# Patient Record
Sex: Male | Born: 1955
Health system: Southern US, Community
[De-identification: ages and names within clinical notes are randomized; demographics above are authoritative.]

## PROBLEM LIST (undated history)

## (undated) DIAGNOSIS — R51 Headache: Secondary | ICD-10-CM

## (undated) DIAGNOSIS — I10 Essential (primary) hypertension: Secondary | ICD-10-CM

## (undated) DIAGNOSIS — C61 Malignant neoplasm of prostate: Secondary | ICD-10-CM

## (undated) DIAGNOSIS — F32A Depression, unspecified: Secondary | ICD-10-CM

## (undated) DIAGNOSIS — J449 Chronic obstructive pulmonary disease, unspecified: Secondary | ICD-10-CM

## (undated) DIAGNOSIS — M1990 Unspecified osteoarthritis, unspecified site: Secondary | ICD-10-CM

## (undated) DIAGNOSIS — R519 Headache, unspecified: Secondary | ICD-10-CM

## (undated) DIAGNOSIS — R7303 Prediabetes: Secondary | ICD-10-CM

## (undated) DIAGNOSIS — I739 Peripheral vascular disease, unspecified: Secondary | ICD-10-CM

## (undated) DIAGNOSIS — G709 Myoneural disorder, unspecified: Secondary | ICD-10-CM

## (undated) DIAGNOSIS — F329 Major depressive disorder, single episode, unspecified: Secondary | ICD-10-CM

## (undated) HISTORY — PX: DIAGNOSTIC LAPAROSCOPY: SUR761

## (undated) HISTORY — PX: WRIST SURGERY: SHX841

## (undated) HISTORY — PX: BACK SURGERY: SHX140

## (undated) HISTORY — PX: APPENDECTOMY: SHX54

---

## 1998-04-26 ENCOUNTER — Encounter: Admission: RE | Admit: 1998-04-26 | Discharge: 1998-07-25 | Payer: Self-pay | Admitting: Anesthesiology

## 1998-05-29 ENCOUNTER — Encounter: Admission: RE | Admit: 1998-05-29 | Discharge: 1998-08-27 | Payer: Self-pay | Admitting: Anesthesiology

## 1998-08-09 ENCOUNTER — Encounter: Admission: RE | Admit: 1998-08-09 | Discharge: 1998-10-27 | Payer: Self-pay | Admitting: Anesthesiology

## 1998-10-27 ENCOUNTER — Encounter: Admission: RE | Admit: 1998-10-27 | Discharge: 1999-01-25 | Payer: Self-pay | Admitting: Anesthesiology

## 1999-01-30 ENCOUNTER — Encounter: Admission: RE | Admit: 1999-01-30 | Discharge: 1999-04-30 | Payer: Self-pay | Admitting: Anesthesiology

## 1999-06-07 ENCOUNTER — Encounter: Admission: RE | Admit: 1999-06-07 | Discharge: 1999-09-03 | Payer: Self-pay | Admitting: Anesthesiology

## 1999-09-03 ENCOUNTER — Encounter: Admission: RE | Admit: 1999-09-03 | Discharge: 1999-12-02 | Payer: Self-pay | Admitting: Anesthesiology

## 1999-12-25 ENCOUNTER — Encounter: Admission: RE | Admit: 1999-12-25 | Discharge: 2000-03-17 | Payer: Self-pay | Admitting: Anesthesiology

## 2000-03-17 ENCOUNTER — Encounter: Admission: RE | Admit: 2000-03-17 | Discharge: 2000-04-28 | Payer: Self-pay | Admitting: Anesthesiology

## 2000-06-09 ENCOUNTER — Encounter: Admission: RE | Admit: 2000-06-09 | Discharge: 2000-09-07 | Payer: Self-pay | Admitting: Anesthesiology

## 2000-09-17 ENCOUNTER — Encounter: Admission: RE | Admit: 2000-09-17 | Discharge: 2000-12-16 | Payer: Self-pay | Admitting: Anesthesiology

## 2001-01-14 ENCOUNTER — Encounter: Admission: RE | Admit: 2001-01-14 | Discharge: 2001-04-14 | Payer: Self-pay | Admitting: Anesthesiology

## 2001-06-03 ENCOUNTER — Encounter: Admission: RE | Admit: 2001-06-03 | Discharge: 2001-06-13 | Payer: Self-pay | Admitting: Anesthesiology

## 2002-11-29 ENCOUNTER — Encounter: Payer: Self-pay | Admitting: Occupational Medicine

## 2002-11-29 ENCOUNTER — Encounter: Admission: RE | Admit: 2002-11-29 | Discharge: 2002-11-29 | Payer: Self-pay | Admitting: Occupational Medicine

## 2011-07-02 ENCOUNTER — Other Ambulatory Visit: Payer: Self-pay | Admitting: Gastroenterology

## 2011-07-02 ENCOUNTER — Ambulatory Visit (HOSPITAL_COMMUNITY)
Admission: RE | Admit: 2011-07-02 | Discharge: 2011-07-02 | Disposition: A | Discharge status: Home / Self Care | Payer: 59 | Source: Ambulatory Visit | Attending: Gastroenterology | Admitting: Gastroenterology

## 2011-07-02 DIAGNOSIS — Z884 Allergy status to anesthetic agent status: Secondary | ICD-10-CM | POA: Insufficient documentation

## 2011-07-02 DIAGNOSIS — D126 Benign neoplasm of colon, unspecified: Secondary | ICD-10-CM | POA: Insufficient documentation

## 2011-07-02 DIAGNOSIS — Z79899 Other long term (current) drug therapy: Secondary | ICD-10-CM | POA: Insufficient documentation

## 2011-07-02 DIAGNOSIS — Z1211 Encounter for screening for malignant neoplasm of colon: Secondary | ICD-10-CM | POA: Insufficient documentation

## 2011-07-14 NOTE — Op Note (Signed)
  NAME:  Tyrone [name], KARIM NO.:  [id number]  MEDICAL RECORD NO.:  [id number]  LOCATION:  [facility]                         FACILITY:  [facility]  PHYSICIAN:  Danise Edge, M.D.   DATE OF BIRTH:  January 05, 1956  DATE OF PROCEDURE:  07/02/2011 DATE OF DISCHARGE:                              OPERATIVE REPORT   REFERRING PHYSICIAN:  Georgann Housekeeper, MD  PROCEDURE:  Screening colonoscopy.  HISTORY:  Mr. [name] is a 55 year old male scheduled to undergo his first screening colonoscopy with polypectomy to prevent colon cancer.  The patient is allergic to fentanyl.  He chronically takes methadone.  He is to receive propofol sedation for the procedure.  PROCEDURE IN DETAIL:  The patient was placed in the left lateral decubitus position.  Anal inspection and digital rectal exam were normal.  The Pentax pediatric colonoscope was introduced into the rectum and advanced to the cecum.  Normal-appearing ileocecal valve and appendiceal orifice were identified.  Colonic preparation for the exam today was good.  Rectum:  From the midrectum, a 1-cm pedunculated polyp was removed with the electrocautery snare and submitted for pathological interpretation. A 5-mm distal rectal polyp was removed with the cold biopsy forceps.  Sigmoid colon:  Normal.  Descending colon:  From the descending colon, two 5-mm polyps were removed with a cold snare and submitted along with the small distal rectal polyp for pathological evaluation.  Splenic flexure:  Normal.  Transverse colon:  Normal.  Hepatic flexure:  Normal.  Ascending colon:  Normal.  Cecum and ileocecal valve:  Normal.  ASSESSMENT: 1. A 1-cm pedunculated polyp was removed from the midrectum with the     hot snare. 2. A diminutive distal rectal polyp and 2 diminutive descending colon     polyps were removed with the cold snare and submitted in one bottle     for pathological evaluation.     ______________________________ Danise Edge, M.D.     MJ/MEDQ  D:  07/02/2011  T:  07/02/2011  Job:  [id number]  cc:   Georgann Housekeeper, MD Fax: [phone]  Electronically Signed by Danise Edge M.D. on 07/14/2011 01:43:59 PM

## 2013-06-24 ENCOUNTER — Emergency Department (HOSPITAL_COMMUNITY)
Admission: EM | Admit: 2013-06-24 | Discharge: 2013-06-25 | Disposition: A | Discharge status: Home / Self Care | Payer: Managed Care, Other (non HMO) | Attending: Emergency Medicine | Admitting: Emergency Medicine

## 2013-06-24 ENCOUNTER — Encounter (HOSPITAL_COMMUNITY): Payer: Self-pay | Admitting: *Deleted

## 2013-06-24 DIAGNOSIS — IMO0002 Reserved for concepts with insufficient information to code with codable children: Secondary | ICD-10-CM | POA: Insufficient documentation

## 2013-06-24 DIAGNOSIS — F17200 Nicotine dependence, unspecified, uncomplicated: Secondary | ICD-10-CM | POA: Insufficient documentation

## 2013-06-24 DIAGNOSIS — R000 Tachycardia, unspecified: Secondary | ICD-10-CM | POA: Insufficient documentation

## 2013-06-24 DIAGNOSIS — S90861A Insect bite (nonvenomous), right foot, initial encounter: Secondary | ICD-10-CM

## 2013-06-24 DIAGNOSIS — Y9289 Other specified places as the place of occurrence of the external cause: Secondary | ICD-10-CM | POA: Insufficient documentation

## 2013-06-24 DIAGNOSIS — Y9389 Activity, other specified: Secondary | ICD-10-CM | POA: Insufficient documentation

## 2013-06-24 LAB — COMPREHENSIVE METABOLIC PANEL
AST: 27 U/L (ref 0–37)
Albumin: 4.3 g/dL (ref 3.5–5.2)
Alkaline Phosphatase: 92 U/L (ref 39–117)
Chloride: 102 mEq/L (ref 96–112)
Potassium: 4.3 mEq/L (ref 3.5–5.1)
Total Bilirubin: 0.3 mg/dL (ref 0.3–1.2)
Total Protein: 7.6 g/dL (ref 6.0–8.3)

## 2013-06-24 LAB — CBC
Platelets: 289 10*3/uL (ref 150–400)
RDW: 12.7 % (ref 11.5–15.5)
WBC: 12.6 10*3/uL — ABNORMAL HIGH (ref 4.0–10.5)

## 2013-06-24 LAB — PROTIME-INR: INR: 0.96 (ref 0.00–1.49)

## 2013-06-24 LAB — D-DIMER, QUANTITATIVE: D-Dimer, Quant: 0.37 ug/mL-FEU (ref 0.00–0.48)

## 2013-06-24 MED ORDER — HYDROMORPHONE HCL PF 1 MG/ML IJ SOLN
1.0000 mg | Freq: Once | INTRAMUSCULAR | Status: AC
  Administered 2013-06-24: 1 mg via INTRAVENOUS
  Filled 2013-06-24: qty 1

## 2013-06-24 MED ORDER — HYDROMORPHONE HCL PF 1 MG/ML IJ SOLN
1.0000 mg | INTRAMUSCULAR | Status: DC | PRN
  Administered 2013-06-24: 1 mg via INTRAVENOUS
  Filled 2013-06-24: qty 1

## 2013-06-24 MED ORDER — FENTANYL CITRATE 0.05 MG/ML IJ SOLN
100.0000 ug | Freq: Once | INTRAMUSCULAR | Status: AC
  Administered 2013-06-24: 100 ug via INTRAVENOUS
  Filled 2013-06-24: qty 2

## 2013-06-24 NOTE — ED Notes (Signed)
Pt moaning and groaning; yelling out with pain; states is becoming more severe; states took two benadryl and 800mg  motrin prior to arrival

## 2013-06-24 NOTE — ED Notes (Signed)
Pt states he thinks he was bit by a snake to right great toe about 1830; states at first thought was a bee sting but due to severe pain that has developed states thinks is a snake bite; did not see snake

## 2013-06-24 NOTE — ED Provider Notes (Signed)
CSN: [id number]     Arrival date & time 06/24/13  2053 History   First MD Initiated Contact with Patient 06/24/13 2057     No chief complaint on file.  (Consider location/radiation/quality/duration/timing/severity/associated sxs/prior Treatment) The history is provided by the patient and medical records. No language interpreter was used.    Tyrone [name] is a 57 y.o. male  with no known medical history presents to the Emergency Department complaining of acute, persistent, progressively worsening right great toe pain after being bitten at approximately 6 PM. Patient states he was outside in the dark working on his son's air conditioning when he was bitten in the right great toe. He states he was wearing sandals. When he looked there was one lesion on the medial, distal portion of the toe. He states it was painful at the time.  He reports he never saw what bit him.  She did not see a snake but is concerned about it. Associated symptoms include erythema of the distal portion of the great toe.  No makes it better and ice, palpation, walking makes it worse.  Pt denies fever, chills, headache, neck pain, chest pain, shortness of breath, abdominal pain, nausea, vomiting, diarrhea, weakness, dizziness, cramping, dysuria, hematuria.     History reviewed. No pertinent past medical history. Past Surgical History  Procedure Laterality Date  . Wrist surgery     No family history on file. History  Substance Use Topics  . Smoking status: Current Every Day Smoker -- 1.50 packs/day    Types: Cigarettes  . Smokeless tobacco: Not on file  . Alcohol Use: Not on file    Review of Systems  Constitutional: Negative for fever, diaphoresis, appetite change, fatigue and unexpected weight change.  HENT: Negative for mouth sores and neck stiffness.   Eyes: Negative for visual disturbance.  Respiratory: Negative for cough, chest tightness, shortness of breath and wheezing.   Cardiovascular: Negative for chest  pain.  Gastrointestinal: Negative for nausea, vomiting, abdominal pain, diarrhea and constipation.  Endocrine: Negative for polydipsia, polyphagia and polyuria.  Genitourinary: Negative for dysuria, urgency, frequency and hematuria.  Musculoskeletal: Positive for myalgias. Negative for back pain.  Skin: Positive for wound. Negative for rash.  Allergic/Immunologic: Negative for immunocompromised state.  Neurological: Negative for syncope, light-headedness and headaches.  Hematological: Does not bruise/bleed easily.  Psychiatric/Behavioral: Negative for sleep disturbance. The patient is not nervous/anxious.     Allergies  Neurontin  Home Medications   Current Outpatient Rx  Name  Route  Sig  Dispense  Refill  . diphenhydrAMINE (BENADRYL) 25 MG tablet   Oral   Take 50 mg by mouth every 6 (six) hours as needed for itching.          Marland Kitchen ibuprofen (ADVIL,MOTRIN) 800 MG tablet   Oral   Take 800 mg by mouth every 8 (eight) hours as needed for pain.         . methadone (DOLOPHINE) 10 MG tablet   Oral   Take 10 mg by mouth every 8 (eight) hours as needed for pain.          BP 147/73  Pulse 79  Temp(Src) 98 F (36.7 C) (Oral)  Resp 12  Ht 5\' 10"  (1.778 m)  Wt 210 lb (95.255 kg)  BMI 30.13 kg/m2  SpO2 96% Physical Exam  Nursing note and vitals reviewed. Constitutional: He appears well-developed and well-nourished. No distress.  Awake, alert, nontoxic appearance Pt screaming in pain  HENT:  Head: Normocephalic and atraumatic.  Mouth/Throat: Oropharynx is clear and moist. No oropharyngeal exudate.  Eyes: Conjunctivae are normal. Pupils are equal, round, and reactive to light. No scleral icterus.  Neck: Normal range of motion. Neck supple.  Cardiovascular: Regular rhythm, normal heart sounds and intact distal pulses.   No murmur heard. Tachycardia   Pulmonary/Chest: Effort normal and breath sounds normal. No respiratory distress. He has no wheezes.  Abdominal: Soft. Bowel  sounds are normal. He exhibits no distension. There is no tenderness. There is no rebound. Hernia confirmed negative in the right inguinal area and confirmed negative in the left inguinal area.  Genitourinary:  No tenderness of the groin No adenopathy  Musculoskeletal: Normal range of motion. He exhibits no edema.  No swelling of the foot, ankle, calf, thigh No pain in the groin  Lymphadenopathy:    He has no cervical adenopathy.       Right: No inguinal adenopathy present.       Left: No inguinal adenopathy present.  Neurological: He is alert. No cranial nerve deficit. He exhibits normal muscle tone. Coordination normal.  Speech is clear and goal oriented Moves extremities without ataxia  Skin: Skin is warm and dry. He is not diaphoretic. There is erythema.  Mild erythema and swelling of the right great toe Questionable one small lesion to the medial distal aspect of the right great toe just medial to the nail No evidence of duplicate punctures Lesion is not indurated or using  Psychiatric: He has a normal mood and affect.    ED Course  Procedures (including critical care time) Labs Review Labs Reviewed  CBC - Abnormal; Notable for the following:    WBC 12.6 (*)    MCHC 36.1 (*)    All other components within normal limits  COMPREHENSIVE METABOLIC PANEL  PROTIME-INR  APTT  D-DIMER, QUANTITATIVE   Imaging Review No results found.  MDM   1. Insect bite of right foot with local reaction      Tyrone [name] presents with questionable snake bite.  Will give pain control, check labs, measure and repeat.    9:09 PM Foot - 9.5 in Ankle - 9 in Calf - 14.25 in Thigh - 19 in Groin Tenderness? no Wound still oozing? No  10:15PM Foot - 9.5 in Ankle - 9 in Calf - 14.25 in Thigh - 19 in Groin Tenderness? no Wound still oozing? No  11:25 Foot - 9.5 in Ankle - 9 in Calf - 14.25 in Thigh - 19 in Groin Tenderness? no Wound still oozing? No  12:50 AM Foot - 9.5  in Ankle - 9 in Calf - 14.25 in Thigh - 19 in Groin Tenderness? no Wound still oozing? No  Patient without extending erythema, swelling or pain beyond the initial site. Highly doubt snake bite at this point.  Patient has had Dilaudid 2 mg, fentanyl 200 mcg. Patient with mildly elevated white blood cell count of 12.6. All the labs are unremarkable. No evidence of abscess or infection.  Tetanus updated.  Patient will be discharged home with pain medicine and antibiotics.  Wound care instructions given.  Pt given instructions to elevate the extremity.  He is to followup with his primary care provider this week.  I have also discussed reasons to return immediately to the ER.  Patient expresses understanding and agrees with plan.  It has been determined that no acute conditions requiring further emergency intervention are present at this time. The patient/guardian have been advised of the diagnosis and plan. We have  discussed signs and symptoms that warrant return to the ED, such as changes or worsening in symptoms.   Vital signs are stable at discharge.   BP 147/73  Pulse 79  Temp(Src) 98 F (36.7 C) (Oral)  Resp 12  Ht 5\' 10"  (1.778 m)  Wt 210 lb (95.255 kg)  BMI 30.13 kg/m2  SpO2 96%  Patient/guardian has voiced understanding and agreed to follow-up with the PCP or specialist.       Dierdre Forth, PA-C 06/25/13 0111

## 2013-06-25 MED ORDER — OXYCODONE-ACETAMINOPHEN 5-325 MG PO TABS: 2.0000 | ORAL_TABLET | ORAL | Status: DC | PRN

## 2013-06-25 MED ORDER — FENTANYL CITRATE 0.05 MG/ML IJ SOLN
100.0000 ug | Freq: Once | INTRAMUSCULAR | Status: AC
  Administered 2013-06-25: 100 ug via INTRAVENOUS
  Filled 2013-06-25: qty 2

## 2013-06-25 MED ORDER — TETANUS-DIPHTH-ACELL PERTUSSIS 5-2.5-18.5 LF-MCG/0.5 IM SUSP
0.5000 mL | Freq: Once | INTRAMUSCULAR | Status: AC
  Administered 2013-06-25: 0.5 mL via INTRAMUSCULAR
  Filled 2013-06-25: qty 0.5

## 2013-06-29 NOTE — ED Provider Notes (Signed)
Medical screening examination/treatment/procedure(s) were performed by non-physician practitioner and as supervising physician I was immediately available for consultation/collaboration.  Lorita Forinash R. Corene Resnick, MD 06/29/13 0703 

## 2015-11-30 ENCOUNTER — Other Ambulatory Visit: Payer: Self-pay | Admitting: Internal Medicine

## 2015-11-30 ENCOUNTER — Ambulatory Visit
Admission: RE | Admit: 2015-11-30 | Discharge: 2015-11-30 | Disposition: A | Discharge status: Home / Self Care | Payer: Managed Care, Other (non HMO) | Source: Ambulatory Visit | Attending: Internal Medicine | Admitting: Internal Medicine

## 2015-11-30 DIAGNOSIS — R0781 Pleurodynia: Secondary | ICD-10-CM

## 2016-01-03 ENCOUNTER — Other Ambulatory Visit: Payer: Self-pay | Admitting: Gastroenterology

## 2016-01-29 ENCOUNTER — Encounter (HOSPITAL_COMMUNITY): Payer: Self-pay | Admitting: *Deleted

## 2016-02-06 ENCOUNTER — Ambulatory Visit (HOSPITAL_COMMUNITY): Payer: Managed Care, Other (non HMO) | Admitting: Anesthesiology

## 2016-02-06 ENCOUNTER — Encounter (HOSPITAL_COMMUNITY): Payer: Self-pay

## 2016-02-06 ENCOUNTER — Ambulatory Visit (HOSPITAL_COMMUNITY)
Admission: RE | Admit: 2016-02-06 | Discharge: 2016-02-06 | Disposition: A | Discharge status: Home / Self Care | Payer: Managed Care, Other (non HMO) | Source: Ambulatory Visit | Attending: Gastroenterology | Admitting: Gastroenterology

## 2016-02-06 ENCOUNTER — Encounter (HOSPITAL_COMMUNITY)
Admission: RE | Disposition: A | Discharge status: Home / Self Care | Payer: Self-pay | Source: Ambulatory Visit | Attending: Gastroenterology

## 2016-02-06 DIAGNOSIS — Z1211 Encounter for screening for malignant neoplasm of colon: Secondary | ICD-10-CM | POA: Diagnosis present

## 2016-02-06 DIAGNOSIS — I10 Essential (primary) hypertension: Secondary | ICD-10-CM | POA: Insufficient documentation

## 2016-02-06 DIAGNOSIS — F17210 Nicotine dependence, cigarettes, uncomplicated: Secondary | ICD-10-CM | POA: Insufficient documentation

## 2016-02-06 DIAGNOSIS — G4733 Obstructive sleep apnea (adult) (pediatric): Secondary | ICD-10-CM | POA: Insufficient documentation

## 2016-02-06 DIAGNOSIS — Z86010 Personal history of colonic polyps: Secondary | ICD-10-CM | POA: Diagnosis not present

## 2016-02-06 DIAGNOSIS — K621 Rectal polyp: Secondary | ICD-10-CM | POA: Diagnosis not present

## 2016-02-06 DIAGNOSIS — Z79899 Other long term (current) drug therapy: Secondary | ICD-10-CM | POA: Insufficient documentation

## 2016-02-06 DIAGNOSIS — E7800 Pure hypercholesterolemia, unspecified: Secondary | ICD-10-CM | POA: Diagnosis not present

## 2016-02-06 DIAGNOSIS — D124 Benign neoplasm of descending colon: Secondary | ICD-10-CM | POA: Diagnosis not present

## 2016-02-06 HISTORY — DX: Major depressive disorder, single episode, unspecified: F32.9

## 2016-02-06 HISTORY — DX: Depression, unspecified: F32.A

## 2016-02-06 HISTORY — DX: Essential (primary) hypertension: I10

## 2016-02-06 HISTORY — DX: Headache, unspecified: R51.9

## 2016-02-06 HISTORY — DX: Headache: R51

## 2016-02-06 HISTORY — PX: COLONOSCOPY WITH PROPOFOL: SHX5780

## 2016-02-06 SURGERY — COLONOSCOPY WITH PROPOFOL
Anesthesia: Monitor Anesthesia Care

## 2016-02-06 MED ORDER — LIDOCAINE HCL (CARDIAC) 20 MG/ML IV SOLN
INTRAVENOUS | Status: AC
Start: 2016-02-06 — End: 2016-02-06
  Filled 2016-02-06: qty 5

## 2016-02-06 MED ORDER — PROPOFOL 10 MG/ML IV BOLUS
INTRAVENOUS | Status: DC | PRN
  Administered 2016-02-06: 10 mg via INTRAVENOUS
  Administered 2016-02-06: 30 mg via INTRAVENOUS
  Administered 2016-02-06 (×3): 20 mg via INTRAVENOUS

## 2016-02-06 MED ORDER — PROPOFOL 10 MG/ML IV BOLUS
INTRAVENOUS | Status: AC
  Filled 2016-02-06: qty 60

## 2016-02-06 MED ORDER — PROPOFOL 500 MG/50ML IV EMUL
INTRAVENOUS | Status: DC | PRN
  Administered 2016-02-06: 150 ug/kg/min via INTRAVENOUS

## 2016-02-06 MED ORDER — SODIUM CHLORIDE 0.9 % IV SOLN: INTRAVENOUS | Status: DC

## 2016-02-06 MED ORDER — LACTATED RINGERS IV SOLN
INTRAVENOUS | Status: DC
  Administered 2016-02-06: 1000 mL via INTRAVENOUS

## 2016-02-06 MED ORDER — LIDOCAINE HCL (CARDIAC) 20 MG/ML IV SOLN
INTRAVENOUS | Status: DC | PRN
  Administered 2016-02-06: 25 mg via INTRATRACHEAL

## 2016-02-06 SURGICAL SUPPLY — 22 items

## 2016-02-06 NOTE — H&P (Signed)
  Procedure: Surveillance colonoscopy. 2012 colonoscopy was performed with removal of a 1 cm tubulovillous adenomatous rectal polyp  History: The patient is a 60 year old male born [birth year]. He is scheduled to undergo a surveillance colonoscopy today.  Past medical history: Mild obstructive sleep apnea syndrome. Hypertension. Hypercholesterolemia. Migraine headache syndrome. Lumbar laminectomy. 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  Medication allergies: Fentanyl  Exam: The patient is alert and lying comfortably on the endoscopy stretcher. Abdomen is soft and nontender to palpation. Lungs are clear to auscultation. Cardiac exam reveals a regular rhythm.  Plan: Proceed with surveillance colonoscopy

## 2016-02-06 NOTE — Anesthesia Postprocedure Evaluation (Signed)
Anesthesia Post Note  Patient: Tyrone [name]  Procedure(s) Performed: Procedure(s) (LRB): COLONOSCOPY WITH PROPOFOL (N/A)  Patient location during evaluation: PACU Anesthesia Type: MAC Level of consciousness: awake and alert Pain management: pain level controlled Vital Signs Assessment: post-procedure vital signs reviewed and stable Respiratory status: spontaneous breathing, nonlabored ventilation, respiratory function stable and patient connected to nasal cannula oxygen Cardiovascular status: stable and blood pressure returned to baseline Anesthetic complications: no    Last Vitals:  Filed Vitals:   02/06/16 1031 02/06/16 1041  BP: 123/66 133/61  Pulse: 73 73  Temp:    Resp: 17 17    Last Pain: There were no vitals filed for this visit.               Raksha Wolfgang J

## 2016-02-06 NOTE — Anesthesia Preprocedure Evaluation (Addendum)
Anesthesia Evaluation  Patient identified by MRN, date of birth, ID band Patient awake    Reviewed: Allergy & Precautions, NPO status , Patient's Chart, lab work & pertinent test results  Airway Mallampati: II  TM Distance: >3 FB Neck ROM: Full    Dental no notable dental hx.    Pulmonary Current Smoker,    Pulmonary exam normal breath sounds clear to auscultation       Cardiovascular hypertension, Pt. on medications Normal cardiovascular exam Rhythm:Regular Rate:Normal     Neuro/Psych  Headaches, PSYCHIATRIC DISORDERS Depression    GI/Hepatic negative GI ROS, Neg liver ROS,   Endo/Other  negative endocrine ROS  Renal/GU negative Renal ROS  negative genitourinary   Musculoskeletal negative musculoskeletal ROS (+)   Abdominal   Peds negative pediatric ROS (+)  Hematology negative hematology ROS (+)   Anesthesia Other Findings   Reproductive/Obstetrics negative OB ROS                             Anesthesia Physical Anesthesia Plan  ASA: II  Anesthesia Plan: MAC   Post-op Pain Management:    Induction: Intravenous  Airway Management Planned: Natural Airway  Additional Equipment:   Intra-op Plan:   Post-operative Plan:   Informed Consent: I have reviewed the patients History and Physical, chart, labs and discussed the procedure including the risks, benefits and alternatives for the proposed anesthesia with the patient or authorized representative who has indicated his/her understanding and acceptance.   Dental advisory given  Plan Discussed with: CRNA  Anesthesia Plan Comments:         Anesthesia Quick Evaluation

## 2016-02-06 NOTE — Discharge Instructions (Signed)
Colonoscopy °A colonoscopy is an exam to look at your colon. This exam can help find lumps (tumors), growths (polyps), bleeding, and redness and puffiness (inflammation) in your colon.  °BEFORE THE PROCEDURE °· Ask your doctor about changing or stopping your regular medicines. °· You may need to drink a large amount of a special liquid (oral bowel prep). You start drinking this the day before your procedure. It will cause you to have watery poop (stool). This cleans out your colon. °· Do not eat or drink anything else once you have started the bowel prep, unless your doctor tells you it is safe to do so. °· Make plans for someone to drive you home after the procedure. °PROCEDURE °· You will be given medicine to help you relax (sedative). °· You will lie on your side with your knees bent. °· A tube with a camera on the end is put in the opening of your butt (anus) and into your colon. Pictures are sent to a computer screen. Your doctor will look for anything that is not normal. °· Your doctor may take a tissue sample (biopsy) from your colon to be looked at more closely. °· The exam is finished when your doctor has viewed all of the colon. °AFTER THE PROCEDURE °· Do not drive for 24 hours after the exam. °· You may have a small amount of blood in your poop. This is normal. °· You may pass gas and have belly (abdominal) cramps. This is normal. °· Ask when your test results will be ready. Make sure you get your test results. °  °This information is not intended to replace advice given to you by your health care provider. Make sure you discuss any questions you have with your health care provider. °  °Document Released: 11/02/2010 Document Revised: 10/05/2013 Document Reviewed: 06/07/2013 °Elsevier Interactive Patient Education ©2016 [employer] ° °

## 2016-02-06 NOTE — Transfer of Care (Signed)
Immediate Anesthesia Transfer of Care Note  Patient: Tyrone [name]  Procedure(s) Performed: Procedure(s): COLONOSCOPY WITH PROPOFOL (N/A)  Patient Location: PACU and Endoscopy Unit  Anesthesia Type:MAC  Level of Consciousness: awake, alert , oriented and patient cooperative  Airway & Oxygen Therapy: Patient Spontanous Breathing and Patient connected to face mask oxygen  Post-op Assessment: Report given to RN and Post -op Vital signs reviewed and stable  Post vital signs: Reviewed and stable  Last Vitals:  Filed Vitals:   02/06/16 0904  BP: 173/73  Pulse: 85  Temp: 36.8 C  Resp: 15    Complications: No apparent anesthesia complications

## 2016-02-06 NOTE — Op Note (Signed)
[facility] Patient Name: Tyrone [name] Procedure Date: 02/06/2016 MRN: [id number] Attending MD: Garlan Fair , MD Date of Birth: 1955/11/03 CSN:  Age: 60 Admit Type: Outpatient Procedure:                Colonoscopy Indications:              High risk colon cancer surveillance: Personal                            history of adenoma with villous component Providers:                Garlan Fair, MD, Sarah Monday, RN, Damontae Dalton, Technician Referring MD:              Medicines:                Propofol per Anesthesia Complications:            No immediate complications. Estimated Blood Loss:     Estimated blood loss: none. Procedure:                Pre-Anesthesia Assessment:                           - Prior to the procedure, a History and Physical                            was performed, and patient medications and                            allergies were reviewed. The patient's tolerance of                            previous anesthesia was also reviewed. The risks                            and benefits of the procedure and the sedation                            options and risks were discussed with the patient.                            All questions were answered, and informed consent                            was obtained. Prior Anticoagulants: The patient has                            taken no previous anticoagulant or antiplatelet                            agents. ASA Grade Assessment: II - A patient with  mild systemic disease. After reviewing the risks                            and benefits, the patient was deemed in                            satisfactory condition to undergo the procedure.                           After obtaining informed consent, the colonoscope                            was passed under direct vision. Throughout the                            procedure, the  patient's blood pressure, pulse, and                            oxygen saturations were monitored continuously. The                            [id number] scope was introduced through                            the anus and advanced to the the cecum, identified                            by appendiceal orifice and ileocecal valve. The                            colonoscopy was technically difficult and complex                            due to significant looping. The patient tolerated                            the procedure well. The quality of the bowel                            preparation was good. The appendiceal orifice and                            the rectum were photographed. Scope In: 9:25:27 AM Scope Out: 10:06:26 AM Scope Withdrawal Time: 0 hours 24 minutes 19 seconds  Total Procedure Duration: 0 hours 40 minutes 59 seconds  Findings:      The perianal and digital rectal examinations were normal.      A 3 mm polyp was found in the proximal descending colon. The polyp was       sessile. The polyp was removed with a cold biopsy forceps. Resection and       retrieval were complete.      A 3 mm polyp was found in the rectum. The polyp was sessile. The polyp       was removed with a cold biopsy forceps. Resection  and retrieval were       complete.      The exam was otherwise without abnormality. Impression:               - One 3 mm polyp in the proximal descending colon,                            removed with a cold biopsy forceps. Resected and                            retrieved.                           - One 3 mm polyp in the rectum, removed with a cold                            biopsy forceps. Resected and retrieved.                           - The examination was otherwise normal. Moderate Sedation:      N/A- Per Anesthesia Care Recommendation:           - Patient has a contact number available for                            emergencies. The signs and  symptoms of potential                            delayed complications were discussed with the                            patient. Return to normal activities tomorrow.                            Written discharge instructions were provided to the                            patient.                           - Repeat colonoscopy in 5 years for surveillance.                           - Resume previous diet.                           - Continue present medications. Procedure Code(s):        --- Professional ---                           [phone], Colonoscopy, flexible; with biopsy, single                            or multiple Diagnosis Code(s):        --- Professional ---  Z86.010, Personal history of colonic polyps                           D12.4, Benign neoplasm of descending colon                           K62.1, Rectal polyp CPT copyright 2016 [employer]. All rights reserved. The codes documented in this report are preliminary and upon coder review may  be revised to meet current compliance requirements. Earle Gell, MD Garlan Fair, MD 02/06/2016 10:13:13 AM This report has been signed electronically. Number of Addenda: 0

## 2016-02-07 ENCOUNTER — Encounter (HOSPITAL_COMMUNITY): Payer: Self-pay | Admitting: Gastroenterology

## 2017-10-23 ENCOUNTER — Other Ambulatory Visit: Payer: Self-pay | Admitting: Internal Medicine

## 2017-10-23 ENCOUNTER — Ambulatory Visit
Admission: RE | Admit: 2017-10-23 | Discharge: 2017-10-23 | Disposition: A | Discharge status: Home / Self Care | Payer: Managed Care, Other (non HMO) | Source: Ambulatory Visit | Attending: Internal Medicine | Admitting: Internal Medicine

## 2017-10-23 DIAGNOSIS — R05 Cough: Secondary | ICD-10-CM

## 2017-10-23 DIAGNOSIS — R059 Cough, unspecified: Secondary | ICD-10-CM

## 2018-01-26 ENCOUNTER — Other Ambulatory Visit (HOSPITAL_COMMUNITY): Payer: Self-pay | Admitting: Respiratory Therapy

## 2018-01-26 DIAGNOSIS — J449 Chronic obstructive pulmonary disease, unspecified: Secondary | ICD-10-CM

## 2018-02-02 ENCOUNTER — Ambulatory Visit (HOSPITAL_COMMUNITY)
Admission: RE | Admit: 2018-02-02 | Discharge: 2018-02-02 | Disposition: A | Discharge status: Home / Self Care | Payer: Managed Care, Other (non HMO) | Source: Ambulatory Visit | Attending: Internal Medicine | Admitting: Internal Medicine

## 2018-02-02 DIAGNOSIS — J449 Chronic obstructive pulmonary disease, unspecified: Secondary | ICD-10-CM

## 2018-02-02 LAB — PULMONARY FUNCTION TEST
DL/VA % pred: 66 %
DL/VA: 3.09 ml/min/mmHg/L
DLCO unc % pred: 54 %
DLCO unc: 17.56 ml/min/mmHg
FEF 25-75 PRE: 0.8 L/s
FEF2575-%PRED-PRE: 27 %
FEV1-%PRED-PRE: 59 %
FEV1-Pre: 2.11 L
FEV1FVC-%PRED-PRE: 76 %
FEV6-%Pred-Pre: 73 %
FEV6-Pre: 3.27 L
FEV6FVC-%Pred-Pre: 96 %
FVC-%Pred-Pre: 78 %
FVC-Pre: 3.67 L
PRE FEV1/FVC RATIO: 57 %
Pre FEV6/FVC Ratio: 92 %

## 2018-03-02 ENCOUNTER — Other Ambulatory Visit: Payer: Self-pay | Admitting: Internal Medicine

## 2018-03-02 ENCOUNTER — Ambulatory Visit
Admission: RE | Admit: 2018-03-02 | Discharge: 2018-03-02 | Disposition: A | Discharge status: Home / Self Care | Payer: Managed Care, Other (non HMO) | Source: Ambulatory Visit | Attending: Internal Medicine | Admitting: Internal Medicine

## 2018-03-02 DIAGNOSIS — M545 Low back pain, unspecified: Secondary | ICD-10-CM

## 2018-06-16 DIAGNOSIS — J209 Acute bronchitis, unspecified: Secondary | ICD-10-CM | POA: Diagnosis not present

## 2018-08-13 ENCOUNTER — Encounter (HOSPITAL_COMMUNITY): Admission: EM | Disposition: A | Discharge status: Home / Self Care | Payer: Self-pay | Source: Home / Self Care

## 2018-08-13 ENCOUNTER — Encounter (HOSPITAL_COMMUNITY): Payer: Self-pay | Admitting: Emergency Medicine

## 2018-08-13 ENCOUNTER — Emergency Department (HOSPITAL_COMMUNITY): Payer: 59 | Admitting: Certified Registered Nurse Anesthetist

## 2018-08-13 ENCOUNTER — Inpatient Hospital Stay (HOSPITAL_COMMUNITY)
Admission: EM | Admit: 2018-08-13 | Discharge: 2018-08-14 | DRG: 340 | Disposition: A | Discharge status: Home / Self Care | Payer: 59 | Attending: Surgery | Admitting: Surgery

## 2018-08-13 ENCOUNTER — Emergency Department (HOSPITAL_COMMUNITY): Payer: 59

## 2018-08-13 DIAGNOSIS — I10 Essential (primary) hypertension: Secondary | ICD-10-CM | POA: Diagnosis not present

## 2018-08-13 DIAGNOSIS — K3532 Acute appendicitis with perforation and localized peritonitis, without abscess: Secondary | ICD-10-CM | POA: Diagnosis present

## 2018-08-13 DIAGNOSIS — F17210 Nicotine dependence, cigarettes, uncomplicated: Secondary | ICD-10-CM | POA: Diagnosis present

## 2018-08-13 DIAGNOSIS — K3533 Acute appendicitis with perforation and localized peritonitis, with abscess: Secondary | ICD-10-CM | POA: Diagnosis not present

## 2018-08-13 DIAGNOSIS — Z79899 Other long term (current) drug therapy: Secondary | ICD-10-CM

## 2018-08-13 DIAGNOSIS — G43909 Migraine, unspecified, not intractable, without status migrainosus: Secondary | ICD-10-CM | POA: Diagnosis present

## 2018-08-13 DIAGNOSIS — K37 Unspecified appendicitis: Secondary | ICD-10-CM | POA: Diagnosis not present

## 2018-08-13 DIAGNOSIS — K3580 Unspecified acute appendicitis: Secondary | ICD-10-CM

## 2018-08-13 DIAGNOSIS — R1031 Right lower quadrant pain: Secondary | ICD-10-CM | POA: Diagnosis not present

## 2018-08-13 DIAGNOSIS — Z888 Allergy status to other drugs, medicaments and biological substances status: Secondary | ICD-10-CM

## 2018-08-13 DIAGNOSIS — F329 Major depressive disorder, single episode, unspecified: Secondary | ICD-10-CM | POA: Diagnosis present

## 2018-08-13 HISTORY — PX: LAPAROSCOPIC APPENDECTOMY: SHX408

## 2018-08-13 LAB — CBC
HCT: 42.3 % (ref 39.0–52.0)
Hemoglobin: 14 g/dL (ref 13.0–17.0)
MCH: 29.9 pg (ref 26.0–34.0)
MCHC: 33.1 g/dL (ref 30.0–36.0)
MCV: 90.2 fL (ref 80.0–100.0)
Platelets: 316 10*3/uL (ref 150–400)
RBC: 4.69 MIL/uL (ref 4.22–5.81)
RDW: 12.2 % (ref 11.5–15.5)
WBC: 13.6 10*3/uL — ABNORMAL HIGH (ref 4.0–10.5)
nRBC: 0 % (ref 0.0–0.2)

## 2018-08-13 LAB — COMPREHENSIVE METABOLIC PANEL
ALBUMIN: 4 g/dL (ref 3.5–5.0)
ALT: 23 U/L (ref 0–44)
AST: 21 U/L (ref 15–41)
Alkaline Phosphatase: 72 U/L (ref 38–126)
Anion gap: 10 (ref 5–15)
BILIRUBIN TOTAL: 1.1 mg/dL (ref 0.3–1.2)
BUN: 15 mg/dL (ref 8–23)
CO2: 28 mmol/L (ref 22–32)
Calcium: 8.5 mg/dL — ABNORMAL LOW (ref 8.9–10.3)
Chloride: 100 mmol/L (ref 98–111)
Creatinine, Ser: 1.04 mg/dL (ref 0.61–1.24)
GFR calc Af Amer: >60 mL/min (ref >60)
GFR calc non Af Amer: >60 mL/min (ref >60)
Glucose, Bld: 107 mg/dL — ABNORMAL HIGH (ref 70–99)
POTASSIUM: 3.5 mmol/L (ref 3.5–5.1)
Sodium: 138 mmol/L (ref 135–145)
Total Protein: 7.6 g/dL (ref 6.5–8.1)

## 2018-08-13 LAB — LIPASE, BLOOD: Lipase: 53 U/L — ABNORMAL HIGH (ref 11–51)

## 2018-08-13 SURGERY — APPENDECTOMY, LAPAROSCOPIC
Anesthesia: General | Site: Abdomen

## 2018-08-13 MED ORDER — IOPAMIDOL (ISOVUE-300) INJECTION 61%
100.0000 mL | Freq: Once | INTRAVENOUS | Status: AC | PRN
  Administered 2018-08-13: 100 mL via INTRAVENOUS

## 2018-08-13 MED ORDER — PIPERACILLIN-TAZOBACTAM 3.375 G IVPB 30 MIN
3.3750 g | Freq: Once | INTRAVENOUS | Status: AC
  Administered 2018-08-13: 3.375 g via INTRAVENOUS

## 2018-08-13 MED ORDER — IOPAMIDOL (ISOVUE-300) INJECTION 61%
INTRAVENOUS | Status: AC
  Filled 2018-08-13: qty 100

## 2018-08-13 MED ORDER — FENTANYL CITRATE (PF) 100 MCG/2ML IJ SOLN
INTRAMUSCULAR | Status: DC | PRN
  Administered 2018-08-13: 100 ug via INTRAVENOUS
  Administered 2018-08-13: 50 ug via INTRAVENOUS
  Administered 2018-08-13: 100 ug via INTRAVENOUS
  Administered 2018-08-14: 50 ug via INTRAVENOUS

## 2018-08-13 MED ORDER — LIDOCAINE 2% (20 MG/ML) 5 ML SYRINGE
INTRAMUSCULAR | Status: DC | PRN
  Administered 2018-08-13: 60 mg via INTRAVENOUS

## 2018-08-13 MED ORDER — MIDAZOLAM HCL 5 MG/5ML IJ SOLN
INTRAMUSCULAR | Status: DC | PRN
  Administered 2018-08-13: 2 mg via INTRAVENOUS

## 2018-08-13 MED ORDER — MORPHINE SULFATE (PF) 4 MG/ML IV SOLN
4.0000 mg | Freq: Once | INTRAVENOUS | Status: AC
  Administered 2018-08-13: 4 mg via INTRAVENOUS
  Filled 2018-08-13: qty 1

## 2018-08-13 MED ORDER — PROMETHAZINE HCL 25 MG/ML IJ SOLN: 6.2500 mg | INTRAMUSCULAR | Status: DC | PRN

## 2018-08-13 MED ORDER — MIDAZOLAM HCL 2 MG/2ML IJ SOLN
INTRAMUSCULAR | Status: AC
  Filled 2018-08-13: qty 2

## 2018-08-13 MED ORDER — ROCURONIUM BROMIDE 10 MG/ML (PF) SYRINGE
PREFILLED_SYRINGE | INTRAVENOUS | Status: DC | PRN
  Administered 2018-08-13: 50 mg via INTRAVENOUS

## 2018-08-13 MED ORDER — PROPOFOL 10 MG/ML IV BOLUS
INTRAVENOUS | Status: DC | PRN
  Administered 2018-08-13: 150 mg via INTRAVENOUS

## 2018-08-13 MED ORDER — LACTATED RINGERS IV SOLN
INTRAVENOUS | Status: AC | PRN
  Administered 2018-08-13 – 2018-08-14 (×2): 1000 mL

## 2018-08-13 MED ORDER — MEPERIDINE HCL 50 MG/ML IJ SOLN: 6.2500 mg | INTRAMUSCULAR | Status: DC | PRN

## 2018-08-13 MED ORDER — FENTANYL CITRATE (PF) 250 MCG/5ML IJ SOLN
INTRAMUSCULAR | Status: AC
  Filled 2018-08-13: qty 5

## 2018-08-13 MED ORDER — HYDROMORPHONE HCL 1 MG/ML IJ SOLN
0.2500 mg | INTRAMUSCULAR | Status: DC | PRN
  Administered 2018-08-14 (×3): 0.5 mg via INTRAVENOUS

## 2018-08-13 MED ORDER — 0.9 % SODIUM CHLORIDE (POUR BTL) OPTIME
TOPICAL | Status: DC | PRN
  Administered 2018-08-13: 1000 mL

## 2018-08-13 MED ORDER — SODIUM CHLORIDE 0.9 % IJ SOLN
INTRAMUSCULAR | Status: AC
  Filled 2018-08-13: qty 50

## 2018-08-13 MED ORDER — BUPIVACAINE HCL (PF) 0.25 % IJ SOLN
INTRAMUSCULAR | Status: AC
  Filled 2018-08-13: qty 30

## 2018-08-13 MED ORDER — PIPERACILLIN-TAZOBACTAM 3.375 G IVPB
INTRAVENOUS | Status: AC
  Filled 2018-08-13: qty 50

## 2018-08-13 MED ORDER — ONDANSETRON HCL 4 MG/2ML IJ SOLN
4.0000 mg | Freq: Once | INTRAMUSCULAR | Status: AC | PRN
  Administered 2018-08-14: 4 mg via INTRAVENOUS

## 2018-08-13 MED ORDER — LACTATED RINGERS IV SOLN
INTRAVENOUS | Status: DC | PRN
  Administered 2018-08-13 – 2018-08-14 (×2): via INTRAVENOUS

## 2018-08-13 MED ORDER — OXYCODONE HCL 5 MG PO TABS: 5.0000 mg | ORAL_TABLET | Freq: Once | ORAL | Status: DC | PRN

## 2018-08-13 MED ORDER — OXYCODONE HCL 5 MG/5ML PO SOLN
5.0000 mg | Freq: Once | ORAL | Status: DC | PRN
  Filled 2018-08-13: qty 5

## 2018-08-13 MED ORDER — FENTANYL CITRATE (PF) 100 MCG/2ML IJ SOLN
INTRAMUSCULAR | Status: AC
  Filled 2018-08-13: qty 2

## 2018-08-13 SURGICAL SUPPLY — 43 items
ADH SKN CLS APL DERMABOND .7 (GAUZE/BANDAGES/DRESSINGS)
APL SKNCLS STERI-STRIP NONHPOA (GAUZE/BANDAGES/DRESSINGS)
APPLIER CLIP ROT 10 11.4 M/L (STAPLE)
APR CLP MED LRG 11.4X10 (STAPLE)
BAG SPEC RTRVL LRG 6X4 10 (ENDOMECHANICALS) ×1
BENZOIN TINCTURE PRP APPL 2/3 (GAUZE/BANDAGES/DRESSINGS) IMPLANT
CABLE HIGH FREQUENCY MONO STRZ (ELECTRODE) ×3 IMPLANT
CHLORAPREP W/TINT 26ML (MISCELLANEOUS) ×3 IMPLANT
CLIP APPLIE ROT 10 11.4 M/L (STAPLE) IMPLANT
CLOSURE WOUND 1/2 X4 (GAUZE/BANDAGES/DRESSINGS)
COVER SURGICAL LIGHT HANDLE (MISCELLANEOUS) ×3 IMPLANT
COVER WAND RF STERILE (DRAPES) ×2 IMPLANT
CUTTER FLEX LINEAR 45M (STAPLE) IMPLANT
DECANTER SPIKE VIAL GLASS SM (MISCELLANEOUS) ×3 IMPLANT
DERMABOND ADVANCED (GAUZE/BANDAGES/DRESSINGS)
DERMABOND ADVANCED .7 DNX12 (GAUZE/BANDAGES/DRESSINGS) ×1 IMPLANT
DRAPE LAPAROSCOPIC ABDOMINAL (DRAPES) ×1 IMPLANT
ELECT REM PT RETURN 15FT ADLT (MISCELLANEOUS) ×3 IMPLANT
ENDOLOOP SUT PDS II  0 18 (SUTURE)
ENDOLOOP SUT PDS II 0 18 (SUTURE) IMPLANT
GLOVE SURG SIGNA 7.5 PF LTX (GLOVE) ×7 IMPLANT
GOWN STRL REUS W/TWL XL LVL3 (GOWN DISPOSABLE) ×8 IMPLANT
KIT BASIN OR (CUSTOM PROCEDURE TRAY) ×3 IMPLANT
POUCH SPECIMEN RETRIEVAL 10MM (ENDOMECHANICALS) ×3 IMPLANT
RELOAD 45 THICK GREEN (ENDOMECHANICALS) ×3 IMPLANT
RELOAD 45 VASCULAR/THIN (ENDOMECHANICALS) IMPLANT
RELOAD STAPLE 45 2.5 WHT GRN (ENDOMECHANICALS) IMPLANT
RELOAD STAPLE 45 3.5 BLU ETS (ENDOMECHANICALS) IMPLANT
RELOAD STAPLE 45 GRN THCK ETS (ENDOMECHANICALS) IMPLANT
RELOAD STAPLE TA45 3.5 REG BLU (ENDOMECHANICALS) IMPLANT
SCISSORS LAP 5X35 DISP (ENDOMECHANICALS) ×3 IMPLANT
SET IRRIG TUBING LAPAROSCOPIC (IRRIGATION / IRRIGATOR) ×3 IMPLANT
SHEARS HARMONIC ACE PLUS 36CM (ENDOMECHANICALS) ×3 IMPLANT
SLEEVE XCEL OPT CAN 5 100 (ENDOMECHANICALS) ×3 IMPLANT
STRIP CLOSURE SKIN 1/2X4 (GAUZE/BANDAGES/DRESSINGS) IMPLANT
SUT MNCRL AB 4-0 PS2 18 (SUTURE) ×3 IMPLANT
SUT VIC AB 2-0 SH 18 (SUTURE) IMPLANT
TOWEL OR 17X26 10 PK STRL BLUE (TOWEL DISPOSABLE) ×3 IMPLANT
TOWEL OR NON WOVEN STRL DISP B (DISPOSABLE) ×1 IMPLANT
TRAY LAPAROSCOPIC (CUSTOM PROCEDURE TRAY) ×3 IMPLANT
TROCAR BLADELESS OPT 5 100 (ENDOMECHANICALS) ×3 IMPLANT
TROCAR XCEL 12X100 BLDLESS (ENDOMECHANICALS) ×2 IMPLANT
TROCAR XCEL BLUNT TIP 100MML (ENDOMECHANICALS) ×3 IMPLANT

## 2018-08-13 NOTE — Anesthesia Preprocedure Evaluation (Addendum)
Anesthesia Evaluation  Patient identified by MRN, date of birth, ID band Patient awake    Reviewed: Allergy & Precautions, NPO status , Patient's Chart, lab work & pertinent test results  Airway Mallampati: II  TM Distance: >3 FB Neck ROM: Full    Dental no notable dental hx.    Pulmonary Current Smoker,    Pulmonary exam normal breath sounds clear to auscultation       Cardiovascular hypertension, Pt. on medications Normal cardiovascular exam Rhythm:Regular Rate:Normal     Neuro/Psych  Headaches, PSYCHIATRIC DISORDERS Depression    GI/Hepatic negative GI ROS, Neg liver ROS,   Endo/Other  negative endocrine ROS  Renal/GU negative Renal ROS  negative genitourinary   Musculoskeletal negative musculoskeletal ROS (+)   Abdominal   Peds negative pediatric ROS (+)  Hematology negative hematology ROS (+)   Anesthesia Other Findings   Reproductive/Obstetrics negative OB ROS                             Anesthesia Physical  Anesthesia Plan  ASA: II and emergent  Anesthesia Plan: General   Post-op Pain Management:    Induction: Intravenous, Rapid sequence and Cricoid pressure planned  PONV Risk Score and Plan: 1 and Ondansetron  Airway Management Planned: Oral ETT  Additional Equipment:   Intra-op Plan:   Post-operative Plan: Extubation in OR  Informed Consent: I have reviewed the patients History and Physical, chart, labs and discussed the procedure including the risks, benefits and alternatives for the proposed anesthesia with the patient or authorized representative who has indicated his/her understanding and acceptance.   Dental advisory given  Plan Discussed with: CRNA  Anesthesia Plan Comments:        Anesthesia Quick Evaluation

## 2018-08-13 NOTE — Anesthesia Procedure Notes (Signed)
Procedure Name: Intubation Performed by: Manuel Lawhead J, CRNA Pre-anesthesia Checklist: Patient identified, Emergency Drugs available, Suction available, Patient being monitored and Timeout performed Patient Re-evaluated:Patient Re-evaluated prior to induction Oxygen Delivery Method: Circle system utilized Preoxygenation: Pre-oxygenation with 100% oxygen Induction Type: IV induction Ventilation: Mask ventilation without difficulty Laryngoscope Size: Mac and 4 Grade View: Grade I Tube type: Oral Tube size: 7.5 mm Number of attempts: 1 Airway Equipment and Method: Stylet Placement Confirmation: ETT inserted through vocal cords under direct vision,  positive ETCO2 and breath sounds checked- equal and bilateral Secured at: 23 cm Tube secured with: Tape Dental Injury: Teeth and Oropharynx as per pre-operative assessment        

## 2018-08-13 NOTE — H&P (Addendum)
Re:   Tyrone [name] DOB:   1956-05-21 MRN:   [id number]  Chief Complaint Abdominal pain  ASSESEMENT AND PLAN: 1.  Appendicitis  I discussed with the patient the indications and risks of appendiceal surgery.  The primary risks of appendiceal surgery include, but are not limited to, bleeding, infection, bowel surgery, and open surgery.  There is also the risk that the patient may have continued symptoms after surgery.  We discussed the typical post-operative recovery course. I tried to answer the patient's questions.  2.  Smokes - 1 1/2 ppd 3.  Migraine HA - 2 to 4 times per year 4.  Depression  Chief Complaint  Patient presents with  . Abdominal Pain   PHYSICIAN REQUESTING CONSULTATION: Pearlie Oyster, PA, Karenann Cai  HISTORY OF PRESENT ILLNESS: Tyrone [name] is a 62 y.o. (DOB: 1956/05/02)  white male whose primary care physician is Wenda Low, MD and comes to the [facility] with abdominal pain. He is accompanied by his wife, Tyrone [name].   The patient developed abdominal pain on Monday, October 27.  Pain started in his right abdomen.  He thought he was constipated.  He took some Dulcolax tablets but this did not help the pain.  For the next 2 days, he endured the pain.  Today he went to the walk-in clinic for [facility].  They referred him to the [facility] emergency room.  He has had no prior history of stomach ulcers, liver disease, pancreas disease or colon disease.  He thinks his last colonoscopy was about 3 years ago by Dr. Wynetta Emery.  He has had no prior abdominal surgery.  CT scan of abdomen - 08/13/2018 - 1. Acute appendicitis with the appendix measuring up to 15 mm in diameter at McBurney's point with extensive periappendiceal fatty inflammatory change but without organized abscess or free air. An 8 mm appendicolith is noted at the base of the appendix.  2. Sympathetic thickening of the distal and terminal ileum and base of cecum with a component of small bowel ileus.  3. Simple  right upper pole renal cyst.  4. Degenerative disc disease of the lower lumbar spine. WBC - 13,600 - 08/13/2018   Past Medical History:  Diagnosis Date  . Depression   . Headache    migraines  . Hypertension       Past Surgical History:  Procedure Laterality Date  . BACK SURGERY  1980"s   L4 to L 5 laminectomy  . COLONOSCOPY WITH PROPOFOL N/A 02/06/2016   Procedure: COLONOSCOPY WITH PROPOFOL;  Surgeon: Garlan Fair, MD;  Location: [facility] ENDOSCOPY;  Service: Endoscopy;  Laterality: N/A;  . WRIST SURGERY Right    x 2       Current Facility-Administered Medications  Medication Dose Route Frequency Provider Last Rate Last Dose  . ondansetron (ZOFRAN) injection 4 mg  4 mg Intravenous Once PRN Ward, Ozella Almond, PA-C      . sodium chloride 0.9 % injection            Current Outpatient Medications  Medication Sig Dispense Refill  . mirtazapine (REMERON) 15 MG tablet Take 15 mg by mouth at bedtime.    . Multiple Vitamins-Minerals (MULTIVITAMIN ADULT) TABS Take 1 tablet by mouth daily.    . traZODone (DESYREL) 100 MG tablet Take 100 mg by mouth at bedtime as needed for sleep.        Allergies  Allergen Reactions  . Neurontin [Gabapentin] Itching    Confusion and sedation.  REVIEW OF SYSTEMS: Skin:  No history of rash.  No history of abnormal moles. Infection:  No history of hepatitis or HIV.  No history of MRSA. Neurologic:  Migraine HA's about 3 to 4 times per year Cardiac:  No history of hypertension. No history of heart disease.  No history of prior cardiac catheterization.  No history of seeing a cardiologist. Pulmonary:  Smokes 1 1/2 ppd  Endocrine:  No diabetes. No thyroid disease. Gastrointestinal:  See HPI Urologic:  No history of kidney stones.  No history of bladder infections. Musculoskeletal:  No history of joint or back disease. Hematologic:  No bleeding disorder.  No history of anemia.  Not anticoagulated. Psycho-social:  The patient is oriented.   The  patient has no obvious psychologic or social impairment to understanding our conversation and plan.  SOCIAL and FAMILY HISTORY: Married.  His wife, Tyrone [name] is with him.    She thought I did her gall bladder surgery over 5 years ago.  Actually, Dr. Marlou Starks did the surgery.  Dr. Dalbert Batman saw her about a month later for mesenteric ischemia. He has 6 children - ages 29 to 20.  PHYSICAL EXAM: BP (!) 133/51   Pulse 82   Temp 98.7 F (37.1 C) (Oral)   Resp (!) 26   SpO2 96%   General: Bearded WM who is alert. Skin:  Inspection and palpation - no mass or rash. Eyes:  Conjunctiva and lids unremarkable.            Pupils are equal Ears, Nose, Mouth, and Throat:  Ears and nose unremarkable            Lips and teeth are unremarable. Neck: Supple. No mass, trachea midline.  No thyroid mass. Lymph Nodes:  No supraclavicular, cervical, or inguinal nodes. Lungs: Normal respiratory effort.  Symmetric breath sounds.  He has a cough. Heart:  Palpation of the heart is normal.            Auscultation: RRR. No murmur or rub.  Abdomen: Soft. No mass.  No hernia.             No abdominal scars.  He is tender in the RLQ.  He has guarding and reboud. Rectal: Not done. Musculoskeletal:  Good muscle strength and ROM  in upper and lower extremities.  Neurologic:  Grossly intact to motor and sensory function. Psychiatric: Normal judgement and insight. Behavior is normal.            Oriented to time, person, place.   DATA REVIEWED, COUNSELING AND COORDINATION OF CARE: Epic notes reviewed. Counseling and coordination of care exceeded more than 50% of the time spent with patient. Total time spent with patient and charting: 45 minutes.  Alphonsa Overall, MD,  [facility], [address]  [place], [place]    [place] Phone:  [phone] FAX:  [phone]

## 2018-08-13 NOTE — ED Notes (Signed)
Patient made aware to remain NPO.

## 2018-08-13 NOTE — ED Triage Notes (Signed)
Pt sent from [facility] for right side abd pains and needing CT scan.

## 2018-08-13 NOTE — ED Notes (Signed)
Surgeon at bedside.  

## 2018-08-13 NOTE — ED Provider Notes (Signed)
[facility] DEPT Provider Note   CSN: [id number] Arrival date & time: 08/13/18  1532     History   Chief Complaint Chief Complaint  Patient presents with  . Abdominal Pain    HPI Tyrone [name] is a 62 y.o. male.  The history is provided by the patient and medical records. No language interpreter was used.  Abdominal Pain   Associated symptoms include nausea. Pertinent negatives include diarrhea, vomiting and constipation.   Tyrone [name] is a 62 y.o. male  with a PMH as listed below who presents to the Emergency Department from primary care office for right-sided abdominal pain.  Patient states that he started having abdominal pain on Monday.  At first is thought he had an upset stomach, however pain is progressively worsened.  Mostly to the right lower quadrant.  Associated with nausea, but no vomiting.  No changes in bowel habits.  No fever or chills.  Last ate last night.  He did have a bottle of water around noon.  He saw his primary care doctor today who recommended that he come to the emergency department.  They told him he needed a CT scan because they thought he may have appendicitis or a bowel obstruction.   Past Medical History:  Diagnosis Date  . Depression   . Headache    migraines  . Hypertension     There are no active problems to display for this patient.   Past Surgical History:  Procedure Laterality Date  . BACK SURGERY  1980"s   L4 to L 5 laminectomy  . COLONOSCOPY WITH PROPOFOL N/A 02/06/2016   Procedure: COLONOSCOPY WITH PROPOFOL;  Surgeon: Garlan Fair, MD;  Location: [facility] END[facility]COPY;  Service: Endoscopy;  Laterality: N/A;  . WRIST SURGERY Right    x 2         Home Medications    Prior to Admission medications   Medication Sig Start Date End Date Taking? Authorizing Provider  mirtazapine (REMERON) 15 MG tablet Take 15 mg by mouth at bedtime.   Yes [provider]  Multiple Vitamins-Minerals  (MULTIVITAMIN ADULT) TABS Take 1 tablet by mouth daily.   Yes [provider]  traZODone (DESYREL) 100 MG tablet Take 100 mg by mouth at bedtime as needed for sleep.   Yes [provider]    Family History History reviewed. No pertinent family history.  Social History Social History   Tobacco Use  . Smoking status: Current Every Day Smoker    Packs/day: 1.50    Years: 45.00    Pack years: 67.50    Types: Cigarettes  . Smokeless tobacco: Never Used  Substance Use Topics  . Alcohol use: Yes    Comment: yearly beer  . Drug use: No     Allergies   Neurontin [gabapentin]   Review of Systems Review of Systems  Gastrointestinal: Positive for abdominal pain and nausea. Negative for blood in stool, constipation, diarrhea and vomiting.  All other systems reviewed and are negative.    Physical Exam Updated Vital Signs BP (!) 133/51   Pulse 82   Temp 98.7 F (37.1 C) (Oral)   Resp (!) 26   SpO2 96%   Physical Exam  Constitutional: He is oriented to person, place, and time. He appears well-developed and well-nourished. No distress.  HENT:  Head: Normocephalic and atraumatic.  Cardiovascular: Normal rate, regular rhythm and normal heart sounds.  No murmur heard. Pulmonary/Chest: Effort normal and breath sounds normal. No  respiratory distress.  Abdominal: Soft. He exhibits no distension.  Tenderness to palpation to the right lower quadrant with involuntary guarding.  Musculoskeletal: He exhibits no edema.  Neurological: He is alert and oriented to person, place, and time.  Skin: Skin is warm and dry.  Nursing note and vitals reviewed.    ED Treatments / Results  Labs (all labs ordered are listed, but only abnormal results are displayed) Labs Reviewed  LIPASE, BLOOD - Abnormal; Notable for the following components:      Result Value   Lipase 53 (*)    All other components within normal limits  COMPREHENSIVE METABOLIC PANEL - Abnormal; Notable for  the following components:   Glucose, Bld 107 (*)    Calcium 8.5 (*)    All other components within normal limits  CBC - Abnormal; Notable for the following components:   WBC 13.6 (*)    All other components within normal limits  URINALYSIS, ROUTINE W REFLEX MICROSCOPIC    EKG None  Radiology Ct Abdomen Pelvis W Contrast  Result Date: 08/13/2018 CLINICAL DATA:  Right lower quadrant pain since Monday with nausea. EXAM: CT ABDOMEN AND PELVIS WITH CONTRAST TECHNIQUE: Multidetector CT imaging of the abdomen and pelvis was performed using the standard protocol following bolus administration of intravenous contrast. CONTRAST:  <See Chart> ISOVUE-300 IOPAMIDOL (ISOVUE-300) INJECTION 61% COMPARISON:  None. FINDINGS: Lower chest: Normal heart size without pericardial effusion. Subpleural atelectasis and/or scarring at each lung base. Minimal fibrosis is also suggested bilaterally at each lung base. Hepatobiliary: Nondistended gallbladder without stones or secondary signs of acute cholecystitis. Small fold noted near the gallbladder neck simulating a stone. Homogeneous appearance of the liver without space-occupying mass. Pancreas: Normal Spleen: Normal Adrenals/Urinary Tract: Normal bilateral adrenal glands. Simple cyst in the upper pole the right kidney measuring 1.5 cm. No nephrolithiasis nor hydroureteronephrosis. Normal bladder. Stomach/Bowel: Appendix: Location: McBurney's point Diameter: 15 mm Appendicolith: 8 mm appendicolith at its base Mucosal hyper-enhancement: Present Extraluminal gas: None Periappendiceal collection: Extensive periappendiceal inflammation is identified in the right lower quadrant without organized abscess. Mild fluid-filled distended jejunal loops suspicious for small bowel ileus. Sympathetic thickening of the cecum distal ileum. No mechanical bowel obstruction. Vascular/Lymphatic: Moderate aortoiliac atherosclerosis. No pathologically enlarged lymph nodes. Reproductive: Normal  size prostate Other: No free air or free fluid. Musculoskeletal: Degenerative disc disease L4-5 and L5-S1 without acute nor suspicious osseous lesions. IMPRESSION: 1. Acute appendicitis with the appendix measuring up to 15 mm in diameter at McBurney's point with extensive periappendiceal fatty inflammatory change but without organized abscess or free air. An 8 mm appendicolith is noted at the base of the appendix. 2. Sympathetic thickening of the distal and terminal ileum and base of cecum with a component of small bowel ileus. 3. Simple right upper pole renal cyst. 4. Degenerative disc disease of the lower lumbar spine. Electronically Signed   By: Ashley Royalty M.D.   On: 08/13/2018 21:00    Procedures Procedures (including critical care time)  Medications Ordered in ED Medications  ondansetron (ZOFRAN) injection 4 mg (has no administration in time range)  sodium chloride 0.9 % injection (has no administration in time range)  morphine 4 MG/ML injection 4 mg (4 mg Intravenous Given 08/13/18 1951)  iopamidol (ISOVUE-300) 61 % injection 100 mL (100 mLs Intravenous Contrast Given 08/13/18 2037)     Initial Impression / Assessment and Plan / ED Course  I have reviewed the triage vital signs and the nursing notes.  Pertinent labs & imaging results that  were available during my care of the patient were reviewed by me and considered in my medical decision making (see chart for details).    Tyrone [name] is a 62 y.o. male who presents to ED for right lower quadrant abdominal pain over the last 4 days.  On exam, patient is afebrile, hemodynamically stable with focal tenderness to the right lower quadrant.  Labs reviewed and notable for leukocytosis of 13.6.  He does have a minimally elevated lipase at 53. Would expect higher value if pancreatitis and history not very consistent with pancreatitis either. CT obtained showing findings concerning for acute appendicitis. General surgery consulted who will  admit.    Final Clinical Impressions(s) / ED Diagnoses   Final diagnoses:  Acute appendicitis, unspecified acute appendicitis type    ED Discharge Orders    None       Khamya Topp, Ozella Almond, PA-C 08/13/18 2305    Isla Pence, MD 08/13/18 2330

## 2018-08-14 ENCOUNTER — Encounter (HOSPITAL_COMMUNITY): Payer: Self-pay | Admitting: Surgery

## 2018-08-14 ENCOUNTER — Other Ambulatory Visit: Payer: Self-pay

## 2018-08-14 DIAGNOSIS — K37 Unspecified appendicitis: Secondary | ICD-10-CM | POA: Diagnosis present

## 2018-08-14 DIAGNOSIS — F329 Major depressive disorder, single episode, unspecified: Secondary | ICD-10-CM | POA: Diagnosis present

## 2018-08-14 DIAGNOSIS — G43909 Migraine, unspecified, not intractable, without status migrainosus: Secondary | ICD-10-CM | POA: Diagnosis not present

## 2018-08-14 DIAGNOSIS — Z79899 Other long term (current) drug therapy: Secondary | ICD-10-CM | POA: Diagnosis not present

## 2018-08-14 DIAGNOSIS — K3532 Acute appendicitis with perforation and localized peritonitis, without abscess: Secondary | ICD-10-CM | POA: Diagnosis present

## 2018-08-14 DIAGNOSIS — Z888 Allergy status to other drugs, medicaments and biological substances status: Secondary | ICD-10-CM | POA: Diagnosis not present

## 2018-08-14 DIAGNOSIS — F17210 Nicotine dependence, cigarettes, uncomplicated: Secondary | ICD-10-CM | POA: Diagnosis present

## 2018-08-14 DIAGNOSIS — K3580 Unspecified acute appendicitis: Secondary | ICD-10-CM | POA: Diagnosis not present

## 2018-08-14 DIAGNOSIS — I10 Essential (primary) hypertension: Secondary | ICD-10-CM | POA: Diagnosis not present

## 2018-08-14 DIAGNOSIS — K3533 Acute appendicitis with perforation and localized peritonitis, with abscess: Secondary | ICD-10-CM | POA: Diagnosis not present

## 2018-08-14 LAB — CBC WITH DIFFERENTIAL/PLATELET
Abs Immature Granulocytes: 0.09 10*3/uL — ABNORMAL HIGH (ref 0.00–0.07)
Basophils Absolute: 0 10*3/uL (ref 0.0–0.1)
Basophils Relative: 0 %
Eosinophils Absolute: 0 10*3/uL (ref 0.0–0.5)
Eosinophils Relative: 0 %
HCT: 39.3 % (ref 39.0–52.0)
Hemoglobin: 13 g/dL (ref 13.0–17.0)
IMMATURE GRANULOCYTES: 1 %
LYMPHS ABS: 0.7 10*3/uL (ref 0.7–4.0)
LYMPHS PCT: 5 %
MCH: 30.1 pg (ref 26.0–34.0)
MCHC: 33.1 g/dL (ref 30.0–36.0)
MCV: 91 fL (ref 80.0–100.0)
Monocytes Absolute: 1.1 10*3/uL — ABNORMAL HIGH (ref 0.1–1.0)
Monocytes Relative: 8 %
NEUTROS ABS: 12 10*3/uL — AB (ref 1.7–7.7)
NRBC: 0 % (ref 0.0–0.2)
Neutrophils Relative %: 86 %
PLATELETS: 289 10*3/uL (ref 150–400)
RBC: 4.32 MIL/uL (ref 4.22–5.81)
RDW: 12 % (ref 11.5–15.5)
WBC: 13.9 10*3/uL — ABNORMAL HIGH (ref 4.0–10.5)

## 2018-08-14 MED ORDER — AMOXICILLIN-POT CLAVULANATE 875-125 MG PO TABS: 1.0000 | ORAL_TABLET | Freq: Two times a day (BID) | ORAL | 0 refills | Status: DC

## 2018-08-14 MED ORDER — MORPHINE SULFATE (PF) 2 MG/ML IV SOLN
1.0000 mg | INTRAVENOUS | Status: DC | PRN
  Administered 2018-08-14: 2 mg via INTRAVENOUS
  Filled 2018-08-14: qty 1

## 2018-08-14 MED ORDER — BUPIVACAINE HCL (PF) 0.25 % IJ SOLN
INTRAMUSCULAR | Status: DC | PRN
  Administered 2018-08-14: 30 mL

## 2018-08-14 MED ORDER — KCL IN DEXTROSE-NACL 20-5-0.45 MEQ/L-%-% IV SOLN
INTRAVENOUS | Status: DC
  Administered 2018-08-14 (×2): via INTRAVENOUS
  Filled 2018-08-14 (×2): qty 1000

## 2018-08-14 MED ORDER — ENOXAPARIN SODIUM 40 MG/0.4ML ~~LOC~~ SOLN
40.0000 mg | SUBCUTANEOUS | Status: DC
  Filled 2018-08-14: qty 0.4

## 2018-08-14 MED ORDER — HYDROMORPHONE HCL 1 MG/ML IJ SOLN
INTRAMUSCULAR | Status: AC
  Filled 2018-08-14: qty 1

## 2018-08-14 MED ORDER — ONDANSETRON 4 MG PO TBDP: 4.0000 mg | ORAL_TABLET | Freq: Four times a day (QID) | ORAL | Status: DC | PRN

## 2018-08-14 MED ORDER — HYDROCODONE-ACETAMINOPHEN 5-325 MG PO TABS
1.0000 | ORAL_TABLET | ORAL | Status: DC | PRN
  Administered 2018-08-14: 2 via ORAL
  Administered 2018-08-14: 1 via ORAL
  Filled 2018-08-14 (×2): qty 2

## 2018-08-14 MED ORDER — HYDROCODONE-ACETAMINOPHEN 5-325 MG PO TABS: 1.0000 | ORAL_TABLET | Freq: Four times a day (QID) | ORAL | 0 refills | Status: DC | PRN

## 2018-08-14 MED ORDER — DOCUSATE SODIUM 100 MG PO CAPS
100.0000 mg | ORAL_CAPSULE | Freq: Two times a day (BID) | ORAL | Status: DC
  Administered 2018-08-14: 100 mg via ORAL
  Filled 2018-08-14: qty 1

## 2018-08-14 MED ORDER — HYDROCODONE-ACETAMINOPHEN 5-325 MG PO TABS
1.0000 | ORAL_TABLET | ORAL | Status: DC | PRN
  Administered 2018-08-14: 2 via ORAL
  Filled 2018-08-14: qty 2

## 2018-08-14 MED ORDER — PIPERACILLIN-TAZOBACTAM 3.375 G IVPB
3.3750 g | Freq: Three times a day (TID) | INTRAVENOUS | Status: DC
  Administered 2018-08-14 (×2): 3.375 g via INTRAVENOUS
  Filled 2018-08-14 (×4): qty 50

## 2018-08-14 MED ORDER — ONDANSETRON HCL 4 MG/2ML IJ SOLN: 4.0000 mg | Freq: Four times a day (QID) | INTRAMUSCULAR | Status: DC | PRN

## 2018-08-14 MED ORDER — SUGAMMADEX SODIUM 200 MG/2ML IV SOLN
INTRAVENOUS | Status: DC | PRN
  Administered 2018-08-14: 200 mg via INTRAVENOUS

## 2018-08-14 MED ORDER — DEXAMETHASONE SODIUM PHOSPHATE 10 MG/ML IJ SOLN
INTRAMUSCULAR | Status: DC | PRN
  Administered 2018-08-14: 5 mg via INTRAVENOUS

## 2018-08-14 MED ORDER — MORPHINE SULFATE (PF) 2 MG/ML IV SOLN: 1.0000 mg | INTRAVENOUS | Status: DC | PRN

## 2018-08-14 MED ORDER — POLYETHYLENE GLYCOL 3350 17 G PO PACK: 17.0000 g | PACK | Freq: Every day | ORAL | Status: DC | PRN

## 2018-08-14 MED ORDER — ACETAMINOPHEN 325 MG PO TABS
650.0000 mg | ORAL_TABLET | Freq: Three times a day (TID) | ORAL | Status: DC
  Administered 2018-08-14 (×2): 650 mg via ORAL
  Filled 2018-08-14 (×2): qty 2

## 2018-08-14 MED ORDER — DOCUSATE SODIUM 100 MG PO CAPS: 100.0000 mg | ORAL_CAPSULE | Freq: Two times a day (BID) | ORAL | 0 refills | Status: DC

## 2018-08-14 MED ORDER — TRAMADOL HCL 50 MG PO TABS: 50.0000 mg | ORAL_TABLET | Freq: Four times a day (QID) | ORAL | Status: DC | PRN

## 2018-08-14 NOTE — Transfer of Care (Signed)
Immediate Anesthesia Transfer of Care Note  Patient: Tyrone [name]  Procedure(s) Performed: APPENDECTOMY LAPAROSCOPIC (N/A Abdomen)  Patient Location: PACU  Anesthesia Type:General  Level of Consciousness: sedated, patient cooperative and responds to stimulation  Airway & Oxygen Therapy: Patient Spontanous Breathing and Patient connected to face mask oxygen  Post-op Assessment: Report given to RN and Post -op Vital signs reviewed and stable  Post vital signs: Reviewed and stable  Last Vitals:  Vitals Value Taken Time  BP    Temp    Pulse 105 08/14/2018 12:42 AM  Resp 30 08/14/2018 12:42 AM  SpO2 100 % 08/14/2018 12:42 AM  Vitals shown include unvalidated device data.  Last Pain:  Vitals:   08/13/18 2029  TempSrc:   PainSc: 4          Complications: No apparent anesthesia complications

## 2018-08-14 NOTE — Discharge Summary (Signed)
[facility] Discharge Summary   Patient ID: Tyrone [name] MRN: [id number] DOB/AGE: [birth year] 62 y.o.  Admit date: 08/13/2018 Discharge date: 08/14/2018  Admitting Diagnosis: Perforated appendicitis  Discharge Diagnosis Patient Active Problem List   Diagnosis Date Noted  . Appendicitis with perforation 08/14/2018    Consultants None  Imaging: Ct Abdomen Pelvis W Contrast  Result Date: 08/13/2018 CLINICAL DATA:  Right lower quadrant pain since Monday with nausea. EXAM: CT ABDOMEN AND PELVIS WITH CONTRAST TECHNIQUE: Multidetector CT imaging of the abdomen and pelvis was performed using the standard protocol following bolus administration of intravenous contrast. CONTRAST:  <See Chart> ISOVUE-300 IOPAMIDOL (ISOVUE-300) INJECTION 61% COMPARISON:  None. FINDINGS: Lower chest: Normal heart size without pericardial effusion. Subpleural atelectasis and/or scarring at each lung base. Minimal fibrosis is also suggested bilaterally at each lung base. Hepatobiliary: Nondistended gallbladder without stones or secondary signs of acute cholecystitis. Small fold noted near the gallbladder neck simulating a stone. Homogeneous appearance of the liver without space-occupying mass. Pancreas: Normal Spleen: Normal Adrenals/Urinary Tract: Normal bilateral adrenal glands. Simple cyst in the upper pole the right kidney measuring 1.5 cm. No nephrolithiasis nor hydroureteronephrosis. Normal bladder. Stomach/Bowel: Appendix: Location: McBurney's point Diameter: 15 mm Appendicolith: 8 mm appendicolith at its base Mucosal hyper-enhancement: Present Extraluminal gas: None Periappendiceal collection: Extensive periappendiceal inflammation is identified in the right lower quadrant without organized abscess. Mild fluid-filled distended jejunal loops suspicious for small bowel ileus. Sympathetic thickening of the cecum distal ileum. No mechanical bowel obstruction. Vascular/Lymphatic: Moderate aortoiliac  atherosclerosis. No pathologically enlarged lymph nodes. Reproductive: Normal size prostate Other: No free air or free fluid. Musculoskeletal: Degenerative disc disease L4-5 and L5-S1 without acute nor suspicious osseous lesions. IMPRESSION: 1. Acute appendicitis with the appendix measuring up to 15 mm in diameter at McBurney's point with extensive periappendiceal fatty inflammatory change but without organized abscess or free air. An 8 mm appendicolith is noted at the base of the appendix. 2. Sympathetic thickening of the distal and terminal ileum and base of cecum with a component of small bowel ileus. 3. Simple right upper pole renal cyst. 4. Degenerative disc disease of the lower lumbar spine. Electronically Signed   By: Ashley Royalty M.D.   On: 08/13/2018 21:00    Procedures Dr. Lucia Gaskins (08/13/18) - Laparoscopic Appendectomy  Hospital Course:  Tyrone [name] is a 62yo male who presented to [facility] 10/31 with 4 days of right sided abdominal pain.  Workup included a CT scan which showed acute appendicitis.  Patient was admitted and underwent procedure listed above. Intraoperatively he was found to have perforated appendicitis. He was kept on zosyn during admission.  Tolerated procedure well and was transferred to the floor.  Diet was advanced as tolerated.  On POD1 the patient was voiding well, tolerating diet, ambulating well, pain well controlled, vital signs stable, incisions c/d/i and felt stable for discharge home.  He will go home with 1 week of augmentin. Patient will follow up as below and knows to call with questions or concerns.      Allergies as of 08/14/2018      Reactions   Neurontin [gabapentin] Itching   Confusion and sedation.      Medication List    TAKE these medications   amoxicillin-clavulanate 875-125 MG tablet Commonly known as:  AUGMENTIN Take 1 tablet by mouth 2 (two) times daily.   docusate sodium 100 MG capsule Commonly known as:  COLACE Take 1 capsule (100 mg total)  by mouth 2 (two) times daily.  HYDROcodone-acetaminophen 5-325 MG tablet Commonly known as:  NORCO/VICODIN Take 1 tablet by mouth every 6 (six) hours as needed for severe pain.   mirtazapine 15 MG tablet Commonly known as:  REMERON Take 15 mg by mouth at bedtime.   MULTIVITAMIN ADULT Tabs Take 1 tablet by mouth daily.   traZODone 100 MG tablet Commonly known as:  DESYREL Take 100 mg by mouth at bedtime as needed for sleep.        Follow-up [facility], [place]. Go on 08/27/2018.   Specialty:  General Surgery Why:  Your appointment is 11/14 at 9:45 am. Please arrive 30 minutes prior to your appointment to check in and fill out paperwork. Bring photo ID and insurance information. Contact information: [address] [place] [phone]          Signed: Wellington Hampshire, [facility] 08/14/2018, 3:08 PM Pager: [phone] Mon 7:00 am -11:30 AM Tues-Fri 7:00 am-4:30 pm Sat-Sun 7:00 am-11:30 am

## 2018-08-14 NOTE — Anesthesia Postprocedure Evaluation (Signed)
Anesthesia Post Note  Patient: Tyrone [name]  Procedure(s) Performed: APPENDECTOMY LAPAROSCOPIC (N/A Abdomen)     Patient location during evaluation: PACU Anesthesia Type: General Level of consciousness: awake and alert Pain management: pain level controlled Vital Signs Assessment: post-procedure vital signs reviewed and stable Respiratory status: spontaneous breathing, nonlabored ventilation and respiratory function stable Cardiovascular status: blood pressure returned to baseline and stable Postop Assessment: no apparent nausea or vomiting Anesthetic complications: no    Last Vitals:  Vitals:   08/14/18 0145 08/14/18 0208  BP: 118/62 (!) 144/65  Pulse: 92 89  Resp: 20 20  Temp: 36.9 C 37 C  SpO2: 90% 92%    Last Pain:  Vitals:   08/14/18 0208  TempSrc: Oral  PainSc:                  Lynda Rainwater

## 2018-08-14 NOTE — Discharge Instructions (Signed)
[facility] ° °Please arrive at least 30 min before your appointment to complete your check in paperwork.  If you are unable to arrive 30 min prior to your appointment time we may have to cancel or reschedule you. °LAPAROSCOPIC SURGERY: POST OP INSTRUCTIONS °Always review your discharge instruction sheet given to you by the facility where your surgery was performed. °IF YOU HAVE DISABILITY OR FAMILY LEAVE FORMS, YOU MUST BRING THEM TO THE OFFICE FOR PROCESSING.   °DO NOT GIVE THEM TO YOUR DOCTOR. ° °PAIN CONTROL ° °1. First take acetaminophen (Tylenol) AND/or ibuprofen (Advil) to control your pain after surgery.  Follow directions on package.  Taking acetaminophen (Tylenol) and/or ibuprofen (Advil) regularly after surgery will help to control your pain and lower the amount of prescription pain medication you may need.  You should not take more than 4,000 mg (4 grams) of acetaminophen (Tylenol) in 24 hours.  You should not take ibuprofen (Advil), aleve, motrin, naprosyn or other NSAIDS if you have a history of stomach ulcers or chronic kidney disease.  °2. A prescription for pain medication may be given to you upon discharge.  Take your pain medication as prescribed, if you still have uncontrolled pain after taking acetaminophen (Tylenol) or ibuprofen (Advil). °3. Use ice packs to help control pain. °4. If you need a refill on your pain medication, please contact your pharmacy.  They will contact our office to request authorization. Prescriptions will not be filled after 5pm or on week-ends. ° °HOME MEDICATIONS °5. Take your usually prescribed medications unless otherwise directed. ° °DIET °6. You should follow a light diet the first few days after arrival home.  Be sure to include lots of fluids daily. Avoid fatty, fried foods.  ° °CONSTIPATION °7. It is common to experience some constipation after surgery and if you are taking pain medication.  Increasing fluid intake and taking a stool  softener (such as Colace) will usually help or prevent this problem from occurring.  A mild laxative (Milk of Magnesia or Miralax) should be taken according to package instructions if there are no bowel movements after 48 hours. ° °WOUND/INCISION CARE °8. Most patients will experience some swelling and bruising in the area of the incisions.  Ice packs will help.  Swelling and bruising can take several days to resolve.  °9. Unless discharge instructions indicate otherwise, follow guidelines below  °a. STERI-STRIPS - you may remove your outer bandages 48 hours after surgery, and you may shower at that time.  You have steri-strips (small skin tapes) in place directly over the incision.  These strips should be left on the skin for 7-10 days.   °b. DERMABOND/SKIN GLUE - you may shower in 24 hours.  The glue will flake off over the next 2-3 weeks. °10. Any sutures or staples will be removed at the office during your follow-up visit. ° °ACTIVITIES °11. You may resume regular (light) daily activities beginning the next day--such as daily self-care, walking, climbing stairs--gradually increasing activities as tolerated.  You may have sexual intercourse when it is comfortable.  Refrain from any heavy lifting or straining until approved by your doctor. °a. You may drive when you are no longer taking prescription pain medication, you can comfortably wear a seatbelt, and you can safely maneuver your car and apply brakes. ° °FOLLOW-UP °12. You should see your doctor in the office for a follow-up appointment approximately 2-3 weeks after your surgery.  You should have been given your post-op/follow-up appointment when   your surgery was scheduled.  If you did not receive a post-op/follow-up appointment, make sure that you call for this appointment within a day or two after you arrive home to insure a convenient appointment time. ° °OTHER INSTRUCTIONS ° °WHEN TO CALL YOUR DOCTOR: °1. Fever over 101.0 °2. Inability to  urinate °3. Continued bleeding from incision. °4. Increased pain, redness, or drainage from the incision. °5. Increasing abdominal pain ° °The clinic staff is available to answer your questions during regular business hours.  Please don’t hesitate to call and ask to speak to one of the nurses for clinical concerns.  If you have a medical emergency, go to the nearest emergency room or call 911.  A surgeon from [facility] is always on call at the hospital. °[address], [address], [place], [place]  [address] ? [address] [address], [place], [place]   [address] °[phone] ? [phone] ? FAX [phone] ° ° ° °

## 2018-08-14 NOTE — Progress Notes (Signed)
Tyrone [name]  1 Day Post-Op  Subjective: CC-  Patient states that he is pretty sore this morning. Denies n/v or abdominal bloating. Tolerating clear liquids. Passing some flatus. Urinating without any issues. Only about 8 hours out from surgery.  Objective: Vital signs in last 24 hours: Temp:  [97.4 F (36.3 C)-98.7 F (37.1 C)] 98.2 F (36.8 C) (11/01 5188) Pulse Rate:  [80-106] 81 (11/01 0638) Resp:  [11-26] 19 (11/01 0638) BP: (118-180)/(51-94) 140/74 (11/01 4166) SpO2:  [90 %-100 %] 95 % (11/01 0630) Last BM Date: 08/13/18  Intake/Output from previous day: 10/31 0701 - 11/01 0700 In: 1907.4 [P.O.:80; I.V.:1777.4; IV Piggyback:50] Out: 25 [Blood:25] Intake/Output this shift: No intake/output data recorded.  PE: Gen:  Alert, NAD, pleasant HEENT: EOM's intact, pupils equal and round Pulm:  effort normal Abd: Soft, mild distension, appropriately tender, +BS, lap incisions cdi Psych: A&Ox3  Skin: no rashes noted, warm and dry  Lab Results:  Recent Labs    08/13/18 1757 08/14/18 0534  WBC 13.6* 13.9*  HGB 14.0 13.0  HCT 42.3 39.3  PLT 316 289   BMET Recent Labs    08/13/18 1757  NA 138  K 3.5  CL 100  CO2 28  GLUCOSE 107*  BUN 15  CREATININE 1.04  CALCIUM 8.5*   PT/INR No results for input(s): LABPROT, INR in the last 72 hours. CMP     Component Value Date/Time   NA 138 08/13/2018 1757   K 3.5 08/13/2018 1757   CL 100 08/13/2018 1757   CO2 28 08/13/2018 1757   GLUCOSE 107 (H) 08/13/2018 1757   BUN 15 08/13/2018 1757   CREATININE 1.04 08/13/2018 1757   CALCIUM 8.5 (L) 08/13/2018 1757   PROT 7.6 08/13/2018 1757   ALBUMIN 4.0 08/13/2018 1757   AST 21 08/13/2018 1757   ALT 23 08/13/2018 1757   ALKPHOS 72 08/13/2018 1757   BILITOT 1.1 08/13/2018 1757   GFRNONAA >60 08/13/2018 1757   GFRAA >60 08/13/2018 1757   Lipase     Component Value Date/Time   LIPASE 53 (H) 08/13/2018 1757       Studies/Results: Ct  Abdomen Pelvis W Contrast  Result Date: 08/13/2018 CLINICAL DATA:  Right lower quadrant pain since Monday with nausea. EXAM: CT ABDOMEN AND PELVIS WITH CONTRAST TECHNIQUE: Multidetector CT imaging of the abdomen and pelvis was performed using the standard protocol following bolus administration of intravenous contrast. CONTRAST:  <See Chart> ISOVUE-300 IOPAMIDOL (ISOVUE-300) INJECTION 61% COMPARISON:  None. FINDINGS: Lower chest: Normal heart size without pericardial effusion. Subpleural atelectasis and/or scarring at each lung base. Minimal fibrosis is also suggested bilaterally at each lung base. Hepatobiliary: Nondistended gallbladder without stones or secondary signs of acute cholecystitis. Small fold noted near the gallbladder neck simulating a stone. Homogeneous appearance of the liver without space-occupying mass. Pancreas: Normal Spleen: Normal Adrenals/Urinary Tract: Normal bilateral adrenal glands. Simple cyst in the upper pole the right kidney measuring 1.5 cm. No nephrolithiasis nor hydroureteronephrosis. Normal bladder. Stomach/Bowel: Appendix: Location: McBurney's point Diameter: 15 mm Appendicolith: 8 mm appendicolith at its base Mucosal hyper-enhancement: Present Extraluminal gas: None Periappendiceal collection: Extensive periappendiceal inflammation is identified in the right lower quadrant without organized abscess. Mild fluid-filled distended jejunal loops suspicious for small bowel ileus. Sympathetic thickening of the cecum distal ileum. No mechanical bowel obstruction. Vascular/Lymphatic: Moderate aortoiliac atherosclerosis. No pathologically enlarged lymph nodes. Reproductive: Normal size prostate Other: No free air or free fluid. Musculoskeletal: Degenerative disc disease L4-5 and L5-S1 without acute  nor suspicious osseous lesions. IMPRESSION: 1. Acute appendicitis with the appendix measuring up to 15 mm in diameter at McBurney's point with extensive periappendiceal fatty inflammatory  change but without organized abscess or free air. An 8 mm appendicolith is noted at the base of the appendix. 2. Sympathetic thickening of the distal and terminal ileum and base of cecum with a component of small bowel ileus. 3. Simple right upper pole renal cyst. 4. Degenerative disc disease of the lower lumbar spine. Electronically Signed   By: Tyrone [name] M.D.   On: 08/13/2018 21:00    Anti-infectives: Anti-infectives (From admission, onward)   Start     Dose/Rate Route Frequency Ordered Stop   08/14/18 0600  piperacillin-tazobactam (ZOSYN) IVPB 3.375 g     3.375 g 12.5 mL/hr over 240 Minutes Intravenous Every 8 hours 08/14/18 0206     08/14/18 0000  piperacillin-tazobactam (ZOSYN) IVPB 3.375 g     3.375 g 100 mL/hr over 30 Minutes Intravenous  Once 08/13/18 2355 08/14/18 0003   08/13/18 2316  piperacillin-tazobactam (ZOSYN) 3.375 GM/50ML IVPB    [name] to Pharmacy:  Tyrone [name]   : cabinet override      08/13/18 2316 08/13/18 2333       Assessment/Plan Tobacco abuse - pulm toilet  Acute appendicitis with perforation and abscess S/p laparoscopic appendectomy 03/31 Dr. Lucia [name] - POD 1 - tolerating clears, passing flatus - patient will need to continue PO abx when discharged  ID - zosyn 10/31>> FEN - decrease IVF, reg diet, colace VTE - SCDs, lovenox Foley - none  Plan - Advance to regular diet. Ambulate later this morning. Continue IS. Will plan to recheck later today. Depending on pain control and diet tolerance, patient may be ready for discharge later today but it may not be until over the weekend.   LOS: 0 days    Tyrone [name] , [facility] 08/14/2018, 8:02 AM Pager: [phone] Mon 7:00 am -11:30 AM Tues-Fri 7:00 am-4:30 pm Sat-Sun 7:00 am-11:30 am

## 2018-08-14 NOTE — Op Note (Signed)
Re:   Tyrone [name] DOB:   10/10/1956 MRN:   [id number]                   FACILITY:  [facility]  DATE OF PROCEDURE: 08/14/2018                              OPERATIVE REPORT  PREOPERATIVE DIAGNOSIS:  Appendicitis  POSTOPERATIVE DIAGNOSIS:  Acute appendicitis with perforation and abscess  PROCEDURE:  Laparoscopic appendectomy.  SURGEON:  Fenton Malling. Lucia Gaskins, MD  ASSISTANT:  No first assistant.  ANESTHESIA:  General endotracheal.  Anesthesiologist: Lynda Rainwater, MD CRNA: Gean Maidens, CRNA  ASA:  2E  ESTIMATED BLOOD LOSS:  Minimal.  DRAINS: none   SPECIMEN:   Appendix  COUNTS CORRECT:  YES  INDICATIONS FOR PROCEDURE: Tyrone [name] is a 62 y.o. (DOB: Feb 12, 1956) white male whose primary care doctor is Wenda Low, MD and comes to the OR for an appendectomy.   I discussed with the patient, the indications and potential complications of appendiceal surgery.  The potential complications include, but are not limited to, bleeding, open surgery, bowel resection, and the possibility of another diagnosis.  OPERATIVE NOTE:  The patient underwent a general endotracheal anesthetic as supervised by Anesthesiologist: Lynda Rainwater, MD CRNA: Gean Maidens, CRNA, General, in [place] room #1 at [facility].  The patient was given Zosyn prior to the beginning of the procedure and the abdomen was prepped with ChloraPrep.   A time-out was held and surgical checklist run.  An infraumbilical incision was made with sharp dissection carried down to the abdominal cavity.  An 12 mm Hasson trocar was inserted through the infraumbilical incision and into the peritoneal cavity.  A 30 degree 5 mm laparoscope was inserted through a 12 mm Hasson trocar and the Hasson trocar secured with a 0 Vicryl suture.  I placed a 12 mm trocar in the right upper quadrant and a 5 mm torcar in left lower quadrant and did abdominal exploration.    The right and left lobes of liver unremarkable.  Stomach was  unremarkable.  The pelvic organs were unremarkable.  I saw no other intra-abdominal abnormality.  The patient had appendicitis with the appendix located at the right pelvic brim and along side the cecum.  The appendix and cecum were stuck to the lateral peritoneal wall.  When I took this down, I found an abscess of about 10 cc.  The appendix was perforated about mid body.  The abscess and appendix had been wall off by the cecum and there was not more general peritonitis.   I bluntly and sharply dissected the appendix off the right lateral peritoneum.   The mesentery of the appendix was divided with a Harmonic scalpel.  I got to the base of the appendix.  I then used a green load 45 mm Ethicon Endo-GIA stapler and fired this across the base of the appendix.  I placed the appendix in EndoCatch bag and delivered the bag through the umbilical incision.  I irrigated the abdomen with 2,000 cc of saline.  After irrigating the abdomen, I then removed the trocars, in turn.  The umbilical port fascia was closed with 0 Vicryl suture.   I closed the skin each site with a 4-0 Monocryl suture and painted the wounds with DermaBond.  I then injected a total of 30 mL of 0.25% Marcaine at the incisions.  Sponge and needle  count were correct at the end of the case.  The patient was transferred to the recovery room in good condition.  The patient tolerated the procedure well and it depends on the patient's post op clinical course as to when the patient could be discharged.   Alphonsa Overall, MD, [facility] Pager: [phone] Office phone:  [phone]

## 2018-08-14 NOTE — Plan of Care (Signed)

## 2018-08-17 ENCOUNTER — Other Ambulatory Visit: Payer: Self-pay | Admitting: Urology

## 2018-08-17 DIAGNOSIS — C61 Malignant neoplasm of prostate: Secondary | ICD-10-CM

## 2018-08-27 ENCOUNTER — Other Ambulatory Visit: Payer: Self-pay | Admitting: Student

## 2018-08-27 ENCOUNTER — Ambulatory Visit
Admission: RE | Admit: 2018-08-27 | Discharge: 2018-08-27 | Disposition: A | Discharge status: Home / Self Care | Payer: Managed Care, Other (non HMO) | Source: Ambulatory Visit | Attending: Student | Admitting: Student

## 2018-08-27 DIAGNOSIS — R109 Unspecified abdominal pain: Secondary | ICD-10-CM

## 2018-08-27 MED ORDER — IOPAMIDOL (ISOVUE-300) INJECTION 61%
125.0000 mL | Freq: Once | INTRAVENOUS | Status: AC | PRN
  Administered 2018-08-27: 125 mL via INTRAVENOUS

## 2018-09-08 DIAGNOSIS — R109 Unspecified abdominal pain: Secondary | ICD-10-CM | POA: Diagnosis not present

## 2018-09-09 DIAGNOSIS — R109 Unspecified abdominal pain: Secondary | ICD-10-CM | POA: Diagnosis not present

## 2018-10-19 ENCOUNTER — Other Ambulatory Visit: Payer: Self-pay | Admitting: Urology

## 2018-10-22 ENCOUNTER — Ambulatory Visit
Admission: RE | Admit: 2018-10-22 | Discharge: 2018-10-22 | Disposition: A | Discharge status: Home / Self Care | Payer: 59 | Source: Ambulatory Visit | Attending: Urology | Admitting: Urology

## 2018-10-22 DIAGNOSIS — C61 Malignant neoplasm of prostate: Secondary | ICD-10-CM

## 2018-10-22 MED ORDER — GADOBENATE DIMEGLUMINE 529 MG/ML IV SOLN
20.0000 mL | Freq: Once | INTRAVENOUS | Status: AC | PRN
  Administered 2018-10-22: 20 mL via INTRAVENOUS

## 2018-11-11 DIAGNOSIS — C61 Malignant neoplasm of prostate: Secondary | ICD-10-CM | POA: Diagnosis not present

## 2018-11-19 DIAGNOSIS — C61 Malignant neoplasm of prostate: Secondary | ICD-10-CM | POA: Diagnosis not present

## 2018-11-25 ENCOUNTER — Encounter: Payer: Self-pay | Admitting: *Deleted

## 2018-11-30 ENCOUNTER — Encounter: Payer: Self-pay | Admitting: Radiation Oncology

## 2018-11-30 NOTE — Progress Notes (Signed)
GU Location of Tumor / Histology: prostatic adenocarcinoma  If Prostate Cancer, Gleason Score is (3 + 4) and PSA is (5.67). Prostate volume: 26.21  Talmage Coin Butler was referred by Dr. Wenda Low, MD to Dr. Gloriann Loan in June 2019 for further evaluation of an elevated PSA. Reports he was first told his PSA was elevated in February 2019 by his PCP. Patient explains he was given antibiotics and PSA was repeated in the end of May or June.   Biopsies of prostate (if applicable) revealed:    Past/Anticipated interventions by urology, if any: prostate biopsy, active surveillance, MRI/US fusion guided biopsy, referral for consideration of brachytherapy  Past/Anticipated interventions by medical oncology, if any: no  Weight changes, if any: no  Bowel/Bladder complaints, if any: Reports nocturia x 0-1. Denies urinary incontinence. Reports he is not currently sexually active. IPSS 0. SHIM 14.   Nausea/Vomiting, if any: no  Pain issues, if any:  Chronic low back pain. Hx of surgery.  SAFETY ISSUES:  Prior radiation? no  Pacemaker/ICD? no  Possible current pregnancy? no, male patient  Is the patient on methotrexate? no  Current Complaints / other details:  63 year old male. Married with 3 sons and 3 daughters. AX: fentanyl and neurontin. Smokes a ppd. Married for 46 years.

## 2018-12-01 ENCOUNTER — Encounter: Payer: Self-pay | Admitting: Radiation Oncology

## 2018-12-01 ENCOUNTER — Ambulatory Visit
Admission: RE | Admit: 2018-12-01 | Discharge: 2018-12-01 | Disposition: A | Discharge status: Home / Self Care | Payer: 59 | Source: Ambulatory Visit | Attending: Radiation Oncology | Admitting: Radiation Oncology

## 2018-12-01 ENCOUNTER — Encounter: Payer: Self-pay | Admitting: Medical Oncology

## 2018-12-01 ENCOUNTER — Other Ambulatory Visit: Payer: Self-pay

## 2018-12-01 VITALS — BP 152/67 | HR 73 | Temp 98.2°F | Resp 18 | Ht 70.0 in | Wt 213.5 lb

## 2018-12-01 DIAGNOSIS — F17210 Nicotine dependence, cigarettes, uncomplicated: Secondary | ICD-10-CM | POA: Insufficient documentation

## 2018-12-01 DIAGNOSIS — I10 Essential (primary) hypertension: Secondary | ICD-10-CM | POA: Insufficient documentation

## 2018-12-01 DIAGNOSIS — C61 Malignant neoplasm of prostate: Secondary | ICD-10-CM | POA: Insufficient documentation

## 2018-12-01 DIAGNOSIS — F329 Major depressive disorder, single episode, unspecified: Secondary | ICD-10-CM | POA: Insufficient documentation

## 2018-12-01 DIAGNOSIS — Z800 Family history of malignant neoplasm of digestive organs: Secondary | ICD-10-CM | POA: Insufficient documentation

## 2018-12-01 DIAGNOSIS — R972 Elevated prostate specific antigen [PSA]: Secondary | ICD-10-CM | POA: Diagnosis not present

## 2018-12-01 HISTORY — DX: Malignant neoplasm of prostate: C61

## 2018-12-01 NOTE — Progress Notes (Signed)
See progress note under physician encounter. 

## 2018-12-01 NOTE — Progress Notes (Signed)
Introduced myself to Tyrone [name] and his wife as the prostate nurse navigator and my role. After discussing his treatment options he is undecided. He is leaning toward prostatectomy but needs more time to think about his options before making his final decision. I discussed the prostate support group and encouraged him to attend where he can meet men that have had the various treatments.  I gave them my business card and asked him to call with questions or concerns. He voiced understanding.

## 2018-12-01 NOTE — Progress Notes (Signed)
Radiation Oncology         [phone] ________________________________  Initial outpatient Consultation  Name: Tyrone [name] MRN: [id number]  Date: 12/01/2018  DOB: 1956-02-10  QQ:[id number], [place] Ar, MD  Lucas Mallow, MD   REFERRING PHYSICIAN: Lucas Mallow, MD  DIAGNOSIS: 63 y.o. gentleman with Stage T1c adenocarcinoma of the prostate with Gleason score of 3+4, and PSA of 5.12.    ICD-10-CM   1. Malignant neoplasm of prostate [facility]) C61 Ambulatory referral to Social Work    HISTORY OF PRESENT ILLNESS: Tyrone [name] is a 63 y.o. male with a diagnosis of prostate cancer. He was noted to have an elevated PSA of 5.67 by his primary care physician, Dr. Lysle Rubens.  Accordingly, he was referred for evaluation in urology by Dr. Gloriann Loan on 03/19/2018,  digital rectal examination was performed at that time revealing no nodules.  The patient underwent prostate biopsy on 04/14/2018, which showed 2 cores of Gleason 3+3. The patient opted to pursue active surveillance at the time.   He underwent prostate MRI on 10/22/2018, which revealed an 11 mm PI-RADS 3 lesion in the left mid gland peripheral zone without evidence of LAN, ECE or metastatic disease. He proceeded to an MRI fusion prostate biopsy on 11/11/18.  Prostate volume measured 26.2 cc.  PSA was repeated at that time and remained elevated at 5.12. Out of 12 core biopsies, 3 were positive.  All region of interest (ROI) cores were negative but there was Gleason 3+4 disease in the right mid lateral core and Gleason 3+3 in the right mid and right apex.  The patient reviewed the biopsy results with his urologist and he has kindly been referred today for discussion of potential radiation treatment options.   PREVIOUS RADIATION THERAPY: No  PAST MEDICAL HISTORY:  Past Medical History:  Diagnosis Date  . Depression   . Headache    migraines  . Hypertension   . Prostate cancer ([place])       PAST SURGICAL HISTORY: Past Surgical History:    Procedure Laterality Date  . BACK SURGERY  1980"s   L4 to L 5 laminectomy  . COLONOSCOPY WITH PROPOFOL N/A 02/06/2016   Procedure: COLONOSCOPY WITH PROPOFOL;  Surgeon: Garlan Fair, MD;  Location: [facility];  Service: Endoscopy;  Laterality: N/A;  . LAPAROSCOPIC APPENDECTOMY N/A 08/13/2018   Procedure: APPENDECTOMY LAPAROSCOPIC;  Surgeon: Alphonsa Overall, MD;  Location: [facility];  Service: General;  Laterality: N/A;  . WRIST SURGERY Right    x 2     FAMILY HISTORY:  Family History  Problem Relation Age of Onset  . Colon cancer Maternal Grandmother   . Colon cancer Maternal Great-grandmother   . Prostate cancer Neg Hx     SOCIAL HISTORY:  Social History   Socioeconomic History  . Marital status: Married    Spouse name: Not on file  . Number of children: 6  . Years of education: Not on file  . Highest education level: Not on file  Occupational History  . Occupation: Courtyard  Social Needs  . Financial resource strain: Not on file  . Food insecurity:    Worry: Not on file    Inability: Not on file  . Transportation needs:    Medical: Not on file    Non-medical: Not on file  Tobacco Use  . Smoking status: Current Every Day Smoker    Packs/day: 1.00    Years: 45.00    Pack years: 45.00    Types:  Cigarettes  . Smokeless tobacco: Never Used  Substance and Sexual Activity  . Alcohol use: Yes    Comment: yearly beer  . Drug use: No  . Sexual activity: Not Currently  Lifestyle  . Physical activity:    Days per week: Not on file    Minutes per session: Not on file  . Stress: Not on file  Relationships  . Social connections:    Talks on phone: Not on file    Gets together: Not on file    Attends religious service: Not on file    Active member of club or organization: Not on file    Attends meetings of clubs or organizations: Not on file    Relationship status: Not on file  . Intimate partner violence:    Fear of current or ex partner: Not on file     Emotionally abused: Not on file    Physically abused: Not on file    Forced sexual activity: Not on file  Other Topics Concern  . Not on file  Social History Narrative   Married for 36 years with 3 sons and 3 daughters. Smokes a ppd.     ALLERGIES: Neurontin [gabapentin]  MEDICATIONS:  Current Outpatient Medications  Medication Sig Dispense Refill  . Multiple Vitamins-Minerals (MULTIVITAMIN ADULT) TABS Take 1 tablet by mouth daily.     No current facility-administered medications for this encounter.     REVIEW OF SYSTEMS:  On review of systems, the patient reports that he is doing well overall. He denies any chest pain, shortness of breath, cough, fevers, chills, night sweats, unintended weight changes. He denies any bowel disturbances, and denies abdominal pain, nausea or vomiting. He denies any new musculoskeletal or joint aches or pains. His IPSS was 0, indicating no urinary symptoms. His SHIM was 14, indicating he does have erectile dysfunction, but he states he is not currently sexually active. A complete review of systems is obtained and is otherwise negative.  PHYSICAL EXAM:  Wt Readings from Last 3 Encounters:  12/01/18 213 lb 8 oz (96.8 kg)  02/06/16 205 lb (93 kg)  06/24/13 210 lb (95.3 kg)   Temp Readings from Last 3 Encounters:  12/01/18 98.2 F (36.8 C) (Oral)  08/14/18 98.1 F (36.7 C) (Oral)  02/06/16 97.5 F (36.4 C) (Oral)   BP Readings from Last 3 Encounters:  12/01/18 (!) 152/67  08/14/18 (!) 144/61  02/06/16 133/61   Pulse Readings from Last 3 Encounters:  12/01/18 73  08/14/18 76  02/06/16 73   Pain Assessment Pain Score: 0-No pain/10  In general this is a well appearing Caucasian gentleman in no acute distress. He is alert and oriented x4 and appropriate throughout the examination. HEENT reveals that the patient is normocephalic, atraumatic. EOMs are intact. PERRLA. Skin is intact without any evidence of gross lesions. Cardiovascular exam reveals  a regular rate and rhythm, no clicks rubs or murmurs are auscultated. Chest is clear to auscultation bilaterally. Lymphatic assessment is performed and does not reveal any adenopathy in the cervical, supraclavicular, axillary, or inguinal chains. Abdomen has active bowel sounds in all quadrants and is intact. The abdomen is soft, non tender, non distended. Lower extremities are negative for pretibial pitting edema, deep calf tenderness, cyanosis or clubbing.   KPS = 100  100 - Normal; no complaints; no evidence of disease. 90   - Able to carry on normal activity; minor signs or symptoms of disease. 80   - Normal activity with effort; some  signs or symptoms of disease. 60   - Cares for self; unable to carry on normal activity or to do active work. 60   - Requires occasional assistance, but is able to care for most of his personal needs. 50   - Requires considerable assistance and frequent medical care. 37   - Disabled; requires special care and assistance. 28   - Severely disabled; hospital admission is indicated although death not imminent. 56   - Very sick; hospital admission necessary; active supportive treatment necessary. 10   - Moribund; fatal processes progressing rapidly. 0     - Dead  Karnofsky DA, Abelmann [place], Craver LS and Burchenal [facility] [phone]) The use of the nitrogen mustards in the palliative treatment of carcinoma: with particular reference to bronchogenic carcinoma Cancer 1 [phone]  LABORATORY DATA:  Lab Results  Component Value Date   WBC 13.9 (H) 08/14/2018   HGB 13.0 08/14/2018   HCT 39.3 08/14/2018   MCV 91.0 08/14/2018   PLT 289 08/14/2018   Lab Results  Component Value Date   NA 138 08/13/2018   K 3.5 08/13/2018   CL 100 08/13/2018   CO2 28 08/13/2018   Lab Results  Component Value Date   ALT 23 08/13/2018   AST 21 08/13/2018   ALKPHOS 72 08/13/2018   BILITOT 1.1 08/13/2018     RADIOGRAPHY: No results found.    IMPRESSION/PLAN: 1. 63 y.o. gentleman with  Stage T1c adenocarcinoma of the prostate with Gleason Score of 3+4, and PSA of 5.12. We discussed the patient's workup and outlined the nature of prostate cancer in this setting. The patient's T stage, Gleason's score, and PSA put him into the favorable intermediate risk group. Accordingly, he is eligible for a variety of potential treatment options including brachytherapy, 5.5 weeks of external radiation or prostatectomy. We discussed the available radiation techniques, and focused on the details and logistics and delivery. We discussed and outlined the risks, benefits, short and long-term effects associated with radiotherapy and compared and contrasted these with prostatectomy. We discussed the role of SpaceOAR in reducing the rectal toxicity associated with radiotherapy.   At the end of the conversation the patient remains undecided regarding his preferred treatment and would like to take more time to consider his options. He is currently considering prostatectomy and external beam radiation.  He plans to contact us once he has reached his decision and we will move forward with treatment planning accordingly at that time.  He knows to call with any questions or concerns in the interim.  We spent 60 minutes face to face with the patient and more than 50% of that time was spent in counseling and/or coordination of care.    Nicholos Johns, PA-C    Tyler Pita, MD  [facility] Direct Dial: [phone]  Fax: [phone] [email or web address]  Skype  [email or web address]   This document serves as a record of services personally performed by Tyler Pita, MD and Freeman Caldron, PA-C. It was created on their behalf by Wilburn Mylar, a trained medical scribe. The creation of this record is based on the scribe's personal observations and the provider's statements to them. This document has been checked and approved by the attending provider.

## 2018-12-02 ENCOUNTER — Encounter: Payer: Self-pay | Admitting: General Practice

## 2018-12-02 DIAGNOSIS — C61 Malignant neoplasm of prostate: Secondary | ICD-10-CM | POA: Insufficient documentation

## 2018-12-02 NOTE — Progress Notes (Signed)
Tyrone [name] Psychosocial Distress Screening Clinical Social Work  Clinical Social Work was referred by distress screening protocol.  The patient scored a 8 on the Psychosocial Distress Thermometer which indicates moderate distress. Clinical Social Worker contacted patient by phone to assess for distress and other psychosocial needs. Was overwhelmed by information given yesterday - having difficulty coping w anxiety re cancer diagnosis and upcoming treatment.  Feels he does not want to share his distress w family members for fear of upsetting them, thus becoming isolated in his anxiety.  CSW and patient discussed common feeling and emotions when being diagnosed with cancer, and the importance of support during treatment. CSW informed patient of the support team and support services at [facility]. CSW provided contact information and encouraged patient to call with any questions or concerns.  Provided contact information for prostate cancer support group and will mail information packet for [place], encouraged to call back if these resources do not provide needed help so further referrals can be made.    ONCBCN DISTRESS SCREENING 12/01/2018  Screening Type Initial Screening  Distress experienced in past week (1-10) 8  Practical problem type Work/school  Emotional problem type Depression;Nervousness/Anxiety;Adjusting to illness;Isolation/feeling alone  Information Concerns Type Lack of info about diagnosis;Lack of info about treatment;Lack of info about complementary therapy choices  Physical Problem type Sleep/insomnia;Sexual problems  Physician notified of physical symptoms Yes  Referral to clinical psychology No  Referral to clinical social work No  Referral to dietition No  Referral to financial advocate No  Referral to support programs No  Referral to palliative care No    Clinical Social Worker follow up needed: No.  If yes, follow up plan:  Beverely Pace, [place],  LCSW Clinical Social Worker Phone:  [phone]

## 2018-12-09 DIAGNOSIS — I10 Essential (primary) hypertension: Secondary | ICD-10-CM | POA: Diagnosis not present

## 2018-12-09 DIAGNOSIS — R7303 Prediabetes: Secondary | ICD-10-CM | POA: Diagnosis not present

## 2018-12-09 DIAGNOSIS — E782 Mixed hyperlipidemia: Secondary | ICD-10-CM | POA: Diagnosis not present

## 2018-12-09 DIAGNOSIS — Z0000 Encounter for general adult medical examination without abnormal findings: Secondary | ICD-10-CM | POA: Diagnosis not present

## 2018-12-23 DIAGNOSIS — C61 Malignant neoplasm of prostate: Secondary | ICD-10-CM | POA: Diagnosis not present

## 2018-12-28 ENCOUNTER — Other Ambulatory Visit: Payer: Self-pay | Admitting: Urology

## 2018-12-31 ENCOUNTER — Encounter: Payer: Self-pay | Admitting: Urology

## 2018-12-31 NOTE — Progress Notes (Signed)
Scheduled for prostatectomy with Dr. Gloriann Loan 02/08/19. Tyrone [name]

## 2019-01-04 DIAGNOSIS — M6281 Muscle weakness (generalized): Secondary | ICD-10-CM | POA: Diagnosis not present

## 2019-01-04 DIAGNOSIS — C61 Malignant neoplasm of prostate: Secondary | ICD-10-CM | POA: Diagnosis not present

## 2019-01-13 DIAGNOSIS — M6281 Muscle weakness (generalized): Secondary | ICD-10-CM | POA: Diagnosis not present

## 2019-01-13 DIAGNOSIS — C61 Malignant neoplasm of prostate: Secondary | ICD-10-CM | POA: Diagnosis not present

## 2019-01-26 DIAGNOSIS — C61 Malignant neoplasm of prostate: Secondary | ICD-10-CM | POA: Diagnosis not present

## 2019-01-27 DIAGNOSIS — R0989 Other specified symptoms and signs involving the circulatory and respiratory systems: Secondary | ICD-10-CM | POA: Diagnosis not present

## 2019-01-28 ENCOUNTER — Telehealth (HOSPITAL_COMMUNITY): Payer: Self-pay | Admitting: Rehabilitation

## 2019-01-28 NOTE — Telephone Encounter (Signed)
The above patient or their representative was contacted and gave the following answers to these questions:         Do you have any of the following symptoms? No  Fever                    Cough                   Shortness of breath  Do  you have any of the following other symptoms? No   muscle pain         vomiting,        diarrhea        rash         weakness        red eye        abdominal pain         bruising          bruising or bleeding              joint pain           severe headache    Have you been in contact with someone who was or has been sick in the past 2 weeks? No  Yes                 Unsure                         Unable to assess   Does the person that you were in contact with have any of the following symptoms?   Cough         shortness of breath           muscle pain         vomiting,            diarrhea            rash            weakness           fever            red eye           abdominal pain           bruising  or  bleeding                joint pain                severe headache               Have you  or someone you have been in contact with traveled internationally in th last month? No        If yes, which countries?   Have you  or someone you have been in contact with traveled outside [place] in th last month? No         If yes, which state and city?   COMMENTS OR ACTION PLAN FOR THIS PATIENT:          

## 2019-01-29 ENCOUNTER — Other Ambulatory Visit (HOSPITAL_COMMUNITY): Payer: Self-pay | Admitting: Internal Medicine

## 2019-01-29 ENCOUNTER — Other Ambulatory Visit: Payer: Self-pay

## 2019-01-29 ENCOUNTER — Ambulatory Visit (HOSPITAL_COMMUNITY)
Admission: RE | Admit: 2019-01-29 | Discharge: 2019-01-29 | Disposition: A | Discharge status: Home / Self Care | Payer: 59 | Source: Ambulatory Visit | Attending: Family | Admitting: Family

## 2019-01-29 DIAGNOSIS — R0989 Other specified symptoms and signs involving the circulatory and respiratory systems: Secondary | ICD-10-CM

## 2019-01-29 NOTE — Progress Notes (Signed)
Bilateral carotid duplex performed. Preliminary report in Epic.

## 2019-01-29 NOTE — Patient Instructions (Addendum)
Tyrone [name]  01/29/2019   Your procedure is scheduled on: 02-08-2019   Report to [facility] Main  Entrance     Report to [place] at 5:30AM    Call this number if you have problems the morning of surgery [phone]      Remember: Do not eat food or drink liquids :After Midnight.    BRUSH YOUR TEETH MORNING OF SURGERY AND RINSE YOUR MOUTH OUT, NO CHEWING GUM CANDY OR MINTS.     Take these medicines the morning of surgery with A SIP OF WATER: NONE                                You may not have any metal on your body including hair pins and              piercings  Do not wear jewelry, lotions, powders, cologne, deodorant                       Men may shave face and neck.   Do not bring valuables to the hospital. [place].  Contacts, dentures or bridgework may not be worn into surgery.  Leave suitcase in the car. After surgery it may be brought to your room.     Patients discharged the day of surgery will not be allowed to drive home.  IF YOU ARE HAVING SURGERY AND GOING HOME THE SAME DAY, YOU MUST HAVE AN ADULT TO DRIVE YOU HOME AND BE WITH YOU FOR 24 HOURS. YOU MAY GO HOME BY TAXI OR UBER OR ORTHERWISE, BUT AN ADULT MUST ACCOMPANY YOU HOME AND STAY WITH YOU FOR 24 HOURS.    Special Instructions: N/A              Please read over the following fact sheets you were given: _____________________________________________________________________             [facility] - Preparing for Surgery Before surgery, you can play an important role.  Because skin is not sterile, your skin needs to be as free of germs as possible.  You can reduce the number of germs on your skin by washing with CHG (chlorahexidine gluconate) soap before surgery.  CHG is an antiseptic cleaner which kills germs and bonds with the skin to continue killing germs even after washing. Please DO NOT use if you have an allergy to CHG  or antibacterial soaps.  If your skin becomes reddened/irritated stop using the CHG and inform your nurse when you arrive at Short Stay. Do not shave (including legs and underarms) for at least 48 hours prior to the first CHG shower.  You may shave your face/neck. Please follow these instructions carefully:  1.  Shower with CHG Soap the night before surgery and the  morning of Surgery.  2.  If you choose to wash your hair, wash your hair first as usual with your  normal  shampoo.  3.  After you shampoo, rinse your hair and body thoroughly to remove the  shampoo.                           4.  Use CHG as you  would any other liquid soap.  You can apply chg directly  to the skin and wash                       Gently with a scrungie or clean washcloth.  5.  Apply the CHG Soap to your body ONLY FROM THE NECK DOWN.   Do not use on face/ open                           Wound or open sores. Avoid contact with eyes, ears mouth and genitals (private parts).                       Wash face,  Genitals (private parts) with your normal soap.             6.  Wash thoroughly, paying special attention to the area where your surgery  will be performed.  7.  Thoroughly rinse your body with warm water from the neck down.  8.  DO NOT shower/wash with your normal soap after using and rinsing off  the CHG Soap.                9.  Pat yourself dry with a clean towel.            10.  Wear clean pajamas.            11.  Place clean sheets on your bed the night of your first shower and do not  sleep with pets. Day of Surgery : Do not apply any lotions/deodorants the morning of surgery.  Please wear clean clothes to the hospital/surgery center.  FAILURE TO FOLLOW THESE INSTRUCTIONS MAY RESULT IN THE CANCELLATION OF YOUR SURGERY PATIENT SIGNATURE_________________________________  NURSE SIGNATURE__________________________________  ________________________________________________________________________

## 2019-02-01 ENCOUNTER — Encounter (HOSPITAL_COMMUNITY): Payer: Self-pay

## 2019-02-01 ENCOUNTER — Other Ambulatory Visit: Payer: Self-pay

## 2019-02-01 ENCOUNTER — Encounter (HOSPITAL_COMMUNITY): Admission: RE | Admit: 2019-02-01 | Payer: 59 | Source: Ambulatory Visit

## 2019-02-01 ENCOUNTER — Inpatient Hospital Stay (HOSPITAL_COMMUNITY): Admission: RE | Admit: 2019-02-01 | Payer: 59 | Source: Ambulatory Visit

## 2019-02-01 ENCOUNTER — Encounter (HOSPITAL_COMMUNITY)
Admission: RE | Admit: 2019-02-01 | Discharge: 2019-02-01 | Disposition: A | Discharge status: Home / Self Care | Payer: 59 | Source: Ambulatory Visit | Attending: Urology | Admitting: Urology

## 2019-02-01 DIAGNOSIS — Z01818 Encounter for other preprocedural examination: Secondary | ICD-10-CM | POA: Diagnosis not present

## 2019-02-01 DIAGNOSIS — C61 Malignant neoplasm of prostate: Secondary | ICD-10-CM | POA: Diagnosis not present

## 2019-02-01 LAB — BASIC METABOLIC PANEL
Anion gap: 7 (ref 5–15)
BUN: 13 mg/dL (ref 8–23)
CO2: 27 mmol/L (ref 22–32)
Calcium: 9.3 mg/dL (ref 8.9–10.3)
Chloride: 105 mmol/L (ref 98–111)
Creatinine, Ser: 0.81 mg/dL (ref 0.61–1.24)
GFR calc Af Amer: >60 mL/min (ref >60)
GFR calc non Af Amer: >60 mL/min (ref >60)
Glucose, Bld: 109 mg/dL — ABNORMAL HIGH (ref 70–99)
Potassium: 4.7 mmol/L (ref 3.5–5.1)
Sodium: 139 mmol/L (ref 135–145)

## 2019-02-01 LAB — PROTIME-INR
INR: 0.9 (ref 0.8–1.2)
Prothrombin Time: 11.7 seconds (ref 11.4–15.2)

## 2019-02-01 LAB — CBC
HCT: 47.9 % (ref 39.0–52.0)
Hemoglobin: 16.1 g/dL (ref 13.0–17.0)
MCH: 30.8 pg (ref 26.0–34.0)
MCHC: 33.6 g/dL (ref 30.0–36.0)
MCV: 91.8 fL (ref 80.0–100.0)
Platelets: 301 10*3/uL (ref 150–400)
RBC: 5.22 MIL/uL (ref 4.22–5.81)
RDW: 12.3 % (ref 11.5–15.5)
WBC: 7 10*3/uL (ref 4.0–10.5)
nRBC: 0 % (ref 0.0–0.2)

## 2019-02-01 NOTE — Progress Notes (Signed)
Per lab, pt's type and screen was positive for antibodies", and needs to be recollected on DOS... Order placed.

## 2019-02-02 NOTE — Progress Notes (Signed)
Anesthesia Chart Review   Case:  [id number] Date/Time:  02/08/19 0700   Procedures:      XI ROBOTIC ASSISTED LAPAROSCOPIC RADICAL PROSTATECTOMY (N/A )     PELVIC LYMPH NODE DISSECTION (Bilateral )   Anesthesia type:  General   Pre-op diagnosis:  PROSTATE CANCER   Location:  [place] [facility] / [facility]   Surgeon:  Lucas Mallow, MD      DISCUSSION: 63 yo current every day smoker (90 pack years) with h/o HTN, depression, headaches, prostate cancer scheduled for above procedure 02/08/19 with Dr. Link Snuffer.   Pt can proceed with planned procedure barring acute status change.  VS: BP (!) 145/78   Pulse 80   Temp 36.6 C (Oral)   Resp 18   Ht 5\' 10"  (1.778 m)   Wt 96.7 kg   SpO2 99%   BMI 30.58 kg/m   PROVIDERS: Wenda Low, MD is PCP   Tyler Pita, MD is Oncologist  LABS: Labs reviewed: Acceptable for surgery. (all labs ordered are listed, but only abnormal results are displayed)  Labs Reviewed  BASIC METABOLIC PANEL - Abnormal; Notable for the following components:      Result Value   Glucose, Bld 109 (*)    All other components within normal limits  CBC  PROTIME-INR  TYPE AND SCREEN     IMAGES: VAS US CAROTID 01/29/19 Summary: Right Carotid: Velocities in the right ICA are consistent with a 1-39% stenosis.                Non-hemodynamically significant plaque <50% noted in the CCA. The                ECA appears >50% stenosed. Left Carotid: Velocities in the left ICA are consistent with a 1-39% stenosis.               Non-hemodynamically significant plaque noted in the CCA. The ECA               appears <50% stenosed. Vertebrals:  Bilateral vertebral arteries demonstrate antegrade flow. Subclavians: Normal flow hemodynamics were seen in bilateral subclavian              arteries.  MRI Prostate 10/22/18 IMPRESSION: 1. Lesion in the LEFT mid gland best depicted on T2 weighted imaging and with potential mild restricted diffusion is indeterminate for high-grade  carcinoma ( PI-RADS: 3). 3D post processing (Dynacad) performed. 2. Normal appearing transitional zone.  EKG: 02/01/2019 Rate 74 bpm Normal sinus rhythm  Normal ECG No significant change since last tracing   CV:  Past Medical History:  Diagnosis Date  . Depression   . Headache    migraines  . Hypertension   . Prostate cancer [facility])     Past Surgical History:  Procedure Laterality Date  . APPENDECTOMY    . BACK SURGERY  1980"s   L4 to L 5 laminectomy  . COLONOSCOPY WITH PROPOFOL N/A 02/06/2016   Procedure: COLONOSCOPY WITH PROPOFOL;  Surgeon: Garlan Fair, MD;  Location[facility];  Service: Endoscopy;  Laterality: N/A;  . LAPAROSCOPIC APPENDECTOMY N/A 08/13/2018   Procedure: APPENDECTOMY LAPAROSCOPIC;  Surgeon: Alphonsa Overall, MD;  Location: [facility];  Service: General;  Laterality: N/A;  . WRIST SURGERY Right    x 2     MEDICATIONS: . Multiple Vitamins-Minerals (MULTIVITAMIN ADULT) TABS   No current facility-administered medications for this encounter.      Maia Plan [facility] Pre-Surgical Testing [phone] 02/02/19 12:31  PM

## 2019-02-02 NOTE — Anesthesia Preprocedure Evaluation (Addendum)
Anesthesia Evaluation  Patient identified by MRN, date of birth, ID band Patient awake    Reviewed: Allergy & Precautions, NPO status , Patient's Chart, lab work & pertinent test results  History of Anesthesia Complications Negative for: history of anesthetic complications  Airway Mallampati: II  TM Distance: >3 FB Neck ROM: Full    Dental  (+) Edentulous Upper, Edentulous Lower   Pulmonary Current Smoker,    breath sounds clear to auscultation       Cardiovascular hypertension,  Rhythm:Regular     Neuro/Psych  Headaches, PSYCHIATRIC DISORDERS Depression    GI/Hepatic negative GI ROS,   Endo/Other    Renal/GU      Musculoskeletal   Abdominal   Peds  Hematology   Anesthesia Other Findings   Reproductive/Obstetrics                                                           Anesthesia Evaluation  Patient identified by MRN, date of birth, ID band Patient awake    Reviewed: Allergy & Precautions, NPO status , Patient's Chart, lab work & pertinent test results  Airway Mallampati: II  TM Distance: >3 FB Neck ROM: Full    Dental no notable dental hx.    Pulmonary Current Smoker,    Pulmonary exam normal breath sounds clear to auscultation       Cardiovascular hypertension, Pt. on medications Normal cardiovascular exam Rhythm:Regular Rate:Normal     Neuro/Psych  Headaches, PSYCHIATRIC DISORDERS Depression    GI/Hepatic negative GI ROS, Neg liver ROS,   Endo/Other  negative endocrine ROS  Renal/GU negative Renal ROS  negative genitourinary   Musculoskeletal negative musculoskeletal ROS (+)   Abdominal   Peds negative pediatric ROS (+)  Hematology negative hematology ROS (+)   Anesthesia Other Findings   Reproductive/Obstetrics negative OB ROS                             Anesthesia Physical  Anesthesia Plan  ASA: II and  emergent  Anesthesia Plan: General   Post-op Pain Management:    Induction: Intravenous, Rapid sequence and Cricoid pressure planned  PONV Risk Score and Plan: 1 and Ondansetron  Airway Management Planned: Oral ETT  Additional Equipment:   Intra-op Plan:   Post-operative Plan: Extubation in OR  Informed Consent: I have reviewed the patients History and Physical, chart, labs and discussed the procedure including the risks, benefits and alternatives for the proposed anesthesia with the patient or authorized representative who has indicated his/her understanding and acceptance.   Dental advisory given  Plan Discussed with: CRNA  Anesthesia Plan Comments:        Anesthesia Quick Evaluation  Anesthesia Physical Anesthesia Plan  ASA: II  Anesthesia Plan: General   Post-op Pain Management:    Induction: Intravenous  PONV Risk Score and Plan: 1 and Ondansetron and Dexamethasone  Airway Management Planned: Oral ETT  Additional Equipment: None  Intra-op Plan:   Post-operative Plan: Extubation in OR  Informed Consent: I have reviewed the patients History and Physical, chart, labs and discussed the procedure including the risks, benefits and alternatives for the proposed anesthesia with the patient or authorized representative who has indicated his/her understanding and acceptance.     Dental advisory given  Plan  Discussed with: CRNA and Surgeon  Anesthesia Plan Comments: (See PAT note 02/01/19, Konrad Felix, PA-C)       Anesthesia Quick Evaluation

## 2019-02-08 ENCOUNTER — Observation Stay (HOSPITAL_COMMUNITY): Payer: 59

## 2019-02-08 ENCOUNTER — Observation Stay (HOSPITAL_COMMUNITY)
Admission: RE | Admit: 2019-02-08 | Discharge: 2019-02-10 | Disposition: A | Discharge status: Home / Self Care | Payer: 59 | Attending: Urology | Admitting: Urology

## 2019-02-08 ENCOUNTER — Other Ambulatory Visit: Payer: Self-pay

## 2019-02-08 ENCOUNTER — Encounter (HOSPITAL_COMMUNITY): Payer: Self-pay | Admitting: *Deleted

## 2019-02-08 ENCOUNTER — Ambulatory Visit (HOSPITAL_COMMUNITY): Payer: 59 | Admitting: Physician Assistant

## 2019-02-08 ENCOUNTER — Encounter (HOSPITAL_COMMUNITY)
Admission: RE | Disposition: A | Discharge status: Home / Self Care | Payer: Self-pay | Source: Home / Self Care | Attending: Urology

## 2019-02-08 DIAGNOSIS — Q638 Other specified congenital malformations of kidney: Secondary | ICD-10-CM | POA: Diagnosis not present

## 2019-02-08 DIAGNOSIS — E119 Type 2 diabetes mellitus without complications: Secondary | ICD-10-CM | POA: Insufficient documentation

## 2019-02-08 DIAGNOSIS — F17210 Nicotine dependence, cigarettes, uncomplicated: Secondary | ICD-10-CM | POA: Diagnosis not present

## 2019-02-08 DIAGNOSIS — Z960 Presence of urogenital implants: Secondary | ICD-10-CM

## 2019-02-08 DIAGNOSIS — I10 Essential (primary) hypertension: Secondary | ICD-10-CM | POA: Insufficient documentation

## 2019-02-08 DIAGNOSIS — C61 Malignant neoplasm of prostate: Principal | ICD-10-CM | POA: Insufficient documentation

## 2019-02-08 DIAGNOSIS — K3532 Acute appendicitis with perforation and localized peritonitis, without abscess: Secondary | ICD-10-CM | POA: Diagnosis not present

## 2019-02-08 HISTORY — PX: ROBOT ASSISTED LAPAROSCOPIC RADICAL PROSTATECTOMY: SHX5141

## 2019-02-08 HISTORY — PX: PELVIC LYMPH NODE DISSECTION: SHX6543

## 2019-02-08 LAB — HEMOGLOBIN AND HEMATOCRIT, BLOOD
HCT: 41.4 % (ref 39.0–52.0)
Hemoglobin: 13.8 g/dL (ref 13.0–17.0)

## 2019-02-08 LAB — BPAM RBC
Blood Product Expiration Date: 202005012359
Blood Product Expiration Date: 202005012359
Unit Type and Rh: 5100
Unit Type and Rh: 5100

## 2019-02-08 LAB — TYPE AND SCREEN
ABO/RH(D): O POS
Antibody Screen: POSITIVE
PT AG Type: NEGATIVE
Unit division: 0
Unit division: 0

## 2019-02-08 SURGERY — PROSTATECTOMY, RADICAL, ROBOT-ASSISTED, LAPAROSCOPIC
Anesthesia: General

## 2019-02-08 MED ORDER — SODIUM CHLORIDE (PF) 0.9 % IJ SOLN
INTRAMUSCULAR | Status: DC | PRN
  Administered 2019-02-08: 20 mL

## 2019-02-08 MED ORDER — FENTANYL CITRATE (PF) 100 MCG/2ML IJ SOLN
INTRAMUSCULAR | Status: AC
  Administered 2019-02-08: 50 ug via INTRAVENOUS
  Filled 2019-02-08: qty 2

## 2019-02-08 MED ORDER — FENTANYL CITRATE (PF) 100 MCG/2ML IJ SOLN
25.0000 ug | INTRAMUSCULAR | Status: DC | PRN
  Administered 2019-02-08: 14:00:00 50 ug via INTRAVENOUS

## 2019-02-08 MED ORDER — SODIUM CHLORIDE (PF) 0.9 % IJ SOLN
INTRAMUSCULAR | Status: AC
  Filled 2019-02-08: qty 20

## 2019-02-08 MED ORDER — SUGAMMADEX SODIUM 200 MG/2ML IV SOLN
INTRAVENOUS | Status: DC | PRN
  Administered 2019-02-08: 200 mg via INTRAVENOUS

## 2019-02-08 MED ORDER — PROPOFOL 10 MG/ML IV BOLUS
INTRAVENOUS | Status: DC | PRN
  Administered 2019-02-08: 180 mg via INTRAVENOUS

## 2019-02-08 MED ORDER — ONDANSETRON HCL 4 MG/2ML IJ SOLN
4.0000 mg | INTRAMUSCULAR | Status: DC | PRN
  Administered 2019-02-09 – 2019-02-10 (×2): 4 mg via INTRAVENOUS
  Filled 2019-02-08 (×2): qty 2

## 2019-02-08 MED ORDER — ONDANSETRON HCL 4 MG/2ML IJ SOLN
INTRAMUSCULAR | Status: AC
  Filled 2019-02-08: qty 2

## 2019-02-08 MED ORDER — MIDAZOLAM HCL 2 MG/2ML IJ SOLN
INTRAMUSCULAR | Status: AC
  Filled 2019-02-08: qty 2

## 2019-02-08 MED ORDER — LIDOCAINE 2% (20 MG/ML) 5 ML SYRINGE
INTRAMUSCULAR | Status: DC | PRN
  Administered 2019-02-08: 80 mg via INTRAVENOUS

## 2019-02-08 MED ORDER — ACETAMINOPHEN 500 MG PO TABS: 1000.0000 mg | ORAL_TABLET | Freq: Once | ORAL | Status: DC | PRN

## 2019-02-08 MED ORDER — PROPOFOL 10 MG/ML IV BOLUS
INTRAVENOUS | Status: AC
  Filled 2019-02-08: qty 20

## 2019-02-08 MED ORDER — INDIGOTINDISULFONATE SODIUM 8 MG/ML IJ SOLN
INTRAMUSCULAR | Status: DC | PRN
  Administered 2019-02-08: 5 mL via INTRAVENOUS

## 2019-02-08 MED ORDER — LACTATED RINGERS IR SOLN
Status: DC | PRN
  Administered 2019-02-08: 1000 mL

## 2019-02-08 MED ORDER — DEXAMETHASONE SODIUM PHOSPHATE 10 MG/ML IJ SOLN
INTRAMUSCULAR | Status: DC | PRN
  Administered 2019-02-08: 5 mg via INTRAVENOUS

## 2019-02-08 MED ORDER — HYDROMORPHONE HCL 1 MG/ML IJ SOLN
0.5000 mg | INTRAMUSCULAR | Status: DC | PRN
  Administered 2019-02-08: 1 mg via INTRAVENOUS
  Administered 2019-02-08: 0.5 mg via INTRAVENOUS
  Administered 2019-02-08: 1 mg via INTRAVENOUS
  Administered 2019-02-09: 22:00:00 0.5 mg via INTRAVENOUS
  Administered 2019-02-09 – 2019-02-10 (×4): 1 mg via INTRAVENOUS
  Filled 2019-02-08 (×8): qty 1

## 2019-02-08 MED ORDER — FENTANYL CITRATE (PF) 100 MCG/2ML IJ SOLN
INTRAMUSCULAR | Status: AC
  Filled 2019-02-08: qty 2

## 2019-02-08 MED ORDER — BACITRACIN-NEOMYCIN-POLYMYXIN 400-5-5000 EX OINT: 1.0000 "application " | TOPICAL_OINTMENT | Freq: Three times a day (TID) | CUTANEOUS | Status: DC | PRN

## 2019-02-08 MED ORDER — HYDROCODONE-ACETAMINOPHEN 5-325 MG PO TABS: 1.0000 | ORAL_TABLET | Freq: Four times a day (QID) | ORAL | 0 refills | Status: DC | PRN

## 2019-02-08 MED ORDER — BUPIVACAINE LIPOSOME 1.3 % IJ SUSP
20.0000 mL | Freq: Once | INTRAMUSCULAR | Status: AC
  Administered 2019-02-08: 20 mL
  Filled 2019-02-08: qty 20

## 2019-02-08 MED ORDER — SODIUM CHLORIDE 0.9 % IV BOLUS: 1000.0000 mL | Freq: Once | INTRAVENOUS | Status: DC

## 2019-02-08 MED ORDER — MIDAZOLAM HCL 2 MG/2ML IJ SOLN
1.0000 mg | Freq: Once | INTRAMUSCULAR | Status: AC
  Administered 2019-02-08 (×2): 0.5 mg via INTRAVENOUS

## 2019-02-08 MED ORDER — CEFAZOLIN SODIUM-DEXTROSE 2-4 GM/100ML-% IV SOLN
INTRAVENOUS | Status: AC
  Filled 2019-02-08: qty 100

## 2019-02-08 MED ORDER — ACETAMINOPHEN 10 MG/ML IV SOLN
1000.0000 mg | Freq: Once | INTRAVENOUS | Status: DC | PRN
  Administered 2019-02-08: 14:00:00 1000 mg via INTRAVENOUS

## 2019-02-08 MED ORDER — INDIGOTINDISULFONATE SODIUM 8 MG/ML IJ SOLN
INTRAMUSCULAR | Status: AC
  Filled 2019-02-08: qty 5

## 2019-02-08 MED ORDER — SUCCINYLCHOLINE CHLORIDE 20 MG/ML IJ SOLN
INTRAMUSCULAR | Status: DC | PRN
  Administered 2019-02-08: 120 mg via INTRAVENOUS

## 2019-02-08 MED ORDER — ROCURONIUM BROMIDE 10 MG/ML (PF) SYRINGE
PREFILLED_SYRINGE | INTRAVENOUS | Status: AC
  Filled 2019-02-08: qty 20

## 2019-02-08 MED ORDER — ACETAMINOPHEN 10 MG/ML IV SOLN
INTRAVENOUS | Status: AC
  Administered 2019-02-08: 1000 mg via INTRAVENOUS
  Filled 2019-02-08: qty 100

## 2019-02-08 MED ORDER — ONDANSETRON HCL 4 MG/2ML IJ SOLN
INTRAMUSCULAR | Status: DC | PRN
  Administered 2019-02-08: 4 mg via INTRAVENOUS

## 2019-02-08 MED ORDER — DIPHENHYDRAMINE HCL 50 MG/ML IJ SOLN: 12.5000 mg | Freq: Four times a day (QID) | INTRAMUSCULAR | Status: DC | PRN

## 2019-02-08 MED ORDER — LACTATED RINGERS IV SOLN
INTRAVENOUS | Status: DC
  Administered 2019-02-08 (×3): via INTRAVENOUS

## 2019-02-08 MED ORDER — FENTANYL CITRATE (PF) 100 MCG/2ML IJ SOLN
INTRAMUSCULAR | Status: DC | PRN
  Administered 2019-02-08: 100 ug via INTRAVENOUS
  Administered 2019-02-08: 50 ug via INTRAVENOUS
  Administered 2019-02-08 (×2): 100 ug via INTRAVENOUS
  Administered 2019-02-08 (×2): 50 ug via INTRAVENOUS
  Administered 2019-02-08: 100 ug via INTRAVENOUS

## 2019-02-08 MED ORDER — PHENYLEPHRINE 40 MCG/ML (10ML) SYRINGE FOR IV PUSH (FOR BLOOD PRESSURE SUPPORT)
PREFILLED_SYRINGE | INTRAVENOUS | Status: DC | PRN
  Administered 2019-02-08 (×2): 120 ug via INTRAVENOUS
  Administered 2019-02-08: 80 ug via INTRAVENOUS

## 2019-02-08 MED ORDER — MIDAZOLAM HCL 2 MG/2ML IJ SOLN
INTRAMUSCULAR | Status: AC
  Administered 2019-02-08: 13:00:00 0.5 mg via INTRAVENOUS
  Filled 2019-02-08: qty 2

## 2019-02-08 MED ORDER — ACETAMINOPHEN 325 MG PO TABS: 650.0000 mg | ORAL_TABLET | ORAL | Status: DC | PRN

## 2019-02-08 MED ORDER — ACETAMINOPHEN 160 MG/5ML PO SOLN: 1000.0000 mg | Freq: Once | ORAL | Status: DC | PRN

## 2019-02-08 MED ORDER — BELLADONNA ALKALOIDS-OPIUM 16.2-60 MG RE SUPP
1.0000 | Freq: Four times a day (QID) | RECTAL | Status: DC | PRN
  Administered 2019-02-08: 1 via RECTAL
  Filled 2019-02-08: qty 1

## 2019-02-08 MED ORDER — OXYCODONE HCL 5 MG PO TABS
5.0000 mg | ORAL_TABLET | ORAL | Status: DC | PRN
  Administered 2019-02-08 – 2019-02-10 (×7): 5 mg via ORAL
  Filled 2019-02-08 (×7): qty 1

## 2019-02-08 MED ORDER — STERILE WATER FOR IRRIGATION IR SOLN
Status: DC | PRN
  Administered 2019-02-08: 1000 mL

## 2019-02-08 MED ORDER — MIDAZOLAM HCL 5 MG/5ML IJ SOLN
INTRAMUSCULAR | Status: DC | PRN
  Administered 2019-02-08: 2 mg via INTRAVENOUS

## 2019-02-08 MED ORDER — PHENYLEPHRINE 40 MCG/ML (10ML) SYRINGE FOR IV PUSH (FOR BLOOD PRESSURE SUPPORT)
PREFILLED_SYRINGE | INTRAVENOUS | Status: AC
  Filled 2019-02-08: qty 10

## 2019-02-08 MED ORDER — OXYCODONE HCL 5 MG PO TABS: 5.0000 mg | ORAL_TABLET | Freq: Once | ORAL | Status: DC | PRN

## 2019-02-08 MED ORDER — DOCUSATE SODIUM 100 MG PO CAPS
100.0000 mg | ORAL_CAPSULE | Freq: Two times a day (BID) | ORAL | Status: DC
  Administered 2019-02-08 – 2019-02-10 (×4): 100 mg via ORAL
  Filled 2019-02-08 (×4): qty 1

## 2019-02-08 MED ORDER — SUCCINYLCHOLINE CHLORIDE 200 MG/10ML IV SOSY
PREFILLED_SYRINGE | INTRAVENOUS | Status: AC
  Filled 2019-02-08: qty 10

## 2019-02-08 MED ORDER — FENTANYL CITRATE (PF) 250 MCG/5ML IJ SOLN
INTRAMUSCULAR | Status: AC
  Filled 2019-02-08: qty 5

## 2019-02-08 MED ORDER — ROCURONIUM BROMIDE 10 MG/ML (PF) SYRINGE
PREFILLED_SYRINGE | INTRAVENOUS | Status: DC | PRN
  Administered 2019-02-08 (×3): 10 mg via INTRAVENOUS
  Administered 2019-02-08: 20 mg via INTRAVENOUS
  Administered 2019-02-08: 10 mg via INTRAVENOUS
  Administered 2019-02-08: 50 mg via INTRAVENOUS

## 2019-02-08 MED ORDER — CEFAZOLIN SODIUM-DEXTROSE 2-4 GM/100ML-% IV SOLN
2.0000 g | INTRAVENOUS | Status: AC
  Administered 2019-02-08: 08:00:00 2 g via INTRAVENOUS

## 2019-02-08 MED ORDER — DEXTROSE-NACL 5-0.45 % IV SOLN
INTRAVENOUS | Status: DC
  Administered 2019-02-08 – 2019-02-09 (×2): via INTRAVENOUS

## 2019-02-08 MED ORDER — DIPHENHYDRAMINE HCL 12.5 MG/5ML PO ELIX: 12.5000 mg | ORAL_SOLUTION | Freq: Four times a day (QID) | ORAL | Status: DC | PRN

## 2019-02-08 MED ORDER — OXYCODONE HCL 5 MG/5ML PO SOLN: 5.0000 mg | Freq: Once | ORAL | Status: DC | PRN

## 2019-02-08 SURGICAL SUPPLY — 69 items
ADH SKN CLS APL DERMABOND .7 (GAUZE/BANDAGES/DRESSINGS) ×2
AGENT HMST KT MTR STRL THRMB (HEMOSTASIS) ×2
APL ESCP 34 STRL LF DISP (HEMOSTASIS) ×2
APL PRP STRL LF DISP 70% ISPRP (MISCELLANEOUS) ×2
APL SWBSTK 6 STRL LF DISP (MISCELLANEOUS) ×2
APPLICATOR COTTON TIP 6 STRL (MISCELLANEOUS) ×2 IMPLANT
APPLICATOR COTTON TIP 6IN STRL (MISCELLANEOUS) ×4
APPLICATOR SURGIFLO ENDO (HEMOSTASIS) ×2 IMPLANT
CATH FOLEY 2WAY SLVR  5CC 18FR (CATHETERS) ×2
CATH FOLEY 2WAY SLVR 5CC 18FR (CATHETERS) ×2 IMPLANT
CATH TIEMANN FOLEY 18FR 5CC (CATHETERS) ×4 IMPLANT
CHLORAPREP W/TINT 26 (MISCELLANEOUS) ×4 IMPLANT
CLIP VESOLOCK LG 6/CT PURPLE (CLIP) ×8 IMPLANT
COVER SURGICAL LIGHT HANDLE (MISCELLANEOUS) ×4 IMPLANT
COVER TIP SHEARS 8 DVNC (MISCELLANEOUS) ×2 IMPLANT
COVER TIP SHEARS 8MM DA VINCI (MISCELLANEOUS) ×2
COVER WAND RF STERILE (DRAPES) IMPLANT
CUTTER ECHEON FLEX ENDO 45 340 (ENDOMECHANICALS) ×4 IMPLANT
DECANTER SPIKE VIAL GLASS SM (MISCELLANEOUS) ×4 IMPLANT
DERMABOND ADVANCED (GAUZE/BANDAGES/DRESSINGS) ×2
DERMABOND ADVANCED .7 DNX12 (GAUZE/BANDAGES/DRESSINGS) IMPLANT
DRAPE ARM DVNC X/XI (DISPOSABLE) ×8 IMPLANT
DRAPE COLUMN DVNC XI (DISPOSABLE) ×2 IMPLANT
DRAPE DA VINCI XI ARM (DISPOSABLE) ×8
DRAPE DA VINCI XI COLUMN (DISPOSABLE) ×2
DRAPE SURG IRRIG POUCH 19X23 (DRAPES) ×4 IMPLANT
DRSG TEGADERM 4X4.75 (GAUZE/BANDAGES/DRESSINGS) ×4 IMPLANT
ELECT PENCIL ROCKER SW 15FT (MISCELLANEOUS) IMPLANT
ELECT REM PT RETURN 15FT ADLT (MISCELLANEOUS) ×4 IMPLANT
GLOVE BIO SURGEON STRL SZ 6.5 (GLOVE) ×3 IMPLANT
GLOVE BIO SURGEON STRL SZ7.5 (GLOVE) ×8 IMPLANT
GLOVE BIO SURGEONS STRL SZ 6.5 (GLOVE) ×1
GOWN STRL REUS W/TWL LRG LVL3 (GOWN DISPOSABLE) ×4 IMPLANT
GOWN STRL REUS W/TWL XL LVL3 (GOWN DISPOSABLE) ×8 IMPLANT
GUIDEWIRE ANG ZIPWIRE 038X150 (WIRE) ×2 IMPLANT
GUIDEWIRE STR DUAL SENSOR (WIRE) ×4 IMPLANT
HEMOSTAT SURGICEL 4X8 (HEMOSTASIS) IMPLANT
HOLDER FOLEY CATH W/STRAP (MISCELLANEOUS) ×4 IMPLANT
IRRIG SUCT STRYKERFLOW 2 WTIP (MISCELLANEOUS) ×4
IRRIGATION SUCT STRKRFLW 2 WTP (MISCELLANEOUS) ×2 IMPLANT
IV LACTATED RINGERS 1000ML (IV SOLUTION) ×4 IMPLANT
KIT TURNOVER KIT A (KITS) IMPLANT
NDL INSUFFLATION 14GA 120MM (NEEDLE) ×2 IMPLANT
NEEDLE INSUFFLATION 14GA 120MM (NEEDLE) ×4 IMPLANT
PACK ROBOT UROLOGY CUSTOM (CUSTOM PROCEDURE TRAY) ×4 IMPLANT
PAD POSITIONING PINK XL (MISCELLANEOUS) ×4 IMPLANT
PORT ACCESS TROCAR AIRSEAL 12 (TROCAR) ×2 IMPLANT
PORT ACCESS TROCAR AIRSEAL 5M (TROCAR) ×2
RELOAD STAPLE 45 4.1 GRN THCK (STAPLE) ×2 IMPLANT
SEAL CANN UNIV 5-8 DVNC XI (MISCELLANEOUS) ×8 IMPLANT
SEAL XI 5MM-8MM UNIVERSAL (MISCELLANEOUS) ×8
SET TRI-LUMEN FLTR TB AIRSEAL (TUBING) ×4 IMPLANT
SOLUTION ELECTROLUBE (MISCELLANEOUS) ×4 IMPLANT
SPONGE LAP 4X18 RFD (DISPOSABLE) ×4 IMPLANT
STAPLE RELOAD 45 GRN (STAPLE) ×2 IMPLANT
STAPLE RELOAD 45MM GREEN (STAPLE) ×4
STENT PERCUFLEX 4.8FRX26 (STENTS) ×2 IMPLANT
SURGIFLO W/THROMBIN 8M KIT (HEMOSTASIS) ×2 IMPLANT
SUT ETHILON 3 0 PS 1 (SUTURE) ×4 IMPLANT
SUT MNCRL AB 4-0 PS2 18 (SUTURE) ×8 IMPLANT
SUT VIC AB 0 UR5 27 (SUTURE) ×4 IMPLANT
SUT VIC AB 2-0 SH 27 (SUTURE) ×4
SUT VIC AB 2-0 SH 27X BRD (SUTURE) ×2 IMPLANT
SUT VICRYL 0 UR6 27IN ABS (SUTURE) ×8 IMPLANT
SUT VLOC BARB 180 ABS3/0GR12 (SUTURE) ×8
SUTURE VLOC BRB 180 ABS3/0GR12 (SUTURE) ×4 IMPLANT
TOWEL OR NON WOVEN STRL DISP B (DISPOSABLE) ×4 IMPLANT
TUBE FEEDING 8FR 16IN STR KANG (MISCELLANEOUS) ×2 IMPLANT
WATER STERILE IRR 1000ML POUR (IV SOLUTION) ×4 IMPLANT

## 2019-02-08 NOTE — Discharge Instructions (Signed)

## 2019-02-08 NOTE — Progress Notes (Addendum)
Patient dangled at edge of bed.  Patient reported "feeling dizzy".  VS obtained - BP lower than previous.  Patient wanted to continue to sit on edge of bed for a few minutes.  Patient reported that dizziness went away.  Patient returned to lying in bed.  Denies dizziness.  No new drainage at JP site.  450 ml of tea colored urine emptied from foley catheter.  Urine in catheter tubing now a lighter color yellow/amber.  Will continue to monitor.

## 2019-02-08 NOTE — Op Note (Signed)
Operative Note  Preoperative diagnosis:  1.  Prostate cancer   Postoperative diagnosis: 1.  Prostate cancer 2.  Left complete renal duplicated system   Procedure(s): 1.  Robotic assisted laparoscopic prostatectomy  2.  Left ureteral stent placement  Surgeon: Link Snuffer, MD  Assistants: Debbrah Alar, PA--an assistant was needed due to the nature of the case being a robotic surgery requiring a bedside assistant for retraction, suctioning, exchanging instruments, etc.   Resident: Graylon Gunning, MD  Anesthesia: General   Complications: None   EBL: 150cc   Specimens: 1. prostate and seminal vesicles  Drains/Catheters: 1. 43 French Foley catheter and JP drain   Intraoperative findings: prostate and seminal vesicles were removed en bloc Without any evidence of gross extraprostatic disease. 2.  There was a duplicated renal collecting system on the left.  The left ureter, 1 of them, was very close to the bladder neck.  There is no evidence of a duplicated system on the right.  Identified the left ureteral orifice that was close to the bladder neck as well as the normal trigone.  Since 1 of them was very close to the divided bladder neck and therefore decision was made to place a ureteral stent.  Indication: 63 year old male with newly diagnosed prostate cancer elected to undergo the above operation.  Description of procedure:  The patient was identified and consent was obtained.  The patient was taken to the operating room and placed in the supine position.  The patient was placed under general anesthesia.  Perioperative antibiotics were administered.  The patient was placed in dorsal lithotomy.  Patient was prepped and draped in a standard sterile fashion and a timeout was performed.  The patient was placed in steep Trendelenburg.  Foley catheter was placed.  Veress needle was inserted supraumbilical and the drop test was performed with no evidence of any injury.  The abdomen  was then insufflated to a pressure of 15.  4 robotic working ports, a 12 mm assistant port, and a 5 mm assistant port were placed under direct visualization in a standard fashion for a robotic pelvic surgery.  The abdomen was inspected and there was no evidence of any visceral or vascular injury.  I first released colonic adhesions in the left lower quadrant.  I then retracted the sigmoid and rectum superiorly. The bladder was then dropped by incising along the medial umbilical ligament bilaterally.  Periprosthetic fat was cleared off the prostate and passed off and discarded.  The endopelvic fascia was incised bilaterally and the puboprostatic ligaments were released.  A stapler with a vascular staple load was used to staple the dorsal venous complex.  I then incised along into the bladder neck and divided the bladder neck.  The catheter was brought out and used for retraction.  Upon inspection of the posterior bladder neck, I noted efflux of urine from the ureteral orifice.  This was unusually close to the divided bladder neck.  I therefore gave indigocarmine and noted that there was blue efflux from that area.  However, I inspected the remainder of the bladder and noted there to be another left ureteral orifice as well as the right ureteral orifice at the true trigonal area.  The accessory ureter was very close to the excised bladder neck therefore I felt it would be necessary to place a ureteral stent to allow for proper healing and to help prevent obstruction.  I held off on stent placement until completing the prostatectomy.  I divided the remainder of  the bladder neck and then divided the vas deferens and dissected out the seminal vesicles.  I then pulled the seminal vesicles out of this incision.  Careful dissection was performed and bilateral prostatic pedicles were released using Hem-o-lok clips and sharp dissection.  Spot cautery was sparingly used.  Periprosthetic tissue was carefully released  inferiorly and laterally, performing a nerve sparing operation bilaterally.  Once only the urethra remained attached, the anterior portion of the urethra was divided.  A 2-0 Vicryl stitch was placed at the 6 o'clock position.  The remainder of the urethra was divided and the prostate was placed in a specimen bag.   I again inspected the bladder.  Identified the accessory ureteral orifice and passed a Glidewire up the ureter.  I then placed a 4.8 x 26 double-J ureteral stent over the wire and remove the wire.  There was a good coil at the end.   The 2-0 Vicryl stitch was used to reapproximate the bladder and urethra at the 6 o'clock position.  A running 3-0 V lock suture was then used to reapproximate the bladder and urethra.  The bladder was filled with normal saline and there was no evidence of any leak at the anastomosis.  The anastomosis was watertight.  The Foley catheter was exchanged for a fresh catheter.  A JP drain was inserted from the left lateral port site.  This was secured down with a nylon stitch.  The robot was undocked and the patient was taken out of Trendelenburg.  All working ports were removed under direct visualization with the camera to ensure there was no bleeding from the sites.  The midline port incision was extended and the prostate extracted in the specimen bag.  Interrupted figure-of-eight 0 Vicryl sutures were used to close  the fascia.  The 12 mm assistant port was also closed with a 2-0 Vicryl.  Skin was then closed with 4-0 Monocryl and Dermabond.  Exparel was used for anesthetic effect.  This concluded the operation.  The patient tolerated procedure well and was stable postoperatively.  Plan: Will obtain stat labs.  He will remain in the hospital overnight and hopefully be able to be discharged tomorrow.  He will keep his catheter for 7-10 days.  He will need to keep his ureteral stent for 6 weeks.

## 2019-02-08 NOTE — H&P (Signed)
CC/HPI: Pt presents today for pre-operative history and physical exam in anticipation of robotic assisted lap prostatectomy with BPLND on 02/08/19 by Dr. Gloriann Loan. He is doing well and is without complaint. Pt denies F/C, HA, CP, SOB, N/V, diarrhea/constipation, flank pain, hematuria, and dysuria.    HX:   Cc: Prostate cancer  HPI:  04/21/2018:  Patient presents today for follow-up after undergoing a prostate biopsy on 04/14/2018.   PSA 5.67  TRUS size 26.21   Unfortunately, pathology revealed Gleason 3+3 adenocarcinoma the prostate in 2 out of 12 cores with up to 10% involvement and 1 core of atypia. All other cores were negative for malignancy. This classifies him as very low risk prostate cancer. He has no complaints since the biopsy.   11/11/2018  Patient recently underwent an MRI of the prostate revealed that revealed a PIRADs 3 lesion. He presents for MRI/ultrasound fusion guided biopsy   11/19/2018  Patient presents today after undergoing an MRI/ultrasound fusion guided biopsy. Unfortunately, biopsy revealed upstaging to 3+4 adenocarcinoma the prostate in 1 out of 12 cores. The fusion guided biopsy of the lesion was negative. He had 2 other cores of Gleason 3+3. Most recent PSA was 5.12 on 11/11/2018.   TRUS size 26.2  He has had a laparoscopic appendectomy for a ruptured appendix  He has erectile dysfunction but they are not really sexually active  No urinary incontinence   12/23/2018  Patient met with Dr. Tammi Klippel. He was initially undecided. After careful discussion, he has elected to undergo robotic-assisted laparoscopic prostatectomy.     ALLERGIES: fentanyl - Skin Rash Neurontin - trouble walking and talking    MEDICATIONS: None   GU PSH: Prostate Needle Biopsy - 11/11/2018, 04/14/2018      PSH Notes: L4-5 laminectomy   NON-GU PSH: Appendectomy (laparoscopic) - 08/14/2018 Insertion Of Chest Tube - 1991 Surgical Pathology, Gross And Microscopic Examination For Prostate Needle -  11/11/2018, 04/14/2018 Wrist Arthroscopy/surgery, Right    GU PMH: Prostate Cancer - 11/11/2018, - 04/21/2018 Elevated PSA - 04/14/2018, - 03/19/2018    NON-GU PMH: Muscle weakness (generalized) - 01/04/2019 Depression Diabetes Type 2 Hypercholesterolemia Hypertension Legionnaires'' disease    FAMILY HISTORY: 3 daughters - Daughter 3 Son's - Son Death - Father, Mother   SOCIAL HISTORY: Marital Status: Married Preferred Language: English; Ethnicity: Not Hispanic Or Latino; Race: White Current Smoking Status: Patient smokes. Smokes 2 packs per day.   Tobacco Use Assessment Completed: Used Tobacco in last 30 days? Smoking cessation counseling was provided. Does not use smokeless tobacco. Moderate Drinker.  Does not use drugs. Drinks 1 caffeinated drink per day. Has not had a blood transfusion. 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     Notes: Beer or two on occasion    REVIEW OF SYSTEMS:    GU Review Male:   Patient denies frequent urination, hard to postpone urination, burning/ pain with urination, get up at night to urinate, leakage of urine, stream starts and stops, trouble starting your stream, have to strain to urinate , erection problems, and penile pain.  Gastrointestinal (Upper):   Patient denies nausea, indigestion/ heartburn, and vomiting.  Gastrointestinal (Lower):   Patient denies diarrhea and constipation.  Constitutional:   Patient denies fever, night sweats, weight loss, and fatigue.  Skin:   Patient denies skin rash/ lesion and itching.  Eyes:   Patient denies blurred vision and double vision.  Ears/ Nose/ Throat:   Patient denies sore throat and sinus problems.  Hematologic/Lymphatic:   Patient denies swollen glands and easy  bruising.  Cardiovascular:   Patient denies leg swelling and chest pains.  Respiratory:   Patient denies cough and shortness of breath.  Endocrine:   Patient denies excessive thirst.  Musculoskeletal:   Patient reports back pain. Patient denies  joint pain.  Neurological:   Patient denies headaches and dizziness.  Psychologic:   Patient denies depression and anxiety.   VITAL SIGNS:      01/26/2019 02:55 PM  Weight 210 lb / 95.25 kg  Height 70 in / 177.8 cm  BP 134/74 mmHg  Pulse 73 /min  Temperature 97.9 F / 36.6 C  BMI 30.1 kg/m   MULTI-SYSTEM PHYSICAL EXAMINATION:    Constitutional: Well-nourished. No physical deformities. Normally developed. Good grooming.  Neck: Neck symmetrical, not swollen. Normal tracheal position. Right carotid bruit  Respiratory: Normal breath sounds. No labored breathing, no use of accessory muscles.   Cardiovascular: Regular rate and rhythm. No murmur, no gallop. Normal temperature, normal extremity pulses, no swelling.   Lymphatic: No enlargement of neck, axillae, groin.  Skin: No paleness, no jaundice, no cyanosis. No lesion, no ulcer, no rash.  Neurologic / Psychiatric: Oriented to time, oriented to place, oriented to person. No depression, no anxiety, no agitation.  Gastrointestinal: No mass, no tenderness, no rigidity, non obese abdomen.  Eyes: Normal conjunctivae. Normal eyelids.  Ears, Nose, Mouth, and Throat: Left ear no scars, no lesions, no masses. Right ear no scars, no lesions, no masses. Nose no scars, no lesions, no masses. Normal hearing. Normal lips.  Musculoskeletal: Normal gait and station of head and neck.     PAST DATA REVIEWED:  Source Of History:  Patient  Records Review:   Previous Patient Records  Urine Test Review:   Urinalysis   01/26/19  Urinalysis  Urine Appearance Clear   Urine Color Amber   Urine Glucose Neg mg/dL  Urine Bilirubin Neg mg/dL  Urine Ketones Neg mg/dL  Urine Specific Gravity 1.020   Urine Blood Neg ery/uL  Urine pH 7.0   Urine Protein 1+ mg/dL  Urine Urobilinogen 1.0 mg/dL  Urine Nitrites Neg   Urine Leukocyte Esterase Neg leu/uL  Urine WBC/hpf 0 - 5/hpf   Urine RBC/hpf 3 - 10/hpf   Urine Epithelial Cells NS (Not Seen)   Urine Bacteria NS  (Not Seen)   Urine Mucous Present   Urine Yeast NS (Not Seen)   Urine Trichomonas Not Present   Urine Cystals NS (Not Seen)   Urine Casts NS (Not Seen)   Urine Sperm Not Present    PROCEDURES:          Urinalysis w/Scope - 81001 Dipstick Dipstick Cont'd Micro  Color: Amber Bilirubin: Neg mg/dL WBC/hpf: 0 - 5/hpf  Appearance: Clear Ketones: Neg mg/dL RBC/hpf: 3 - 10/hpf  Specific Gravity: 1.020 Blood: Neg ery/uL Bacteria: NS (Not Seen)  pH: 7.0 Protein: 1+ mg/dL Cystals: NS (Not Seen)  Glucose: Neg mg/dL Urobilinogen: 1.0 mg/dL Casts: NS (Not Seen)    Nitrites: Neg Trichomonas: Not Present    Leukocyte Esterase: Neg leu/uL Mucous: Present      Epithelial Cells: NS (Not Seen)      Yeast: NS (Not Seen)      Sperm: Not Present    ASSESSMENT:      ICD-10 Details  1 GU:   Prostate Cancer - C61    PLAN:           Schedule Return Visit/Planned Activity: Keep Scheduled Appointment - Schedule Surgery  Document Letter(s):  Created for Patient: Clinical Summary         Notes:   There are no changes in the patients history since last evaluation by Dr. Gloriann Loan. Pt is scheduled to undergo RALP with BPLND on 02/08/19.   Pt has a right carotid bruit. Instructed pt to call his PCP to let him know about the bruit so that a carotid duplex can be arranged as soon as possible. He currently denies all s/sx of TIA/CVA. He voiced understanding and agreed.   All pt's questions were answered to the best of my ability.          Next Appointment:      Next Appointment: 02/08/2019 07:15 AM    Appointment Type: Surgery     Location: [facility] [phone]    Provider: Link Snuffer, III, M.D.    Reason for Visit: OBS WL ROB AST LAP PROSTATECTOMY      Signed by Mcarthur Rossetti, PA on 01/26/19 at 3:51 PM (EDT

## 2019-02-08 NOTE — Transfer of Care (Signed)
Immediate Anesthesia Transfer of Care Note  Patient: Tyrone [name]  Procedure(s) Performed: XI ROBOTIC ASSISTED LAPAROSCOPIC RADICAL PROSTATECTOMY (N/A ) PELVIC LYMPH NODE DISSECTION (Bilateral )  Patient Location: PACU  Anesthesia Type:General  Level of Consciousness: sedated, drowsy and patient cooperative  Airway & Oxygen Therapy: Patient Spontanous Breathing and Patient connected to face mask oxygen  Post-op Assessment: Report given to RN and Post -op Vital signs reviewed and stable  Post vital signs: Reviewed and stable  Last Vitals:  Vitals Value Taken Time  BP 139/107 02/08/2019 12:17 PM  Temp    Pulse    Resp 16 02/08/2019 12:17 PM  SpO2    Vitals shown include unvalidated device data.  Last Pain:  Vitals:   02/08/19 0546  TempSrc: Oral      Patients Stated Pain Goal: 4 (54/27/06 2376)  Complications: No apparent anesthesia complications

## 2019-02-08 NOTE — Interval H&P Note (Signed)
History and Physical Interval Note:  02/08/2019 7:24 AM  Tyrone [name]  has presented today for surgery, with the diagnosis of PROSTATE CANCER.  The various methods of treatment have been discussed with the patient and family. After consideration of risks, benefits and other options for treatment, the patient has consented to  Procedure(s): XI ROBOTIC ASSISTED LAPAROSCOPIC RADICAL PROSTATECTOMY (N/A) PELVIC LYMPH NODE DISSECTION (Bilateral) as a surgical intervention.  The patient's history has been reviewed, patient examined, no change in status, stable for surgery.  I have reviewed the patient's chart and labs.  Questions were answered to the patient's satisfaction.     Marton Redwood, III

## 2019-02-08 NOTE — Progress Notes (Signed)
Urology PA on unit and notified of patient's BP, urine output and JP drainage with dressing reinforced.

## 2019-02-08 NOTE — Anesthesia Procedure Notes (Signed)
Procedure Name: Intubation Performed by: Gean Maidens, CRNA Pre-anesthesia Checklist: Patient identified, Emergency Drugs available, Suction available, Patient being monitored and Timeout performed Patient Re-evaluated:Patient Re-evaluated prior to induction Oxygen Delivery Method: Circle system utilized Preoxygenation: Pre-oxygenation with 100% oxygen Induction Type: IV induction and Rapid sequence Laryngoscope Size: Mac and 4 Grade View: Grade II Tube type: Oral Tube size: 7.5 mm Number of attempts: 1 Airway Equipment and Method: Stylet Placement Confirmation: ETT inserted through vocal cords under direct vision,  positive ETCO2 and breath sounds checked- equal and bilateral Secured at: 23 cm Tube secured with: Tape Dental Injury: Teeth and Oropharynx as per pre-operative assessment

## 2019-02-08 NOTE — Progress Notes (Signed)
Pt transported to room with belongings bag inside personal blue bag.  Phone seen in bag

## 2019-02-09 ENCOUNTER — Encounter (HOSPITAL_COMMUNITY): Payer: Self-pay | Admitting: Urology

## 2019-02-09 DIAGNOSIS — C61 Malignant neoplasm of prostate: Secondary | ICD-10-CM | POA: Diagnosis not present

## 2019-02-09 LAB — TYPE AND SCREEN
ABO/RH(D): O POS
Antibody Screen: POSITIVE
Unit division: 0
Unit division: 0

## 2019-02-09 LAB — HEMOGLOBIN AND HEMATOCRIT, BLOOD
HCT: 35.7 % — ABNORMAL LOW (ref 39.0–52.0)
Hemoglobin: 11.9 g/dL — ABNORMAL LOW (ref 13.0–17.0)

## 2019-02-09 LAB — BASIC METABOLIC PANEL
Anion gap: 6 (ref 5–15)
BUN: 11 mg/dL (ref 8–23)
CO2: 25 mmol/L (ref 22–32)
Calcium: 7.8 mg/dL — ABNORMAL LOW (ref 8.9–10.3)
Chloride: 104 mmol/L (ref 98–111)
Creatinine, Ser: 0.91 mg/dL (ref 0.61–1.24)
GFR calc Af Amer: >60 mL/min (ref >60)
GFR calc non Af Amer: >60 mL/min (ref >60)
Glucose, Bld: 133 mg/dL — ABNORMAL HIGH (ref 70–99)
Potassium: 4.3 mmol/L (ref 3.5–5.1)
Sodium: 135 mmol/L (ref 135–145)

## 2019-02-09 LAB — BPAM RBC
Blood Product Expiration Date: 202005012359
Blood Product Expiration Date: 202005012359
Unit Type and Rh: 5100
Unit Type and Rh: 5100

## 2019-02-09 MED ORDER — SENNA 8.6 MG PO TABS
1.0000 | ORAL_TABLET | Freq: Every day | ORAL | Status: DC
  Administered 2019-02-09: 21:00:00 8.6 mg via ORAL
  Filled 2019-02-09: qty 1

## 2019-02-09 MED ORDER — ACETAMINOPHEN 500 MG PO TABS
1000.0000 mg | ORAL_TABLET | Freq: Four times a day (QID) | ORAL | Status: DC
  Administered 2019-02-09: 11:00:00 1000 mg via ORAL
  Filled 2019-02-09 (×3): qty 2

## 2019-02-09 NOTE — Progress Notes (Signed)
Patient ambulated in hallway this morning.  Patient complained some of nausea but reported that it went away after rest.  Patient now resting in chair.

## 2019-02-09 NOTE — Discharge Summary (Addendum)
[facility] Discharge Summary  Admit date: 02/08/2019  Discharge date and time: 02/10/19   Discharge to: Home  Discharge Service: Urology  Discharge Attending Physician:  Link Snuffer, MD  Discharge  Diagnoses: Prostate cancer  Secondary Diagnosis: Active Problems:   Prostate cancer [facility])   OR Procedures: Procedure(s): XI ROBOTIC ASSISTED LAPAROSCOPIC RADICAL PROSTATECTOMY PELVIC LYMPH NODE DISSECTION 02/08/2019   Ancillary Procedures: None   Discharge Day Services: The patient was seen and examined by the Urology team both in the morning and immediately prior to discharge.  Vital signs and laboratory values were stable and within normal limits.  The physical exam was benign and unchanged and all surgical wounds were examined.  Discharge instructions were explained and all questions answered.  Subjective  No acute events overnight. Pain Controlled. No fever or chills.  Objective Patient Vitals for the past 8 hrs:  BP Temp Temp src Pulse Resp SpO2  02/10/19 0332 (!) 151/77 97.8 F (36.6 C) Oral 86 20 92 %   No intake/output data recorded.  General Appearance:        No acute distress Lungs:                       Normal work of breathing on room air Heart:                                Regular rate and rhythm Abdomen:                         Soft, appropriately tender, mildly-distended, incisions c/d/i, JP w/SS drainage, minimal Extremities:                      Warm and well perfused   Hospital Course:  The patient underwent RALP with left ureteral stent placement on 02/08/2019.  The patient tolerated the procedure well, was extubated in the OR, and afterwards was taken to the PACU for routine post-surgical care. When stable the patient was transferred to the floor.   The patient did well postoperatively.  The patient's diet was slowly advanced and at the time of discharge was tolerating a regular diet.The patient was discharged home 2 Day Post-Op, at which point was  tolerating a regular solid diet, have adequate pain control with P.O. pain medication, and could ambulate without difficulty. JP was removed prior to dishcarge after displaying low output. He will be discharged with the foley catheter in place. The patient will follow up with [place] for post op check.   Condition at Discharge: Improved  Discharge Medications:  Allergies as of 02/10/2019      Reactions   Neurontin [gabapentin] Itching   Confusion and sedation.      Medication List    STOP taking these medications   Multivitamin Adult Tabs     TAKE these medications   HYDROcodone-acetaminophen 5-325 MG tablet Commonly known as:  Norco Take 1-2 tablets by mouth every 6 (six) hours as needed for moderate pain or severe pain.   oxybutynin 5 MG tablet Commonly known as:  DITROPAN Take 1 tablet (5 mg total) by mouth 3 (three) times daily as needed for bladder spasms. Stop 24 hours prior to follow-up appointment.

## 2019-02-09 NOTE — Progress Notes (Addendum)
Urology Progress Note   1 Day Post-Op s/p RALP  Subjective: NAEON. Pain controlled with pain medications. Tolerating CLD; transient nausea early this morning which has resolved (not related to CLD). Passing flatus. Not yet ambulated due to dizziness last night which has resolved.   Objective: Vital signs in last 24 hours: Temp:  [97.7 F (36.5 C)-99.1 F (37.3 C)] 98.4 F (36.9 C) (04/28 0641) Pulse Rate:  [79-103] 79 (04/28 0641) Resp:  [15-18] 16 (04/28 0641) BP: (105-186)/(59-107) 138/62 (04/28 0641) SpO2:  [88 %-100 %] 91 % (04/28 0819)  Intake/Output from previous day: 04/27 0701 - 04/28 0700 In: 3207.4 [P.O.:400; I.V.:2807.4] Out: 2110 [Urine:1585; Drains:175; Blood:350] Intake/Output this shift: No intake/output data recorded.  Physical Exam:  General: Alert and oriented CV: HDS, regular rate Lungs: NWOB on RA Abdomen: Soft, appropriately tender. Mildly distended. Incisions c/d/i. GU: Foley in place draining clear yellow urine  Ext: NT, No erythema  Lab Results: Recent Labs    02/08/19 1432 02/09/19 0500  HGB 13.8 11.9*  HCT 41.4 35.7*   BMET Recent Labs    02/09/19 0500  NA 135  K 4.3  CL 104  CO2 25  GLUCOSE 133*  BUN 11  CREATININE 0.91  CALCIUM 7.8*     Studies/Results: Dg Abd 1 View  Result Date: 02/08/2019 CLINICAL DATA:  Status post stent placement EXAM: ABDOMEN - 1 VIEW COMPARISON:  CT scan August 27, 2018 FINDINGS: A left ureteral stent has been placed. The proximal stent projects over the upper left renal shadow and the pigtail is not completely formed. The distal stent projects over the lower pelvis, likely in the lower bladder. There is a calcification projected inferior to the proximal aspect of the stent, likely a renal stone. There is a calcification on the right which may represent a right renal stone. Some sort of catheter tube overlies the central pelvis. IMPRESSION: 1. A left ureteral stent has been placed with the proximal aspect  projected over the upper left renal shadow and the distal aspect projected over the lower pelvis, likely within the bladder. The pigtail at the proximal aspect of the stent is not completely formed. 2. Probable renal stones. Electronically Signed   By: Dorise Bullion III M.D   On: 02/08/2019 13:46    Assessment/Plan:  63 y.o. male s/p RALP.  Overall doing well post-op.   - Advance to regular diet. Patient instructed to self-regulate if he develops nausea. - Medlock IVF. - Continue JP drain - will monitor output through this afternoon. - Continue multi-modal pain regimen. - Continue bowel regimen. - DVT Prophylaxis: SCDs, early ambulated (3X today) - If patient is meeting discharge goals this afternoon, plan for discharge. However, if not meeting discharge goals, will plan to keep until tomorrow.  - Patient will discharge with foley catheter in place for 7-10 days as well as ureteral stent for 6 weeks.     LOS: 0 days

## 2019-02-10 DIAGNOSIS — C61 Malignant neoplasm of prostate: Secondary | ICD-10-CM | POA: Diagnosis not present

## 2019-02-10 LAB — CBC
HCT: 36 % — ABNORMAL LOW (ref 39.0–52.0)
Hemoglobin: 12.1 g/dL — ABNORMAL LOW (ref 13.0–17.0)
MCH: 31.7 pg (ref 26.0–34.0)
MCHC: 33.6 g/dL (ref 30.0–36.0)
MCV: 94.2 fL (ref 80.0–100.0)
Platelets: 270 10*3/uL (ref 150–400)
RBC: 3.82 MIL/uL — ABNORMAL LOW (ref 4.22–5.81)
RDW: 12.4 % (ref 11.5–15.5)
WBC: 9.6 10*3/uL (ref 4.0–10.5)
nRBC: 0 % (ref 0.0–0.2)

## 2019-02-10 MED ORDER — OXYBUTYNIN CHLORIDE 5 MG PO TABS: 5.0000 mg | ORAL_TABLET | Freq: Three times a day (TID) | ORAL | 1 refills | Status: DC | PRN

## 2019-02-11 ENCOUNTER — Other Ambulatory Visit (HOSPITAL_COMMUNITY): Payer: Self-pay | Admitting: Urology

## 2019-02-11 ENCOUNTER — Ambulatory Visit (HOSPITAL_COMMUNITY)
Admission: RE | Admit: 2019-02-11 | Discharge: 2019-02-11 | Disposition: A | Discharge status: Home / Self Care | Payer: 59 | Source: Ambulatory Visit | Attending: Internal Medicine | Admitting: Internal Medicine

## 2019-02-11 ENCOUNTER — Other Ambulatory Visit: Payer: Self-pay | Admitting: Urology

## 2019-02-11 ENCOUNTER — Other Ambulatory Visit: Payer: Self-pay

## 2019-02-11 DIAGNOSIS — M7989 Other specified soft tissue disorders: Principal | ICD-10-CM

## 2019-02-11 DIAGNOSIS — M79604 Pain in right leg: Secondary | ICD-10-CM

## 2019-02-11 DIAGNOSIS — M79605 Pain in left leg: Secondary | ICD-10-CM

## 2019-02-11 NOTE — Progress Notes (Signed)
RLE venous duplex       has been completed. Preliminary results can be found under CV proc through chart review. Ayo Smoak, BS, RDMS, RVT   

## 2019-02-15 DIAGNOSIS — R1032 Left lower quadrant pain: Secondary | ICD-10-CM | POA: Diagnosis not present

## 2019-02-16 NOTE — Anesthesia Postprocedure Evaluation (Signed)
Anesthesia Post Note  Patient: Talmage Coin Kreider  Procedure(s) Performed: XI ROBOTIC ASSISTED LAPAROSCOPIC RADICAL PROSTATECTOMY (N/A ) PELVIC LYMPH NODE DISSECTION (Bilateral )     Patient location during evaluation: PACU Anesthesia Type: General Level of consciousness: awake and alert Pain management: pain level controlled Vital Signs Assessment: post-procedure vital signs reviewed and stable Respiratory status: spontaneous breathing, nonlabored ventilation, respiratory function stable and patient connected to nasal cannula oxygen Cardiovascular status: blood pressure returned to baseline and stable Postop Assessment: no apparent nausea or vomiting Anesthetic complications: no    Last Vitals:  Vitals:   02/09/19 2158 02/10/19 0332  BP: (!) 148/62 (!) 151/77  Pulse: 82 86  Resp: 20 20  Temp: 36.9 C 36.6 C  SpO2: 94% 92%    Last Pain:  Vitals:   02/10/19 0918  TempSrc:   PainSc: 3                  Akiyah Eppolito

## 2019-03-09 DIAGNOSIS — M62838 Other muscle spasm: Secondary | ICD-10-CM | POA: Diagnosis not present

## 2019-03-09 DIAGNOSIS — N393 Stress incontinence (female) (male): Secondary | ICD-10-CM | POA: Diagnosis not present

## 2019-03-09 DIAGNOSIS — M6281 Muscle weakness (generalized): Secondary | ICD-10-CM | POA: Diagnosis not present

## 2019-03-26 ENCOUNTER — Encounter: Payer: Self-pay | Admitting: Medical Oncology

## 2019-05-10 ENCOUNTER — Other Ambulatory Visit: Payer: Self-pay

## 2019-05-10 DIAGNOSIS — Z20822 Contact with and (suspected) exposure to covid-19: Secondary | ICD-10-CM

## 2019-05-11 ENCOUNTER — Encounter: Payer: Self-pay | Admitting: Medical Oncology

## 2019-05-11 LAB — NOVEL CORONAVIRUS, NAA: SARS-CoV-2, NAA: NOT DETECTED

## 2020-04-19 ENCOUNTER — Other Ambulatory Visit: Payer: Self-pay | Admitting: Internal Medicine

## 2020-04-19 ENCOUNTER — Ambulatory Visit
Admission: RE | Admit: 2020-04-19 | Discharge: 2020-04-19 | Disposition: A | Discharge status: Home / Self Care | Payer: 59 | Source: Ambulatory Visit | Attending: Internal Medicine | Admitting: Internal Medicine

## 2020-04-19 DIAGNOSIS — R059 Cough, unspecified: Secondary | ICD-10-CM

## 2020-06-08 ENCOUNTER — Other Ambulatory Visit: Payer: Self-pay | Admitting: Internal Medicine

## 2020-06-08 DIAGNOSIS — M5416 Radiculopathy, lumbar region: Secondary | ICD-10-CM

## 2020-06-28 ENCOUNTER — Other Ambulatory Visit: Payer: 59

## 2020-06-30 ENCOUNTER — Other Ambulatory Visit: Payer: 59

## 2020-08-31 ENCOUNTER — Other Ambulatory Visit: Payer: Self-pay | Admitting: Orthopedic Surgery

## 2020-09-05 NOTE — Progress Notes (Signed)
[address] [place], [facility] [address] [place] [address] Phone: [phone] Fax: [phone]      Your procedure is scheduled on Tuesday, November 30th.  Report to [facility] Main Entrance "A" at 10:00 A.M., and check in at the Admitting office.  Call this number if you have problems the morning of surgery:  [phone]  Call [phone] if you have any questions prior to your surgery date Monday-Friday 8am-4pm    Remember:  Do not eat after midnight the night before your surgery  You may drink clear liquids until 9:45 AM the morning of your surgery.   Clear liquids allowed are: Water, Non-Citrus Juices (without pulp), Carbonated Beverages, Clear Tea, Black Coffee Only, and Gatorade  Please finish the pre-surgery Ensure drink by 9:45 AM, the morning of surgery.  Drink all in one sitting.  Do not sip.  Nothing else to drink after you finish the Ensure.    Take these medicines the morning of surgery with A SIP OF WATER   Hydrocodone-Acetaminophen - if needed    As of today, STOP taking any Aspirin (unless otherwise instructed by your surgeon) Aleve, Naproxen, Ibuprofen, Motrin, Advil, Goody's, BC's, all herbal medications, fish oil, and all vitamins.                      Do not wear jewelry            Do not wear lotions, powders, colognes, or deodorant.            Men may shave face and neck.            Do not bring valuables to the hospital.            [facility] is not responsible for any belongings or valuables.  Do NOT Smoke (Tobacco/Vaping) or drink Alcohol 24 hours prior to your procedure If you use a CPAP at night, you may bring all equipment for your overnight stay.   Contacts, glasses, dentures or bridgework may not be worn into surgery.      For patients admitted to the hospital, discharge time will be determined by your treatment team.   Patients discharged the day of surgery will not be allowed to drive home, and  someone needs to stay with them for 24 hours.    Special instructions:   [facility]- Preparing For Surgery  Before surgery, you can play an important role. Because skin is not sterile, your skin needs to be as free of germs as possible. You can reduce the number of germs on your skin by washing with CHG (chlorahexidine gluconate) Soap before surgery.  CHG is an antiseptic cleaner which kills germs and bonds with the skin to continue killing germs even after washing.    Oral Hygiene is also important to reduce your risk of infection.  Remember - BRUSH YOUR TEETH THE MORNING OF SURGERY WITH YOUR REGULAR TOOTHPASTE  Please do not use if you have an allergy to CHG or antibacterial soaps. If your skin becomes reddened/irritated stop using the CHG.  Do not shave (including legs and underarms) for at least 48 hours prior to first CHG shower. It is OK to shave your face.  Please follow these instructions carefully.   1. Shower the NIGHT BEFORE SURGERY and the MORNING OF SURGERY with CHG Soap.   2. If you chose to wash your hair, wash your hair first as usual with your normal  shampoo.  3. After you shampoo, rinse your hair and body thoroughly to remove the shampoo.  4. Use CHG as you would any other liquid soap. You can apply CHG directly to the skin and wash gently with a scrungie or a clean washcloth.   5. Apply the CHG Soap to your body ONLY FROM THE NECK DOWN.  Do not use on open wounds or open sores. Avoid contact with your eyes, ears, mouth and genitals (private parts). Wash Face and genitals (private parts)  with your normal soap.   6. Wash thoroughly, paying special attention to the area where your surgery will be performed.  7. Thoroughly rinse your body with warm water from the neck down.  8. DO NOT shower/wash with your normal soap after using and rinsing off the CHG Soap.  9. Pat yourself dry with a CLEAN TOWEL.  10. Wear CLEAN PAJAMAS to bed the night before  surgery  11. Place CLEAN SHEETS on your bed the night of your first shower and DO NOT SLEEP WITH PETS.   Day of Surgery: Wear Clean/Comfortable clothing the morning of surgery Do not apply any deodorants/lotions.   Remember to brush your teeth WITH YOUR REGULAR TOOTHPASTE.   Please read over the following fact sheets that you were given.

## 2020-09-06 ENCOUNTER — Encounter (HOSPITAL_COMMUNITY): Payer: Self-pay

## 2020-09-06 ENCOUNTER — Other Ambulatory Visit: Payer: Self-pay

## 2020-09-06 ENCOUNTER — Encounter (HOSPITAL_COMMUNITY)
Admission: RE | Admit: 2020-09-06 | Discharge: 2020-09-06 | Disposition: A | Discharge status: Home / Self Care | Payer: 59 | Source: Ambulatory Visit | Attending: Orthopedic Surgery | Admitting: Orthopedic Surgery

## 2020-09-06 DIAGNOSIS — Z01812 Encounter for preprocedural laboratory examination: Secondary | ICD-10-CM | POA: Diagnosis not present

## 2020-09-06 HISTORY — DX: Chronic obstructive pulmonary disease, unspecified: J44.9

## 2020-09-06 HISTORY — DX: Myoneural disorder, unspecified: G70.9

## 2020-09-06 HISTORY — DX: Unspecified osteoarthritis, unspecified site: M19.90

## 2020-09-06 LAB — URINALYSIS, ROUTINE W REFLEX MICROSCOPIC
Bilirubin Urine: NEGATIVE
Glucose, UA: NEGATIVE mg/dL
Hgb urine dipstick: NEGATIVE
Ketones, ur: 5 mg/dL — AB
Leukocytes,Ua: NEGATIVE
Nitrite: NEGATIVE
Protein, ur: NEGATIVE mg/dL
Specific Gravity, Urine: 1.02 (ref 1.005–1.030)
pH: 6 (ref 5.0–8.0)

## 2020-09-06 LAB — SURGICAL PCR SCREEN
MRSA, PCR: NEGATIVE
Staphylococcus aureus: NEGATIVE

## 2020-09-06 LAB — PROTIME-INR
INR: 0.9 (ref 0.8–1.2)
Prothrombin Time: 12 seconds (ref 11.4–15.2)

## 2020-09-06 LAB — CBC WITH DIFFERENTIAL/PLATELET
Abs Immature Granulocytes: 0.02 10*3/uL (ref 0.00–0.07)
Basophils Absolute: 0.1 10*3/uL (ref 0.0–0.1)
Basophils Relative: 1 %
Eosinophils Absolute: 0.3 10*3/uL (ref 0.0–0.5)
Eosinophils Relative: 5 %
HCT: 46.5 % (ref 39.0–52.0)
Hemoglobin: 15.6 g/dL (ref 13.0–17.0)
Immature Granulocytes: 0 %
Lymphocytes Relative: 31 %
Lymphs Abs: 2.1 10*3/uL (ref 0.7–4.0)
MCH: 30.9 pg (ref 26.0–34.0)
MCHC: 33.5 g/dL (ref 30.0–36.0)
MCV: 92.1 fL (ref 80.0–100.0)
Monocytes Absolute: 0.7 10*3/uL (ref 0.1–1.0)
Monocytes Relative: 10 %
Neutro Abs: 3.7 10*3/uL (ref 1.7–7.7)
Neutrophils Relative %: 53 %
Platelets: 301 10*3/uL (ref 150–400)
RBC: 5.05 MIL/uL (ref 4.22–5.81)
RDW: 12.2 % (ref 11.5–15.5)
WBC: 6.8 10*3/uL (ref 4.0–10.5)
nRBC: 0 % (ref 0.0–0.2)

## 2020-09-06 LAB — COMPREHENSIVE METABOLIC PANEL
ALT: 26 U/L (ref 0–44)
AST: 24 U/L (ref 15–41)
Albumin: 4.1 g/dL (ref 3.5–5.0)
Alkaline Phosphatase: 68 U/L (ref 38–126)
Anion gap: 8 (ref 5–15)
BUN: 13 mg/dL (ref 8–23)
CO2: 29 mmol/L (ref 22–32)
Calcium: 9.4 mg/dL (ref 8.9–10.3)
Chloride: 102 mmol/L (ref 98–111)
Creatinine, Ser: 0.94 mg/dL (ref 0.61–1.24)
GFR, Estimated: >60 mL/min (ref >60)
Glucose, Bld: 108 mg/dL — ABNORMAL HIGH (ref 70–99)
Potassium: 4.4 mmol/L (ref 3.5–5.1)
Sodium: 139 mmol/L (ref 135–145)
Total Bilirubin: 0.7 mg/dL (ref 0.3–1.2)
Total Protein: 6.9 g/dL (ref 6.5–8.1)

## 2020-09-06 LAB — APTT: aPTT: 36 seconds (ref 24–36)

## 2020-09-06 NOTE — Progress Notes (Signed)
PCP - Wenda Low Cardiologist - denies  PPM/ICD - denies   Chest x-ray - 04/19/20 EKG - 09/06/2020 Stress Test - denies ECHO - denies Cardiac Cath - denies  Sleep Study - in the past, doesn't need cpap    Patient instructed to hold all Aspirin, NSAID's, herbal medications, fish oil and vitamins 7 days prior to surgery.   ERAS Protcol -yes PRE-SURGERY Ensure or G2- ensure and instructions given  COVID TEST- 09/09/2020 1150   Anesthesia review: no  Patient denies shortness of breath, fever, cough and chest pain at PAT appointment   All instructions explained to the patient, with a verbal understanding of the material. Patient agrees to go over the instructions while at home for a better understanding. Patient also instructed to self quarantine after being tested for COVID-19. The opportunity to ask questions was provided.

## 2020-09-09 ENCOUNTER — Other Ambulatory Visit (HOSPITAL_COMMUNITY)
Admission: RE | Admit: 2020-09-09 | Discharge: 2020-09-09 | Disposition: A | Discharge status: Home / Self Care | Payer: 59 | Source: Ambulatory Visit | Attending: Orthopedic Surgery | Admitting: Orthopedic Surgery

## 2020-09-09 DIAGNOSIS — Z01812 Encounter for preprocedural laboratory examination: Secondary | ICD-10-CM | POA: Insufficient documentation

## 2020-09-09 DIAGNOSIS — Z20822 Contact with and (suspected) exposure to covid-19: Secondary | ICD-10-CM | POA: Insufficient documentation

## 2020-09-10 LAB — SARS CORONAVIRUS 2 (TAT 6-24 HRS): SARS Coronavirus 2: NEGATIVE

## 2020-09-12 ENCOUNTER — Inpatient Hospital Stay (HOSPITAL_COMMUNITY)
Admission: RE | Admit: 2020-09-12 | Discharge: 2020-09-13 | DRG: 455 | Disposition: A | Discharge status: Home / Self Care | Payer: 59 | Attending: Orthopedic Surgery | Admitting: Orthopedic Surgery

## 2020-09-12 ENCOUNTER — Inpatient Hospital Stay (HOSPITAL_COMMUNITY): Payer: 59 | Admitting: Certified Registered Nurse Anesthetist

## 2020-09-12 ENCOUNTER — Other Ambulatory Visit: Payer: Self-pay

## 2020-09-12 ENCOUNTER — Encounter (HOSPITAL_COMMUNITY): Payer: Self-pay | Admitting: Orthopedic Surgery

## 2020-09-12 ENCOUNTER — Inpatient Hospital Stay (HOSPITAL_COMMUNITY)
Admission: RE | Disposition: A | Discharge status: Home / Self Care | Payer: Self-pay | Source: Home / Self Care | Attending: Orthopedic Surgery

## 2020-09-12 ENCOUNTER — Inpatient Hospital Stay (HOSPITAL_COMMUNITY): Payer: 59

## 2020-09-12 DIAGNOSIS — I10 Essential (primary) hypertension: Secondary | ICD-10-CM | POA: Diagnosis present

## 2020-09-12 DIAGNOSIS — Z20822 Contact with and (suspected) exposure to covid-19: Secondary | ICD-10-CM | POA: Diagnosis present

## 2020-09-12 DIAGNOSIS — Z419 Encounter for procedure for purposes other than remedying health state, unspecified: Secondary | ICD-10-CM

## 2020-09-12 DIAGNOSIS — F32A Depression, unspecified: Secondary | ICD-10-CM | POA: Diagnosis present

## 2020-09-12 DIAGNOSIS — M5410 Radiculopathy, site unspecified: Secondary | ICD-10-CM | POA: Diagnosis present

## 2020-09-12 DIAGNOSIS — J449 Chronic obstructive pulmonary disease, unspecified: Secondary | ICD-10-CM | POA: Diagnosis present

## 2020-09-12 DIAGNOSIS — F17210 Nicotine dependence, cigarettes, uncomplicated: Secondary | ICD-10-CM | POA: Diagnosis present

## 2020-09-12 DIAGNOSIS — Z8546 Personal history of malignant neoplasm of prostate: Secondary | ICD-10-CM | POA: Diagnosis not present

## 2020-09-12 DIAGNOSIS — M5116 Intervertebral disc disorders with radiculopathy, lumbar region: Principal | ICD-10-CM | POA: Diagnosis present

## 2020-09-12 DIAGNOSIS — Z888 Allergy status to other drugs, medicaments and biological substances status: Secondary | ICD-10-CM

## 2020-09-12 DIAGNOSIS — Z79899 Other long term (current) drug therapy: Secondary | ICD-10-CM | POA: Diagnosis not present

## 2020-09-12 DIAGNOSIS — Z9079 Acquired absence of other genital organ(s): Secondary | ICD-10-CM

## 2020-09-12 HISTORY — PX: TRANSFORAMINAL LUMBAR INTERBODY FUSION (TLIF) WITH PEDICLE SCREW FIXATION 1 LEVEL: SHX6141

## 2020-09-12 LAB — TYPE AND SCREEN
ABO/RH(D): O POS
Antibody Screen: POSITIVE

## 2020-09-12 SURGERY — TRANSFORAMINAL LUMBAR INTERBODY FUSION (TLIF) WITH PEDICLE SCREW FIXATION 1 LEVEL
Anesthesia: General | Laterality: Left

## 2020-09-12 MED ORDER — THROMBIN (RECOMBINANT) 20000 UNITS EX SOLR
CUTANEOUS | Status: AC
  Filled 2020-09-12: qty 20000

## 2020-09-12 MED ORDER — FENTANYL CITRATE (PF) 250 MCG/5ML IJ SOLN
INTRAMUSCULAR | Status: AC
  Filled 2020-09-12: qty 5

## 2020-09-12 MED ORDER — FLEET ENEMA 7-19 GM/118ML RE ENEM: 1.0000 | ENEMA | Freq: Once | RECTAL | Status: DC | PRN

## 2020-09-12 MED ORDER — HYDROMORPHONE HCL 1 MG/ML IJ SOLN
INTRAMUSCULAR | Status: AC
  Filled 2020-09-12: qty 1

## 2020-09-12 MED ORDER — HYDROMORPHONE HCL 1 MG/ML IJ SOLN
0.2500 mg | INTRAMUSCULAR | Status: DC | PRN
  Administered 2020-09-12 (×4): 0.5 mg via INTRAVENOUS

## 2020-09-12 MED ORDER — 0.9 % SODIUM CHLORIDE (POUR BTL) OPTIME
TOPICAL | Status: DC | PRN
  Administered 2020-09-12 (×2): 1000 mL

## 2020-09-12 MED ORDER — THROMBIN 20000 UNITS EX SOLR
CUTANEOUS | Status: DC | PRN
  Administered 2020-09-12: 20000 [IU] via TOPICAL

## 2020-09-12 MED ORDER — SODIUM CHLORIDE 0.9% FLUSH: 3.0000 mL | INTRAVENOUS | Status: DC | PRN

## 2020-09-12 MED ORDER — ORAL CARE MOUTH RINSE: 15.0000 mL | Freq: Once | OROMUCOSAL | Status: AC

## 2020-09-12 MED ORDER — BUPIVACAINE LIPOSOME 1.3 % IJ SUSP
INTRAMUSCULAR | Status: DC | PRN
  Administered 2020-09-12: 20 mL

## 2020-09-12 MED ORDER — ZOLPIDEM TARTRATE 5 MG PO TABS: 5.0000 mg | ORAL_TABLET | Freq: Every evening | ORAL | Status: DC | PRN

## 2020-09-12 MED ORDER — ADULT MULTIVITAMIN W/MINERALS CH: 1.0000 | ORAL_TABLET | Freq: Every day | ORAL | Status: DC

## 2020-09-12 MED ORDER — CEFAZOLIN SODIUM-DEXTROSE 2-4 GM/100ML-% IV SOLN
2.0000 g | Freq: Three times a day (TID) | INTRAVENOUS | Status: AC
  Administered 2020-09-13: 2 g via INTRAVENOUS
  Filled 2020-09-12: qty 100

## 2020-09-12 MED ORDER — SODIUM CHLORIDE 0.9 % IV SOLN: 250.0000 mL | INTRAVENOUS | Status: DC

## 2020-09-12 MED ORDER — CEFAZOLIN SODIUM-DEXTROSE 2-4 GM/100ML-% IV SOLN
INTRAVENOUS | Status: AC
  Filled 2020-09-12: qty 100

## 2020-09-12 MED ORDER — DOCUSATE SODIUM 100 MG PO CAPS
100.0000 mg | ORAL_CAPSULE | Freq: Two times a day (BID) | ORAL | Status: DC
  Administered 2020-09-13: 100 mg via ORAL
  Filled 2020-09-12 (×2): qty 1

## 2020-09-12 MED ORDER — METHOCARBAMOL 500 MG PO TABS
ORAL_TABLET | ORAL | Status: AC
  Filled 2020-09-12: qty 1

## 2020-09-12 MED ORDER — METHYLENE BLUE 0.5 % INJ SOLN
INTRAVENOUS | Status: DC | PRN
  Administered 2020-09-12: 1 mL via SUBMUCOSAL

## 2020-09-12 MED ORDER — CEFAZOLIN SODIUM-DEXTROSE 2-4 GM/100ML-% IV SOLN
2.0000 g | INTRAVENOUS | Status: AC
  Administered 2020-09-12 (×2): 2 g via INTRAVENOUS

## 2020-09-12 MED ORDER — SUGAMMADEX SODIUM 200 MG/2ML IV SOLN
INTRAVENOUS | Status: DC | PRN
  Administered 2020-09-12: 200 mg via INTRAVENOUS

## 2020-09-12 MED ORDER — PROPOFOL 10 MG/ML IV BOLUS
INTRAVENOUS | Status: AC
  Filled 2020-09-12: qty 20

## 2020-09-12 MED ORDER — CHLORHEXIDINE GLUCONATE 0.12 % MT SOLN: 15.0000 mL | Freq: Once | OROMUCOSAL | Status: AC

## 2020-09-12 MED ORDER — OXYCODONE-ACETAMINOPHEN 5-325 MG PO TABS
1.0000 | ORAL_TABLET | ORAL | Status: DC | PRN
  Administered 2020-09-12 – 2020-09-13 (×5): 2 via ORAL
  Filled 2020-09-12 (×5): qty 2

## 2020-09-12 MED ORDER — VITAMIN B-12 1000 MCG PO TABS: 500.0000 ug | ORAL_TABLET | Freq: Every day | ORAL | Status: DC

## 2020-09-12 MED ORDER — LACTATED RINGERS IV SOLN: INTRAVENOUS | Status: DC

## 2020-09-12 MED ORDER — METHYLENE BLUE 0.5 % INJ SOLN
INTRAVENOUS | Status: AC
  Filled 2020-09-12: qty 10

## 2020-09-12 MED ORDER — PHENYLEPHRINE 40 MCG/ML (10ML) SYRINGE FOR IV PUSH (FOR BLOOD PRESSURE SUPPORT)
PREFILLED_SYRINGE | INTRAVENOUS | Status: AC
  Filled 2020-09-12: qty 10

## 2020-09-12 MED ORDER — DEXAMETHASONE SODIUM PHOSPHATE 10 MG/ML IJ SOLN
INTRAMUSCULAR | Status: DC | PRN
  Administered 2020-09-12: 8 mg via INTRAVENOUS

## 2020-09-12 MED ORDER — POTASSIUM CHLORIDE IN NACL 20-0.9 MEQ/L-% IV SOLN: INTRAVENOUS | Status: DC

## 2020-09-12 MED ORDER — MIDAZOLAM HCL 2 MG/2ML IJ SOLN
INTRAMUSCULAR | Status: AC
  Filled 2020-09-12: qty 2

## 2020-09-12 MED ORDER — BUPIVACAINE-EPINEPHRINE (PF) 0.25% -1:200000 IJ SOLN
INTRAMUSCULAR | Status: AC
  Filled 2020-09-12: qty 30

## 2020-09-12 MED ORDER — MORPHINE SULFATE (PF) 2 MG/ML IV SOLN
1.0000 mg | INTRAVENOUS | Status: DC | PRN
  Administered 2020-09-12 – 2020-09-13 (×2): 2 mg via INTRAVENOUS
  Filled 2020-09-12 (×2): qty 1

## 2020-09-12 MED ORDER — METHOCARBAMOL 1000 MG/10ML IJ SOLN
500.0000 mg | Freq: Four times a day (QID) | INTRAVENOUS | Status: DC | PRN
  Filled 2020-09-12: qty 5

## 2020-09-12 MED ORDER — ROCURONIUM BROMIDE 10 MG/ML (PF) SYRINGE
PREFILLED_SYRINGE | INTRAVENOUS | Status: DC | PRN
  Administered 2020-09-12: 10 mg via INTRAVENOUS
  Administered 2020-09-12: 20 mg via INTRAVENOUS
  Administered 2020-09-12: 50 mg via INTRAVENOUS

## 2020-09-12 MED ORDER — PROPOFOL 10 MG/ML IV BOLUS
INTRAVENOUS | Status: DC | PRN
  Administered 2020-09-12: 170 mg via INTRAVENOUS

## 2020-09-12 MED ORDER — ONDANSETRON HCL 4 MG/2ML IJ SOLN: 4.0000 mg | Freq: Four times a day (QID) | INTRAMUSCULAR | Status: DC | PRN

## 2020-09-12 MED ORDER — PHENYLEPHRINE HCL-NACL 10-0.9 MG/250ML-% IV SOLN
INTRAVENOUS | Status: DC | PRN
  Administered 2020-09-12: 50 ug/min via INTRAVENOUS

## 2020-09-12 MED ORDER — ONDANSETRON HCL 4 MG/2ML IJ SOLN
INTRAMUSCULAR | Status: DC | PRN
  Administered 2020-09-12: 4 mg via INTRAVENOUS

## 2020-09-12 MED ORDER — CHLORHEXIDINE GLUCONATE 0.12 % MT SOLN
OROMUCOSAL | Status: AC
  Administered 2020-09-12: 15 mL
  Filled 2020-09-12: qty 15

## 2020-09-12 MED ORDER — POVIDONE-IODINE 7.5 % EX SOLN: Freq: Once | CUTANEOUS | Status: DC

## 2020-09-12 MED ORDER — BUPIVACAINE-EPINEPHRINE 0.25% -1:200000 IJ SOLN
INTRAMUSCULAR | Status: DC | PRN
  Administered 2020-09-12: 10 mL

## 2020-09-12 MED ORDER — METHOCARBAMOL 500 MG PO TABS
500.0000 mg | ORAL_TABLET | Freq: Four times a day (QID) | ORAL | Status: DC | PRN
  Administered 2020-09-12 – 2020-09-13 (×3): 500 mg via ORAL
  Filled 2020-09-12 (×2): qty 1

## 2020-09-12 MED ORDER — PHENOL 1.4 % MT LIQD: 1.0000 | OROMUCOSAL | Status: DC | PRN

## 2020-09-12 MED ORDER — ACETAMINOPHEN 325 MG PO TABS: 650.0000 mg | ORAL_TABLET | ORAL | Status: DC | PRN

## 2020-09-12 MED ORDER — PHENYLEPHRINE 40 MCG/ML (10ML) SYRINGE FOR IV PUSH (FOR BLOOD PRESSURE SUPPORT)
PREFILLED_SYRINGE | INTRAVENOUS | Status: DC | PRN
  Administered 2020-09-12 (×2): 200 ug via INTRAVENOUS

## 2020-09-12 MED ORDER — ACETAMINOPHEN 650 MG RE SUPP: 650.0000 mg | RECTAL | Status: DC | PRN

## 2020-09-12 MED ORDER — LIDOCAINE 2% (20 MG/ML) 5 ML SYRINGE
INTRAMUSCULAR | Status: DC | PRN
  Administered 2020-09-12: 100 mg via INTRAVENOUS

## 2020-09-12 MED ORDER — BISACODYL 5 MG PO TBEC: 5.0000 mg | DELAYED_RELEASE_TABLET | Freq: Every day | ORAL | Status: DC | PRN

## 2020-09-12 MED ORDER — ALUM & MAG HYDROXIDE-SIMETH 200-200-20 MG/5ML PO SUSP: 30.0000 mL | Freq: Four times a day (QID) | ORAL | Status: DC | PRN

## 2020-09-12 MED ORDER — ONDANSETRON HCL 4 MG PO TABS: 4.0000 mg | ORAL_TABLET | Freq: Four times a day (QID) | ORAL | Status: DC | PRN

## 2020-09-12 MED ORDER — SODIUM CHLORIDE 0.9% FLUSH: 3.0000 mL | Freq: Two times a day (BID) | INTRAVENOUS | Status: DC

## 2020-09-12 MED ORDER — FENTANYL CITRATE (PF) 250 MCG/5ML IJ SOLN
INTRAMUSCULAR | Status: DC | PRN
  Administered 2020-09-12: 50 ug via INTRAVENOUS
  Administered 2020-09-12 (×2): 100 ug via INTRAVENOUS
  Administered 2020-09-12: 50 ug via INTRAVENOUS
  Administered 2020-09-12: 100 ug via INTRAVENOUS

## 2020-09-12 MED ORDER — MENTHOL 3 MG MT LOZG: 1.0000 | LOZENGE | OROMUCOSAL | Status: DC | PRN

## 2020-09-12 MED ORDER — BUPIVACAINE LIPOSOME 1.3 % IJ SUSP
20.0000 mL | INTRAMUSCULAR | Status: AC
  Administered 2020-09-12: 20 mL
  Filled 2020-09-12: qty 20

## 2020-09-12 MED ORDER — MIDAZOLAM HCL 5 MG/5ML IJ SOLN
INTRAMUSCULAR | Status: DC | PRN
  Administered 2020-09-12: 2 mg via INTRAVENOUS

## 2020-09-12 MED ORDER — SENNOSIDES-DOCUSATE SODIUM 8.6-50 MG PO TABS: 1.0000 | ORAL_TABLET | Freq: Every evening | ORAL | Status: DC | PRN

## 2020-09-12 MED ORDER — VANCOMYCIN HCL 1000 MG IV SOLR
INTRAVENOUS | Status: AC
  Filled 2020-09-12: qty 1000

## 2020-09-12 MED ORDER — ONDANSETRON HCL 4 MG/2ML IJ SOLN
INTRAMUSCULAR | Status: AC
  Filled 2020-09-12: qty 2

## 2020-09-12 SURGICAL SUPPLY — 91 items
ADH SKN CLS LQ APL DERMABOND (GAUZE/BANDAGES/DRESSINGS) ×1
AGENT HMST KT MTR STRL THRMB (HEMOSTASIS) ×1
APL SKNCLS STERI-STRIP NONHPOA (GAUZE/BANDAGES/DRESSINGS) ×1
BENZOIN TINCTURE PRP APPL 2/3 (GAUZE/BANDAGES/DRESSINGS) ×3 IMPLANT
BLADE CLIPPER SURG (BLADE) ×2 IMPLANT
BUR PRESCISION 1.7 ELITE (BURR) ×3 IMPLANT
BUR ROUND FLUTED 5 RND (BURR) ×2 IMPLANT
BUR ROUND FLUTED 5MM RND (BURR) ×1
CAGE BULLET CONCORDE 9X9X27 (Cage) ×1 IMPLANT
CAGE BULLET CONCORDE 9X9X27MM (Cage) ×1 IMPLANT
CARTRIDGE OIL MAESTRO DRILL (MISCELLANEOUS) ×1 IMPLANT
CLOSURE STERI-STRIP 1/2X4 (GAUZE/BANDAGES/DRESSINGS) ×1
CLOSURE WOUND 1/2 X4 (GAUZE/BANDAGES/DRESSINGS) ×2
CLSR STERI-STRIP ANTIMIC 1/2X4 (GAUZE/BANDAGES/DRESSINGS) ×1 IMPLANT
CNTNR URN SCR LID CUP LEK RST (MISCELLANEOUS) ×1 IMPLANT
CONT SPEC 4OZ STRL OR WHT (MISCELLANEOUS) ×3
COVER BACK TABLE 60X90IN (DRAPES) ×3 IMPLANT
COVER MAYO STAND STRL (DRAPES) ×6 IMPLANT
COVER SURGICAL LIGHT HANDLE (MISCELLANEOUS) ×3 IMPLANT
COVER WAND RF STERILE (DRAPES) ×3 IMPLANT
DERMABOND ADHESIVE PROPEN (GAUZE/BANDAGES/DRESSINGS) ×2
DERMABOND ADVANCED .7 DNX6 (GAUZE/BANDAGES/DRESSINGS) IMPLANT
DIFFUSER DRILL AIR PNEUMATIC (MISCELLANEOUS) ×3 IMPLANT
DRAPE C-ARM 42X72 X-RAY (DRAPES) ×3 IMPLANT
DRAPE C-ARMOR (DRAPES) ×2 IMPLANT
DRAPE POUCH INSTRU U-SHP 10X18 (DRAPES) ×3 IMPLANT
DRAPE SURG 17X23 STRL (DRAPES) ×12 IMPLANT
DURAPREP 26ML APPLICATOR (WOUND CARE) ×3 IMPLANT
ELECT BLADE 4.0 EZ CLEAN MEGAD (MISCELLANEOUS) ×3
ELECT CAUTERY BLADE 6.4 (BLADE) ×3 IMPLANT
ELECT REM PT RETURN 9FT ADLT (ELECTROSURGICAL) ×3
ELECTRODE BLDE 4.0 EZ CLN MEGD (MISCELLANEOUS) ×1 IMPLANT
ELECTRODE REM PT RTRN 9FT ADLT (ELECTROSURGICAL) ×1 IMPLANT
FILTER STRAW FLUID ASPIR (MISCELLANEOUS) ×3 IMPLANT
GAUZE 4X4 16PLY RFD (DISPOSABLE) ×3 IMPLANT
GAUZE SPONGE 4X4 12PLY STRL (GAUZE/BANDAGES/DRESSINGS) ×3 IMPLANT
GLOVE BIO SURGEON STRL SZ7 (GLOVE) ×3 IMPLANT
GLOVE BIO SURGEON STRL SZ8 (GLOVE) ×3 IMPLANT
GLOVE BIOGEL PI IND STRL 7.0 (GLOVE) ×1 IMPLANT
GLOVE BIOGEL PI IND STRL 8 (GLOVE) ×1 IMPLANT
GLOVE BIOGEL PI INDICATOR 7.0 (GLOVE) ×2
GLOVE BIOGEL PI INDICATOR 8 (GLOVE) ×2
GOWN STRL REUS W/ TWL LRG LVL3 (GOWN DISPOSABLE) ×2 IMPLANT
GOWN STRL REUS W/ TWL XL LVL3 (GOWN DISPOSABLE) ×1 IMPLANT
GOWN STRL REUS W/TWL LRG LVL3 (GOWN DISPOSABLE) ×6
GOWN STRL REUS W/TWL XL LVL3 (GOWN DISPOSABLE) ×3
IV CATH 14GX2 1/4 (CATHETERS) ×3 IMPLANT
KIT BASIN OR (CUSTOM PROCEDURE TRAY) ×3 IMPLANT
KIT POSITION SURG JACKSON T1 (MISCELLANEOUS) ×3 IMPLANT
KIT TURNOVER KIT B (KITS) ×3 IMPLANT
MARKER SKIN DUAL TIP RULER LAB (MISCELLANEOUS) ×6 IMPLANT
MIX DBX 10CC 35% BONE (Bone Implant) ×2 IMPLANT
NDL 18GX1X1/2 (RX/OR ONLY) (NEEDLE) ×1 IMPLANT
NDL HYPO 25GX1X1/2 BEV (NEEDLE) ×1 IMPLANT
NDL SPNL 18GX3.5 QUINCKE PK (NEEDLE) ×2 IMPLANT
NEEDLE 18GX1X1/2 (RX/OR ONLY) (NEEDLE) ×3 IMPLANT
NEEDLE 22X1 1/2 (OR ONLY) (NEEDLE) ×6 IMPLANT
NEEDLE HYPO 25GX1X1/2 BEV (NEEDLE) ×3 IMPLANT
NEEDLE SPNL 18GX3.5 QUINCKE PK (NEEDLE) ×6 IMPLANT
NS IRRIG 1000ML POUR BTL (IV SOLUTION) ×3 IMPLANT
OIL CARTRIDGE MAESTRO DRILL (MISCELLANEOUS) ×3
PACK LAMINECTOMY ORTHO (CUSTOM PROCEDURE TRAY) ×3 IMPLANT
PACK UNIVERSAL I (CUSTOM PROCEDURE TRAY) ×3 IMPLANT
PAD ARMBOARD 7.5X6 YLW CONV (MISCELLANEOUS) ×6 IMPLANT
PATTIES SURGICAL .5 X1 (DISPOSABLE) ×3 IMPLANT
PATTIES SURGICAL .5X1.5 (GAUZE/BANDAGES/DRESSINGS) ×3 IMPLANT
ROD EXPEDIUM PRE BENT 55MM (Rod) ×4 IMPLANT
ROD PRE LORDOSED 5.5X45 (Rod) ×2 IMPLANT
SCREW SET SINGLE INNER (Screw) ×8 IMPLANT
SCREW VIPER CORT FIX 6.00X30 (Screw) ×8 IMPLANT
SPONGE INTESTINAL PEANUT (DISPOSABLE) ×3 IMPLANT
SPONGE SURGIFOAM ABS GEL 100 (HEMOSTASIS) ×3 IMPLANT
STRIP CLOSURE SKIN 1/2X4 (GAUZE/BANDAGES/DRESSINGS) ×4 IMPLANT
SURGIFLO W/THROMBIN 8M KIT (HEMOSTASIS) ×2 IMPLANT
SUT MNCRL AB 3-0 PS2 27 (SUTURE) ×2 IMPLANT
SUT VIC AB 0 CT1 18XCR BRD 8 (SUTURE) ×1 IMPLANT
SUT VIC AB 0 CT1 8-18 (SUTURE) ×3
SUT VIC AB 1 CT1 18XCR BRD 8 (SUTURE) ×1 IMPLANT
SUT VIC AB 1 CT1 8-18 (SUTURE) ×3
SUT VIC AB 2-0 CT2 18 VCP726D (SUTURE) ×3 IMPLANT
SYR 20ML LL LF (SYRINGE) ×6 IMPLANT
SYR BULB IRRIG 60ML STRL (SYRINGE) ×3 IMPLANT
SYR CONTROL 10ML LL (SYRINGE) ×6 IMPLANT
SYR TB 1ML LUER SLIP (SYRINGE) ×3 IMPLANT
TAP EXPEDIUM DL 4.35 (INSTRUMENTS) ×2 IMPLANT
TAP EXPEDIUM DL 5.0 (INSTRUMENTS) ×2 IMPLANT
TAP EXPEDIUM DL 6.0 (INSTRUMENTS) ×2 IMPLANT
TAPE CLOTH SURG 4X10 WHT LF (GAUZE/BANDAGES/DRESSINGS) ×2 IMPLANT
TRAY FOLEY MTR SLVR 16FR STAT (SET/KITS/TRAYS/PACK) ×3 IMPLANT
WATER STERILE IRR 1000ML POUR (IV SOLUTION) ×3 IMPLANT
YANKAUER SUCT BULB TIP NO VENT (SUCTIONS) ×3 IMPLANT

## 2020-09-12 NOTE — H&P (Signed)
PREOPERATIVE H&P  Chief Complaint: left leg pain  HPI: Tyrone [name] is a 64 y.o. male who presents with ongoing pain in the left leg.  The patient is now had symptoms for over approximately 4 months.  He continues to report weakness in the left leg as well.  MRI reveals a large left L1 2 disc herniation  Patient has failed multiple forms of conservative care and continues to have pain (see office notes for additional details regarding the patient's full course of treatment)  Past Medical History:  Diagnosis Date  . Arthritis    hands, knees, back  . COPD (chronic obstructive pulmonary disease) ([place])   . Depression   . Headache    migraines  . Hypertension   . Neuromuscular disorder ([place])    hands  . Prostate cancer [facility])    Past Surgical History:  Procedure Laterality Date  . APPENDECTOMY    . BACK SURGERY  1980"s   L4 to L 5 laminectomy  . COLONOSCOPY WITH PROPOFOL N/A 02/06/2016   Procedure: COLONOSCOPY WITH PROPOFOL;  Surgeon: Garlan Fair, MD;  Location: [facility] ENDOSCOPY;  Service: Endoscopy;  Laterality: N/A;  . DIAGNOSTIC LAPAROSCOPY    . LAPAROSCOPIC APPENDECTOMY N/A 08/13/2018   Procedure: APPENDECTOMY LAPAROSCOPIC;  Surgeon: Alphonsa Overall, MD;  Location: [facility] ORS;  Service: General;  Laterality: N/A;  . PELVIC LYMPH NODE DISSECTION Bilateral 02/08/2019   Procedure: PELVIC LYMPH NODE DISSECTION;  Surgeon: Lucas Mallow, MD;  Location: [facility] ORS;  Service: Urology;  Laterality: Bilateral;  . ROBOT ASSISTED LAPAROSCOPIC RADICAL PROSTATECTOMY N/A 02/08/2019   Procedure: XI ROBOTIC ASSISTED LAPAROSCOPIC RADICAL PROSTATECTOMY;  Surgeon: Lucas Mallow, MD;  Location: [facility] ORS;  Service: Urology;  Laterality: N/A;  . WRIST SURGERY Right    x 2    Social History   Socioeconomic History  . Marital status: Married    Spouse name: Not on file  . Number of children: 6  . Years of education: Not on file  . Highest education level: Not on file  Occupational  History  . Occupation: Courtyard  Tobacco Use  . Smoking status: Current Every Day Smoker    Packs/day: 2.00    Years: 45.00    Pack years: 90.00    Types: Cigarettes  . Smokeless tobacco: Never Used  Vaping Use  . Vaping Use: Never used  Substance and Sexual Activity  . Alcohol use: Yes    Comment: occasional  . Drug use: No  . Sexual activity: Not Currently  Other Topics Concern  . Not on file  Social History Narrative   Married for 79 years with 3 sons and 3 daughters. Smokes a ppd.    Social Determinants of Health   Financial Resource Strain:   . Difficulty of Paying Living Expenses: Not on file  Food Insecurity:   . Worried About Charity fundraiser in the Last Year: Not on file  . Ran Out of Food in the Last Year: Not on file  Transportation Needs:   . Lack of Transportation (Medical): Not on file  . Lack of Transportation (Non-Medical): Not on file  Physical Activity:   . Days of Exercise per Week: Not on file  . Minutes of Exercise per Session: Not on file  Stress:   . Feeling of Stress : Not on file  Social Connections:   . Frequency of Communication with Friends and Family: Not on file  . Frequency of Social Gatherings with  Friends and Family: Not on file  . Attends Religious Services: Not on file  . Active Member of Clubs or Organizations: Not on file  . Attends Archivist Meetings: Not on file  . Marital Status: Not on file   Family History  Problem Relation Age of Onset  . Colon cancer Maternal Grandmother   . Colon cancer Maternal Great-grandmother   . Prostate cancer Neg Hx    Allergies  Allergen Reactions  . Neurontin [Gabapentin] Itching    Confusion and sedation.   Prior to Admission medications   Medication Sig Start Date End Date Taking? Authorizing Provider  HYDROcodone-acetaminophen (NORCO) 5-325 MG tablet Take 1-2 tablets by mouth every 6 (six) hours as needed for moderate pain or severe pain. 02/08/19  Yes Dancy, Estill Bamberg, PA-C    Multiple Vitamin (MULTIVITAMIN WITH MINERALS) TABS tablet Take 1 tablet by mouth daily.   Yes [provider]  vitamin B-12 (CYANOCOBALAMIN) 500 MCG tablet Take 500 mcg by mouth daily.   Yes [provider]  oxybutynin (DITROPAN) 5 MG tablet Take 1 tablet (5 mg total) by mouth 3 (three) times daily as needed for bladder spasms. Stop 24 hours prior to follow-up appointment. Patient not taking: Reported on 09/01/2020 02/10/19   Dorothey Baseman, MD     All other systems have been reviewed and were otherwise negative with the exception of those mentioned in the HPI and as above.  Physical Exam: Vitals:   09/12/20 1109  BP: (!) 158/79  Pulse: 95  Resp: 20  Temp: 97.6 F (36.4 C)  SpO2: 100%    Body mass index is 28.7 kg/m.  General: Alert, no acute distress Cardiovascular: No pedal edema Respiratory: No cyanosis, no use of accessory musculature Skin: No lesions in the area of chief complaint Neurologic: Sensation intact distally Psychiatric: Patient is competent for consent with normal mood and affect Lymphatic: No axillary or cervical lymphadenopathy   Assessment/Plan: LEFT LUMBAR 2 RADICULOPATHY SECONDARY TO A LARGE EXTRUDED LUMBAR 1 - LUMBAR 2 DISC HERNIATION, MIGRATED INFERIORLY, BEHIND THE LUMBAR 2 VERTEBRAL BODY Plan for Procedure(s): LEFT-SIDED LUMBAR 1 - LUMBAR 2 TRANSFORAMINAL LUMBAR INTERBODY FUSION WITH INSTRUMENTATION AND ALLOGRAFT   Norva Karvonen, MD 09/12/2020 11:34 AM

## 2020-09-12 NOTE — Anesthesia Procedure Notes (Signed)
Procedure Name: Intubation Date/Time: 09/12/2020 12:30 PM Performed by: Wilburn Cornelia, CRNA Pre-anesthesia Checklist: Patient identified, Emergency Drugs available, Suction available, Patient being monitored and Timeout performed Patient Re-evaluated:Patient Re-evaluated prior to induction Oxygen Delivery Method: Circle system utilized Preoxygenation: Pre-oxygenation with 100% oxygen Induction Type: IV induction Ventilation: Mask ventilation with difficulty Laryngoscope Size: Mac and 4 Grade View: Grade I Tube type: Oral Tube size: 7.5 mm Number of attempts: 1 Airway Equipment and Method: Stylet Placement Confirmation: ETT inserted through vocal cords under direct vision,  positive ETCO2,  CO2 detector and breath sounds checked- equal and bilateral Secured at: 24 cm Tube secured with: Tape Dental Injury: Teeth and Oropharynx as per pre-operative assessment

## 2020-09-12 NOTE — Anesthesia Preprocedure Evaluation (Signed)
Anesthesia Evaluation  Patient identified by MRN, date of birth, ID band Patient awake    Reviewed: Allergy & Precautions, NPO status , Patient's Chart, lab work & pertinent test results  Airway Mallampati: II  TM Distance: >3 FB     Dental   Pulmonary COPD, Current Smoker,    breath sounds clear to auscultation       Cardiovascular hypertension,  Rhythm:Regular Rate:Normal     Neuro/Psych  Headaches, PSYCHIATRIC DISORDERS Depression  Neuromuscular disease    GI/Hepatic negative GI ROS, Neg liver ROS,   Endo/Other  negative endocrine ROS  Renal/GU negative Renal ROS     Musculoskeletal  (+) Arthritis ,   Abdominal   Peds  Hematology   Anesthesia Other Findings   Reproductive/Obstetrics                             Anesthesia Physical Anesthesia Plan  ASA: III  Anesthesia Plan: General   Post-op Pain Management:    Induction: Intravenous  PONV Risk Score and Plan: 2 and Ondansetron, Dexamethasone and Midazolam  Airway Management Planned: Oral ETT  Additional Equipment:   Intra-op Plan:   Post-operative Plan: Possible Post-op intubation/ventilation  Informed Consent: I have reviewed the patients History and Physical, chart, labs and discussed the procedure including the risks, benefits and alternatives for the proposed anesthesia with the patient or authorized representative who has indicated his/her understanding and acceptance.     Dental advisory given  Plan Discussed with: CRNA and Anesthesiologist  Anesthesia Plan Comments:         Anesthesia Quick Evaluation

## 2020-09-12 NOTE — Transfer of Care (Signed)
Immediate Anesthesia Transfer of Care Note  Patient: Tyrone [name]  Procedure(s) Performed: LEFT-SIDED LUMBAR 1 - LUMBAR 2 TRANSFORAMINAL LUMBAR INTERBODY FUSION WITH INSTRUMENTATION AND ALLOGRAFT (Left )  Patient Location: PACU  Anesthesia Type:General  Level of Consciousness: drowsy and patient cooperative  Airway & Oxygen Therapy: Patient Spontanous Breathing  Post-op Assessment: Report given to RN and Post -op Vital signs reviewed and stable  Post vital signs: Reviewed and stable  Last Vitals:  Vitals Value Taken Time  BP 181/81 09/12/20 1714  Temp    Pulse 97 09/12/20 1714  Resp 19 09/12/20 1714  SpO2 96 % 09/12/20 1714  Vitals shown include unvalidated device data.  Last Pain:  Vitals:   09/12/20 1125  TempSrc:   PainSc: 7       Patients Stated Pain Goal: 4 (77/11/65 7903)  Complications: No complications documented.

## 2020-09-12 NOTE — Op Note (Signed)
PATIENT NAME: Tyrone [name]   MEDICAL RECORD NO.:   [id number]   DATE OF BIRTH: 05/04/1956   DATE OF PROCEDURE: 09/12/2020                               OPERATIVE REPORT     PREOPERATIVE DIAGNOSES: 1. Left lumbar radiculopathy. 2. Large migrated left L1/2 HNP, causing severe compressing of left L2 nerve   POSTOPERATIVE DIAGNOSES: 1. Left lumbar radiculopathy. 2. Large migrated, adherent left L1/2 HNP, causing severe compressing of left L2 nerve   PROCEDURES: 1. Complex L1/2 decompression, including meticulous removal of extensive, adherent L1-2 disc material 2. Left-sided L1-2 transforaminal lumbar interbody fusion. 3. Right-sided L1-2 posterolateral fusion. 4. Insertion of interbody device x1 (1mm Concorde intervertebral spacer). 5. Placement of segmental posterior instrumentation L1, L2 bilaterally  6. Use of local autograft. 7. Use of morselized allograft - DBX-mix 8. Intraoperative use of fluoroscopy.   SURGEON:  Phylliss Bob, MD.   ASSISTANTPricilla Holm, PA-C.   ANESTHESIA:  General endotracheal anesthesia.   COMPLICATIONS:  None.   DISPOSITION:  Stable.   ESTIMATED BLOOD LOSS:  150cc   INDICATIONS FOR SURGERY:  Briefly, Tyrone [name] is a pleasant 64 -year-old male who did present to me with severe and ongoing pain in the left leg, in addition to numbness and weakness in the left leg. I did feel that the symptoms were secondary to the findings noted above.The patient failed conservative care and did wish to proceed with the procedure  noted above.   OPERATIVE DETAILS:  On 09/12/2020, the patient was brought to surgery and general endotracheal anesthesia was administered.  The patient was placed prone on a well-padded flat Jackson bed with a spinal frame.  Antibiotics were given and a time-out procedure was performed. The back was prepped and draped in the usual fashion.  A midline incision was made overlying the L1-2 intervertebral space.  The fascia was  incised at the midline.  The paraspinal musculature was bluntly swept laterally.  Anatomic landmarks for the pedicles were exposed. Using fluoroscopy, I did cannulate the L1 and L2 pedicles bilaterally, using a medial to lateral cortical trajectory technique.  At this point, 6 x 30 mm screws were placed into the right pedicles, and a 55 mm rod was placed into the tulip heads of the screw, and caps were also placed, and the caps were then provisionally tightened.  On the left side, bone wax was placed into the cannulated pedicle holes.  I then proceeded with the decompressive aspect of the procedure at the L1-2 level.  A full facetectomy was performed on the left at L1-2.  At this point, I did evaluate the traversing left L2 nerve, which was noted to be very erythematous.  I did attempt to develop a plane between the nerve and the disc material immediately ventral to it.  This portion of the procedure was extremely meticulous, as the herniated disc was extremely adherent to the traversing nerve and dura.  I was however able to safely develop a plane, however, this portion of the procedure was extremely meticulous and time-consuming, taking approximately 45 minutes, in order to develop the appropriate plane safely.  I was however able to safely medialize the traversing left L2 nerve, and abundant disc material was encountered in the lateral recess on the left side, migrated behind the L2 vertebral body.  The herniated material was removed in a very meticulous manner,  as portions of it were adherent to the dura and traversing nerve. I was very pleased with the decompression. With an assistant holding medial retraction of the traversing left L2 nerve, I did perform an annulotomy at the posterolateral aspect of the L1-2 intervertebral space.  I then used a series of curettes and pituitary rongeurs to perform a thorough and complete intervertebral diskectomy.  The intervertebral space was then liberally packed with  autograft as well as allograft in the form of DBX-mix, as was the appropriate-sized intervertebral spacer (9 mm, parallel).  The spacer was then tamped into position in the usual fashion.  I was very pleased with the press-fit of the spacer.  I then placed 6 mm screws on the left at L1 and L2. A 55-mm rod was then placed and caps were placed.  All caps were then locked.  The wound was copiously irrigated with a total of approximately 3 L prior to placing the bone graft.  Additional autograft and allograft was then packed into the posterolateral gutter on the right side to help aid in the success of the fusion.  The wound was  explored for any undue bleeding and there was no substantial bleeding encountered.  Gel-Foam was placed over the laminectomy site.  The wound was then closed in layers using #1 Vicryl followed by 2-0 Vicryl, followed by 4-0 Monocryl.  Benzoin and Steri-Strips were applied followed by sterile dressing.     Of note, Pricilla Holm was my assistant throughout surgery, and did aid in retraction, suctioning, placement of the hardware, and closure.       Phylliss Bob, MD

## 2020-09-13 ENCOUNTER — Encounter (HOSPITAL_COMMUNITY): Payer: Self-pay | Admitting: Orthopedic Surgery

## 2020-09-13 MED ORDER — CALCIUM CARBONATE ANTACID 500 MG PO CHEW
400.0000 mg | CHEWABLE_TABLET | Freq: Two times a day (BID) | ORAL | Status: DC
  Administered 2020-09-13: 400 mg via ORAL
  Filled 2020-09-13: qty 2

## 2020-09-13 MED ORDER — HYDROXYZINE HCL 50 MG/ML IM SOLN: 50.0000 mg | Freq: Four times a day (QID) | INTRAMUSCULAR | Status: DC | PRN

## 2020-09-13 MED ORDER — OXYCODONE-ACETAMINOPHEN 5-325 MG PO TABS
1.0000 | ORAL_TABLET | ORAL | 0 refills | Status: DC | PRN
Start: 2020-09-13 — End: 2021-10-17

## 2020-09-13 MED ORDER — METHOCARBAMOL 500 MG PO TABS
500.0000 mg | ORAL_TABLET | Freq: Four times a day (QID) | ORAL | 2 refills | Status: DC | PRN
Start: 2020-09-13 — End: 2021-06-22

## 2020-09-13 MED FILL — Thrombin (Recombinant) For Soln 20000 Unit: CUTANEOUS | Qty: 1 | Status: AC

## 2020-09-13 NOTE — Progress Notes (Signed)
    Patient doing well Patient denies leg pain Has been ambulating   Physical Exam: Vitals:   09/12/20 2308 09/13/20 0355  BP: (!) 150/50 (!) 144/65  Pulse: 94 89  Resp: 20 20  Temp: 98.3 F (36.8 C) 98.2 F (36.8 C)  SpO2: 96% 95%    Dressing in place NVI  POD #1 s/p L1/2 decompression and fusion, doing well  - up with PT/OT, encourage ambulation - Percocet for pain, Robaxin for muscle spasms - likely d/c home today with f/u in 2 weeks

## 2020-09-13 NOTE — Plan of Care (Signed)
Patient alert and oriented, mae's well, voiding adequate amount of urine, swallowing without difficulty, no c/o pain at time of discharge. Patient discharged home with family. Script and discharged instructions given to patient. Patient and family stated understanding of instructions given. Patient has an appointment with Dr. Dumonski 

## 2020-09-13 NOTE — Anesthesia Postprocedure Evaluation (Signed)
Anesthesia Post Note  Patient: Tyrone [name]  Procedure(s) Performed: LEFT-SIDED LUMBAR 1 - LUMBAR 2 TRANSFORAMINAL LUMBAR INTERBODY FUSION WITH INSTRUMENTATION AND ALLOGRAFT (Left )     Anesthesia Post Evaluation No complications documented.  Last Vitals:  Vitals:   09/13/20 0807 09/13/20 0923  BP: 126/71 (!) 156/74  Pulse: 80 79  Resp: 18   Temp: 36.6 C   SpO2: 98%     Last Pain:  Vitals:   09/13/20 0807  TempSrc: Oral  PainSc:                  Luree Palla S

## 2020-09-13 NOTE — Evaluation (Addendum)
Physical Therapy Evaluation and Discharge  Patient Details Name: Tyrone [name] MRN: [id number] DOB: Feb 16, 1956 Today's Date: 09/13/2020   History of Present Illness  Pt is a 64 y/o male now s/p L1-2 decompression, Left L1-2 transforaminal lumbar interbody fusion, Right L1-2 posterolateral fusion. PMHx includes COPD, depression, HTN    Clinical Impression  Patient evaluated by Physical Therapy with no further acute PT needs identified. All education has been completed and the patient has no further questions. Pt was able to demonstrate transfers and ambulation with gross supervision for safety to modified independence and no AD. Pt was educated on precautions, brace application/wearing schedule, appropriate activity progression, and car transfer. See below for any follow-up Physical Therapy or equipment needs. PT is signing off. Thank you for this referral.      Follow Up Recommendations No PT follow up;Supervision for mobility/OOB    Equipment Recommendations  None recommended by PT    Recommendations for Other Services       Precautions / Restrictions Precautions Precautions: Fall;Back Precaution Booklet Issued: Yes (comment) Precaution Comments: issued and reviewed  Required Braces or Orthoses: Spinal Brace Spinal Brace: Thoracolumbosacral orthotic;Applied in sitting position Restrictions Weight Bearing Restrictions: No      Mobility  Bed Mobility Overal bed mobility: Modified Independent Bed Mobility: Sit to Sidelying           General bed mobility comments: seated EOB upon arrival, however was able to transition to sidelying with mod I at end of session    Transfers Overall transfer level: Needs assistance Equipment used: None Transfers: Sit to/from Stand Sit to Stand: Supervision         General transfer comment: No assist required to power-up to full standing position.   Ambulation/Gait Ambulation/Gait assistance: Supervision Gait Distance (Feet): 300  Feet Assistive device: None Gait Pattern/deviations: Step-through pattern;Decreased stride length;Trunk flexed Gait velocity: Decreased Gait velocity interpretation: <1.8 ft/sec, indicate of risk for recurrent falls General Gait Details: VC's for improved posture. Pt ambulating slow but generally steady without assistance.   Stairs Stairs: Yes Stairs assistance: Supervision Stair Management: One rail Left Number of Stairs: 10 General stair comments: VC's for sequencing and general safety.   Wheelchair Mobility    Modified Rankin (Stroke Patients Only)       Balance Overall balance assessment: Mild deficits observed, not formally tested                                           Pertinent Vitals/Pain Pain Assessment: Faces Pain Score: 5  Faces Pain Scale: Hurts even more Pain Location: back, incisional Pain Descriptors / Indicators: Discomfort;Operative site guarding Pain Intervention(s): Limited activity within patient's tolerance;Monitored during session;Repositioned    Home Living Family/patient expects to be discharged to:: Private residence Living Arrangements: Spouse/significant other Available Help at Discharge: Family;Other (Comment) (pt's spouse returning to work on Saturday) Type of Home: House Home Access: Level entry     Home Layout: [place]: Bedside commode;Adaptive equipment      Prior Function Level of Independence: Independent               Hand Dominance        Extremity/Trunk Assessment   Upper Extremity Assessment Upper Extremity Assessment: Overall WFL for tasks assessed    Lower Extremity Assessment Lower Extremity Assessment: Generalized weakness (Consistent with pain and pre-op diagnosis)    Cervical /  Trunk Assessment Cervical / Trunk Assessment: Other exceptions Cervical / Trunk Exceptions: s/p spinal surgery  Communication   Communication: No difficulties  Cognition Arousal/Alertness:  Awake/alert Behavior During Therapy: WFL for tasks assessed/performed Overall Cognitive Status: Within Functional Limits for tasks assessed                                        General Comments      Exercises     Assessment/Plan    PT Assessment Patient needs continued PT services  PT Problem List Decreased strength;Decreased activity tolerance;Decreased balance;Decreased mobility;Decreased knowledge of use of DME;Decreased safety awareness;Decreased knowledge of precautions;Pain       PT Treatment Interventions DME instruction;Gait training;Stair training;Functional mobility training;Therapeutic activities;Therapeutic exercise;Neuromuscular re-education;Patient/family education    PT Goals (Current goals can be found in the Care Plan section)  Acute Rehab PT Goals Patient Stated Goal: home PT Goal Formulation: All assessment and education complete, DC therapy    Frequency Min 5X/week   Barriers to discharge        Co-evaluation               AM-PAC PT "6 Clicks" Mobility  Outcome Measure Help needed turning from your back to your side while in a flat bed without using bedrails?: None Help needed moving from lying on your back to sitting on the side of a flat bed without using bedrails?: None Help needed moving to and from a bed to a chair (including a wheelchair)?: None Help needed standing up from a chair using your arms (e.g., wheelchair or bedside chair)?: None Help needed to walk in hospital room?: None Help needed climbing 3-5 steps with a railing? : None 6 Click Score: 24    End of Session Equipment Utilized During Treatment: Gait belt;Back brace Activity Tolerance: Patient tolerated treatment well Patient left: in bed;with call bell/phone within reach;with family/visitor present Nurse Communication: Mobility status PT Visit Diagnosis: Unsteadiness on feet (R26.81);Pain Pain - part of body:  (back)    Time: 8257-4935 PT Time  Calculation (min) (ACUTE ONLY): 18 min   Charges:   PT Evaluation $PT Eval Low Complexity: 1 Low          Rolinda Roan, PT, DPT Acute Rehabilitation Services Pager: [phone] Office: [phone]   Thelma Comp 09/13/2020, 12:47 PM

## 2020-09-13 NOTE — Evaluation (Signed)
Occupational Therapy Evaluation Patient Details Name: Tyrone [name] MRN: [id number] DOB: Jan 18, 1956 Today's Date: 09/13/2020    History of Present Illness Pt is a 64 y/o male now s/p L1-2 decompression, Left L1-2 transforaminal lumbar interbody fusion, Right L1-2 posterolateral fusion. PMHx includes COPD, depression, HTN   Clinical Impression   This 64 y/o male presents with the above. PTA pt reports being independent with ADL and functional mobility. Today pt requiring up to minguard assist for functional transfers without AD, minA for brace management. Pt reports has necessary AE for completing LB ADL and is familiar with use. Further educated and reviewed with pt and pt's spouse re: back precautions, brace management, safety and compensatory strategies for completing ADL and mobility tasks during session with pt verbalizing and/or return demonstrating understanding. Pt plans to return home with spouse assist PRN for ADL/iADL tasks. Will continue to follow while pt remains in acute setting however do not anticipate he will require follow up OT services at time of discharge.     Follow Up Recommendations  No OT follow up;Supervision/Assistance - 24 hour (24hr initially)    Equipment Recommendations  None recommended by OT (pt's DME needs are met )           Precautions / Restrictions Precautions Precautions: Fall;Back Precaution Booklet Issued: Yes (comment) Precaution Comments: issued and reviewed  Required Braces or Orthoses: Spinal Brace Spinal Brace: Thoracolumbosacral orthotic;Applied in sitting position;Applied in standing position Restrictions Weight Bearing Restrictions: No      Mobility Bed Mobility               General bed mobility comments: seated EOB upon arrival     Transfers Overall transfer level: Needs assistance Equipment used: None Transfers: Sit to/from Stand Sit to Stand: Min guard;From elevated surface         General transfer comment:  elevated EOB, minguard for balance and safety but with no assist required     Balance Overall balance assessment: Mild deficits observed, not formally tested                                         ADL either performed or assessed with clinical judgement   ADL Overall ADL's : Needs assistance/impaired Eating/Feeding: Modified independent;Sitting   Grooming: Supervision/safety;Standing   Upper Body Bathing: Supervision/ safety;Sitting;Standing   Lower Body Bathing: Min guard;Sit to/from stand;With adaptive equipment   Upper Body Dressing : Minimal assistance;Standing Upper Body Dressing Details (indicate cue type and reason): for TLSO Lower Body Dressing: Min guard;Sit to/from stand;With adaptive equipment Lower Body Dressing Details (indicate cue type and reason): with use of AE- verbally reviewed techniques as pt reports already familiar  Toilet Transfer: Supervision/safety;Ambulation Toilet Transfer Details (indicate cue type and reason): simulated via transfer to/from EOB; pt has 3:1 for use over toilet  Toileting- Clothing Manipulation and Hygiene: Min guard;Sit to/from Nurse, children's Details (indicate cue type and reason): discussed use of 3:1 as shower seat if needed for added safety during task, pt verbalizing understanding/agreement  Functional mobility during ADLs: Supervision/safety                           Pertinent Vitals/Pain Pain Assessment: 0-10 Pain Score: 5  Pain Location: back, incisional Pain Descriptors / Indicators: Discomfort;Guarding;Operative site guarding Pain Intervention(s): Monitored during session;Repositioned;Limited activity within patient's tolerance  Hand Dominance     Extremity/Trunk Assessment Upper Extremity Assessment Upper Extremity Assessment: Overall WFL for tasks assessed   Lower Extremity Assessment Lower Extremity Assessment: Defer to PT evaluation   Cervical / Trunk  Assessment Cervical / Trunk Assessment: Other exceptions Cervical / Trunk Exceptions: s/p spinal surgery   Communication Communication Communication: No difficulties   Cognition Arousal/Alertness: Awake/alert Behavior During Therapy: WFL for tasks assessed/performed Overall Cognitive Status: Within Functional Limits for tasks assessed                                     General Comments       Exercises     Shoulder Instructions      Home Living Family/patient expects to be discharged to:: Private residence Living Arrangements: Spouse/significant other Available Help at Discharge: Family;Other (Comment) (pt's spouse returning to work on Saturday) Type of Home: House Home Access: Level entry     Home Layout: Two level Alternate Level Stairs-Number of Steps: flight Alternate Level Stairs-Rails: Left Bathroom Shower/Tub: Occupational psychologist: Standard     Home Equipment: Bedside commode;Adaptive equipment Adaptive Equipment: Reacher;Sock aid;Long-handled shoe horn;Long-handled sponge        Prior Functioning/Environment Level of Independence: Independent                 OT Problem List: Decreased strength;Decreased range of motion;Decreased activity tolerance;Impaired balance (sitting and/or standing);Decreased knowledge of use of DME or AE;Decreased knowledge of precautions;Pain      OT Treatment/Interventions: Self-care/ADL training;Therapeutic exercise;Energy conservation;DME and/or AE instruction;Therapeutic activities;Patient/family education;Balance training    OT Goals(Current goals can be found in the care plan section) Acute Rehab OT Goals Patient Stated Goal: home OT Goal Formulation: With patient Time For Goal Achievement: 09/27/20 Potential to Achieve Goals: Good  OT Frequency: Min 2X/week   Barriers to D/C:            Co-evaluation              AM-PAC OT "6 Clicks" Daily Activity     Outcome Measure Help  from another person eating meals?: None Help from another person taking care of personal grooming?: A Little Help from another person toileting, which includes using toliet, bedpan, or urinal?: A Little Help from another person bathing (including washing, rinsing, drying)?: A Little Help from another person to put on and taking off regular upper body clothing?: A Little Help from another person to put on and taking off regular lower body clothing?: A Little 6 Click Score: 19   End of Session Equipment Utilized During Treatment: Back brace Nurse Communication: Mobility status  Activity Tolerance: Patient tolerated treatment well Patient left: with call bell/phone within reach;with nursing/sitter in room;Other (comment) (wanted to remain standing for a few more min )  OT Visit Diagnosis: Other abnormalities of gait and mobility (R26.89);Pain Pain - part of body:  (back)                Time: 4944-9675 OT Time Calculation (min): 19 min Charges:  OT General Charges $OT Visit: 1 Visit OT Evaluation $OT Eval Low Complexity: [place], OT Acute Rehabilitation Services Pager [phone] Office [phone]   Raymondo Band 09/13/2020, 9:27 AM

## 2020-09-14 LAB — BPAM RBC
Blood Product Expiration Date: 202112122359
Blood Product Expiration Date: 202201032359
Unit Type and Rh: 5100
Unit Type and Rh: 5100

## 2020-09-14 LAB — TYPE AND SCREEN
ABO/RH(D): O POS
Antibody Screen: POSITIVE
Unit division: 0
Unit division: 0

## 2020-09-15 NOTE — Discharge Summary (Signed)
Patient ID: GILLIS BOARDLEY MRN: [id number] DOB/AGE: 1956-07-14 64 y.o.  Admit date: 09/12/2020 Discharge date: 09/13/2020  Admission Diagnoses:  Active Problems:   Radiculopathy   Discharge Diagnoses:  Same  Past Medical History:  Diagnosis Date  . Arthritis    hands, knees, back  . COPD (chronic obstructive pulmonary disease) ([place])   . Depression   . Headache    migraines  . Hypertension   . Neuromuscular disorder ([place])    hands  . Prostate cancer ([place])     Surgeries: Procedure(s): LEFT-SIDED LUMBAR 1 - LUMBAR 2 TRANSFORAMINAL LUMBAR INTERBODY FUSION WITH INSTRUMENTATION AND ALLOGRAFT on 09/12/2020   Consultants: None  Discharged Condition: Improved  Hospital Course: BYRANT VALENT is an 64 y.o. male who was admitted 09/12/2020 for operative treatment of radiculopathy. Patient has severe unremitting pain that affects sleep, daily activities, and work/hobbies. After pre-op clearance the patient was taken to the operating room on 09/12/2020 and underwent  Procedure(s): LEFT-SIDED LUMBAR 1 - LUMBAR 2 TRANSFORAMINAL LUMBAR INTERBODY FUSION WITH INSTRUMENTATION AND ALLOGRAFT.    Patient was given perioperative antibiotics:  Anti-infectives (From admission, onward)   Start     Dose/Rate Route Frequency Ordered Stop   09/13/20 0030  ceFAZolin (ANCEF) IVPB 2g/100 mL premix        2 g 200 mL/hr over 30 Minutes Intravenous Every 8 hours 09/12/20 1855 09/13/20 0226   09/12/20 1130  ceFAZolin (ANCEF) IVPB 2g/100 mL premix        2 g 200 mL/hr over 30 Minutes Intravenous On call to O.R. 09/12/20 1129 09/12/20 1630   09/12/20 1125  ceFAZolin (ANCEF) 2-4 GM/100ML-% IVPB       Note to Pharmacy: Gregery Na   : cabinet override      09/12/20 1125 09/12/20 1238       Patient was given sequential compression devices, early ambulation to prevent DVT.  Patient benefited maximally from hospital stay and there were no complications.    Recent vital signs: BP (!) 110/54  (BP Location: Left Arm)   Pulse 75   Temp 98.2 F (36.8 C) (Oral)   Resp 18   Ht 5\' 10"  (1.778 m)   Wt 90.7 kg   SpO2 98%   BMI 28.70 kg/m    Discharge Medications:   Allergies as of 09/13/2020      Reactions   Neurontin [gabapentin] Itching   Confusion and sedation.      Medication List    STOP taking these medications   HYDROcodone-acetaminophen 5-325 MG tablet Commonly known as: Norco     TAKE these medications   methocarbamol 500 MG tablet Commonly known as: ROBAXIN Take 1 tablet (500 mg total) by mouth every 6 (six) hours as needed for muscle spasms.   multivitamin with minerals Tabs tablet Take 1 tablet by mouth daily.   oxybutynin 5 MG tablet Commonly known as: DITROPAN Take 1 tablet (5 mg total) by mouth 3 (three) times daily as needed for bladder spasms. Stop 24 hours prior to follow-up appointment.   oxyCODONE-acetaminophen 5-325 MG tablet Commonly known as: PERCOCET/ROXICET Take 1-2 tablets by mouth every 4 (four) hours as needed for moderate pain or severe pain.   vitamin B-12 500 MCG tablet Commonly known as: CYANOCOBALAMIN Take 500 mcg by mouth daily.       Diagnostic Studies: DG Lumbar Spine 2-3 Views  Result Date: 09/12/2020 CLINICAL DATA:  Surgery, elective. Localization for L1-L2. Surgery, L1-L2 TLIF. Provided fluoroscopy time: 51 seconds (44.3 the  0 mGy) EXAM: LUMBAR SPINE - 2-3 VIEW; DG C-ARM 1-60 MIN COMPARISON:  Lumbar spine MRI 07/07/2020. FINDINGS: 2 lateral view intraoperative radiographs of the lumbar spine were submitted, taken at 12:45 p.m. and 1:21 p.m. On the initial radiograph, metallic instruments project posterior to the L1-L2 interspinous space and posterior to the L3 spinous process. On the subsequent radiograph taken at 6:50 p.m., a metallic instrument and an additional curvilinear opaque foreign body project posterior to the L2 level. PA and lateral view intraoperative fluoroscopic images of the lumbar spine are submitted, 3  images total. The images demonstrate a posterior spinal fusion construct at the L1-L2 level utilizing bilateral pedicle screws and vertical interconnecting rods. An L1-L2 interbody device is also present. IMPRESSION: Two lateral view intraoperative radiographs of the lumbar spine and 3 intraoperative fluoroscopic images of the lumbar spine from L1-L2 fusion, as described. Electronically Signed   By: Kellie Simmering DO   On: 09/12/2020 16:54   DG Lumbar Spine 2-3 Views  Result Date: 09/12/2020 CLINICAL DATA:  Surgery, elective. Localization for L1-L2. Surgery, L1-L2 TLIF. Provided fluoroscopy time: 51 seconds (44.3 the 0 mGy) EXAM: LUMBAR SPINE - 2-3 VIEW; DG C-ARM 1-60 MIN COMPARISON:  Lumbar spine MRI 07/07/2020. FINDINGS: 2 lateral view intraoperative radiographs of the lumbar spine were submitted, taken at 12:45 p.m. and 1:21 p.m. On the initial radiograph, metallic instruments project posterior to the L1-L2 interspinous space and posterior to the L3 spinous process. On the subsequent radiograph taken at 3:54 p.m., a metallic instrument and an additional curvilinear opaque foreign body project posterior to the L2 level. PA and lateral view intraoperative fluoroscopic images of the lumbar spine are submitted, 3 images total. The images demonstrate a posterior spinal fusion construct at the L1-L2 level utilizing bilateral pedicle screws and vertical interconnecting rods. An L1-L2 interbody device is also present. IMPRESSION: Two lateral view intraoperative radiographs of the lumbar spine and 3 intraoperative fluoroscopic images of the lumbar spine from L1-L2 fusion, as described. Electronically Signed   By: Kellie Simmering DO   On: 09/12/2020 16:54   DG C-Arm 1-60 Min  Result Date: 09/12/2020 CLINICAL DATA:  Surgery, elective. Localization for L1-L2. Surgery, L1-L2 TLIF. Provided fluoroscopy time: 51 seconds (44.3 the 0 mGy) EXAM: LUMBAR SPINE - 2-3 VIEW; DG C-ARM 1-60 MIN COMPARISON:  Lumbar spine MRI  07/07/2020. FINDINGS: 2 lateral view intraoperative radiographs of the lumbar spine were submitted, taken at 12:45 p.m. and 1:21 p.m. On the initial radiograph, metallic instruments project posterior to the L1-L2 interspinous space and posterior to the L3 spinous process. On the subsequent radiograph taken at 6:56 p.m., a metallic instrument and an additional curvilinear opaque foreign body project posterior to the L2 level. PA and lateral view intraoperative fluoroscopic images of the lumbar spine are submitted, 3 images total. The images demonstrate a posterior spinal fusion construct at the L1-L2 level utilizing bilateral pedicle screws and vertical interconnecting rods. An L1-L2 interbody device is also present. IMPRESSION: Two lateral view intraoperative radiographs of the lumbar spine and 3 intraoperative fluoroscopic images of the lumbar spine from L1-L2 fusion, as described. Electronically Signed   By: Kellie Simmering DO   On: 09/12/2020 16:54    Disposition: Discharge disposition: 01-Home or Self Care        POD #1 s/p L1/2 decompression and fusion, doing well  - up with PT/OT, encourage ambulation - Percocet for pain, Robaxin for muscle spasms -Scripts for pain sent to pharmacy electronically  -D/C instructions sheet printed and in  chart -D/C today  -F/U in office 2 weeks   Signed: Justice Britain 09/15/2020, 7:59 AM

## 2020-12-05 ENCOUNTER — Ambulatory Visit
Admission: RE | Admit: 2020-12-05 | Discharge: 2020-12-05 | Disposition: A | Discharge status: Home / Self Care | Payer: 59 | Source: Ambulatory Visit | Attending: Orthopedic Surgery | Admitting: Orthopedic Surgery

## 2020-12-05 ENCOUNTER — Other Ambulatory Visit: Payer: Self-pay | Admitting: Orthopedic Surgery

## 2020-12-05 ENCOUNTER — Other Ambulatory Visit: Payer: Self-pay

## 2020-12-05 DIAGNOSIS — M5440 Lumbago with sciatica, unspecified side: Secondary | ICD-10-CM

## 2020-12-14 ENCOUNTER — Other Ambulatory Visit: Payer: Self-pay | Admitting: Orthopedic Surgery

## 2020-12-14 DIAGNOSIS — G8929 Other chronic pain: Secondary | ICD-10-CM

## 2021-01-01 ENCOUNTER — Ambulatory Visit
Admission: RE | Admit: 2021-01-01 | Discharge: 2021-01-01 | Disposition: A | Discharge status: Home / Self Care | Payer: 59 | Source: Ambulatory Visit | Attending: Orthopedic Surgery | Admitting: Orthopedic Surgery

## 2021-01-01 ENCOUNTER — Other Ambulatory Visit: Payer: Self-pay

## 2021-01-01 DIAGNOSIS — M533 Sacrococcygeal disorders, not elsewhere classified: Secondary | ICD-10-CM

## 2021-01-01 DIAGNOSIS — G8929 Other chronic pain: Secondary | ICD-10-CM

## 2021-01-01 MED ORDER — METHYLPREDNISOLONE ACETATE 40 MG/ML INJ SUSP (RADIOLOG: 120.0000 mg | Freq: Once | INTRAMUSCULAR | Status: DC

## 2021-02-26 ENCOUNTER — Other Ambulatory Visit: Payer: Self-pay | Admitting: Orthopedic Surgery

## 2021-02-26 DIAGNOSIS — G8929 Other chronic pain: Secondary | ICD-10-CM

## 2021-02-27 ENCOUNTER — Other Ambulatory Visit: Payer: Self-pay | Admitting: Orthopedic Surgery

## 2021-02-27 DIAGNOSIS — M545 Low back pain, unspecified: Secondary | ICD-10-CM

## 2021-02-27 DIAGNOSIS — G8929 Other chronic pain: Secondary | ICD-10-CM

## 2021-02-28 ENCOUNTER — Ambulatory Visit (INDEPENDENT_AMBULATORY_CARE_PROVIDER_SITE_OTHER): Payer: 59 | Admitting: Infectious Disease

## 2021-02-28 ENCOUNTER — Other Ambulatory Visit: Payer: Self-pay

## 2021-02-28 ENCOUNTER — Encounter: Payer: Self-pay | Admitting: Infectious Disease

## 2021-02-28 VITALS — BP 131/82 | HR 116 | Temp 98.0°F | Wt 198.0 lb

## 2021-02-28 DIAGNOSIS — M5417 Radiculopathy, lumbosacral region: Secondary | ICD-10-CM | POA: Diagnosis not present

## 2021-02-28 DIAGNOSIS — A481 Legionnaires' disease: Secondary | ICD-10-CM

## 2021-02-28 DIAGNOSIS — T847XXA Infection and inflammatory reaction due to other internal orthopedic prosthetic devices, implants and grafts, initial encounter: Secondary | ICD-10-CM | POA: Diagnosis not present

## 2021-02-28 DIAGNOSIS — M4646 Discitis, unspecified, lumbar region: Secondary | ICD-10-CM

## 2021-02-28 DIAGNOSIS — M4640 Discitis, unspecified, site unspecified: Secondary | ICD-10-CM | POA: Insufficient documentation

## 2021-02-28 HISTORY — DX: Infection and inflammatory reaction due to other internal orthopedic prosthetic devices, implants and grafts, initial encounter: T84.7XXA

## 2021-02-28 HISTORY — DX: Legionnaires' disease: A48.1

## 2021-02-28 HISTORY — DX: Discitis, unspecified, site unspecified: M46.40

## 2021-02-28 NOTE — Progress Notes (Signed)
Subjective:  Reason for infectious disease consult: L1-L2 discitis vertebral osteomyelitis  Resting physician: Dr. Phylliss Bob, MD  PCP: Wenda Low, MD   Patient ID: Tyrone [name], male    DOB: 1955/11/21, 65 y.o.   MRN: [id number]  HPI  Tyrone [name] is a 64 year old Caucasian man with a past medical history significant for COPD depression, hypertension, prostate cancer who had surgery done on his spine by Dr. Lynann Bologna on September 12, 2020.  Surgery was done to address patient's left lumbar radiculopathy and a migrated L1-L2 herniated nucleus pulposis which was causing severe compression of the left L2 nerve.  He performed a complex L1-L2 decompression with removal of extensive adherent L1-L2 disc material left-sided L1-L2 transforaminal lumbar interbody fusion, right-sided L1-L2 posterior lateral fusion interbody device intervertebral spacer segment and posterior segmentation L1-L2 bilaterally allograft placement and morselized allograft.  Unfortunately December the patient developed increasingly severe low back pain.  The patient is the pain much lower down actually than the site that now has been identified on MRI is possible the being infected.  He has tried various interventions including physical therapy and injection into the sacroiliac joint in March and is still needing opiates to control pain.  He says the pain is present always but worst when he sits down or stands up.  He can lie down but he has to get a specific position in bed for the pain to abate.  Is a fairly sharp intense pain with some spasm components.  It does not radiate down his legs.  Dr. Lynann Bologna ordered an MRI which was performed on Feb 19, 2021.  The films reveal some edema that is fairly extensive in the L1 and L2 vertebral bodies with a small amount of fluid anterior to the disc base.  They felt this could Be consistent with infection but they were fairly equivocal in the read of the scan.  They mentioned an  epidural fluid collection that had really resolved in the meantime.  Dr. Lynann Bologna is endeavoring to have interventional radiology aspirate one of the vertebral bodies for culture.  Patient currently is not on antibiotics and not on blood thinners.  Hopefully the aspiration can happen soon.  I did warn the patient and his wife that frequently the aspirates of such cultures will not yield an organism that we may be stuck with empiric therapy.    Past Medical History:  Diagnosis Date  . Arthritis    hands, knees, back  . COPD (chronic obstructive pulmonary disease) ([place])   . Depression   . Diskitis 02/28/2021  . Hardware complicating wound infection ([place]) 02/28/2021  . Headache    migraines  . Hypertension   . Neuromuscular disorder ([place])    hands  . Prostate cancer [facility])     Past Surgical History:  Procedure Laterality Date  . APPENDECTOMY    . BACK SURGERY  1980"s   L4 to L 5 laminectomy  . COLONOSCOPY WITH PROPOFOL N/A 02/06/2016   Procedure: COLONOSCOPY WITH PROPOFOL;  Surgeon: Garlan Fair, MD;  Location: [facility];  Service: Endoscopy;  Laterality: N/A;  . DIAGNOSTIC LAPAROSCOPY    . LAPAROSCOPIC APPENDECTOMY N/A 08/13/2018   Procedure: APPENDECTOMY LAPAROSCOPIC;  Surgeon: Alphonsa Overall, MD;  Location: [facility];  Service: General;  Laterality: N/A;  . PELVIC LYMPH NODE DISSECTION Bilateral 02/08/2019   Procedure: PELVIC LYMPH NODE DISSECTION;  Surgeon: Lucas Mallow, MD;  Location: [facility];  Service: Urology;  Laterality: Bilateral;  . ROBOT ASSISTED LAPAROSCOPIC RADICAL  PROSTATECTOMY N/A 02/08/2019   Procedure: XI ROBOTIC ASSISTED LAPAROSCOPIC RADICAL PROSTATECTOMY;  Surgeon: Lucas Mallow, MD;  Location: [facility];  Service: Urology;  Laterality: N/A;  . TRANSFORAMINAL LUMBAR INTERBODY FUSION (TLIF) WITH PEDICLE SCREW FIXATION 1 LEVEL Left 09/12/2020   Procedure: LEFT-SIDED LUMBAR 1 - LUMBAR 2 TRANSFORAMINAL LUMBAR INTERBODY FUSION WITH INSTRUMENTATION AND ALLOGRAFT;   Surgeon: Phylliss Bob, MD;  Location: [place];  Service: Orthopedics;  Laterality: Left;  3C BED  . WRIST SURGERY Right    x 2     Family History  Problem Relation Age of Onset  . Colon cancer Maternal Grandmother   . Colon cancer Maternal Great-grandmother   . Prostate cancer Neg Hx       Social History   Socioeconomic History  . Marital status: Married    Spouse name: Not on file  . Number of children: 6  . Years of education: Not on file  . Highest education level: Not on file  Occupational History  . Occupation: Courtyard  Tobacco Use  . Smoking status: Current Every Day Smoker    Packs/day: 2.00    Years: 45.00    Pack years: 90.00    Types: Cigarettes  . Smokeless tobacco: Never Used  Vaping Use  . Vaping Use: Never used  Substance and Sexual Activity  . Alcohol use: Yes    Comment: occasional  . Drug use: No  . Sexual activity: Not Currently  Other Topics Concern  . Not on file  Social History Narrative   Married for 7 years with 3 sons and 3 daughters. Smokes a ppd.    Social Determinants of Health   Financial Resource Strain: Not on file  Food Insecurity: Not on file  Transportation Needs: Not on file  Physical Activity: Not on file  Stress: Not on file  Social Connections: Not on file    Allergies  Allergen Reactions  . Neurontin [Gabapentin] Itching    Confusion and sedation.     Current Outpatient Medications:  .  Acetaminophen-Codeine 300-30 MG tablet, , Disp: , Rfl:  .  Multiple Vitamin (MULTIVITAMIN WITH MINERALS) TABS tablet, Take 1 tablet by mouth daily., Disp: , Rfl:  .  methocarbamol (ROBAXIN) 500 MG tablet, Take 1 tablet (500 mg total) by mouth every 6 (six) hours as needed for muscle spasms. (Patient not taking: Reported on 02/28/2021), Disp: 30 tablet, Rfl: 2 .  oxybutynin (DITROPAN) 5 MG tablet, Take 1 tablet (5 mg total) by mouth 3 (three) times daily as needed for bladder spasms. Stop 24 hours prior to follow-up appointment.  (Patient not taking: No sig reported), Disp: 30 tablet, Rfl: 1 .  oxyCODONE-acetaminophen (PERCOCET/ROXICET) 5-325 MG tablet, Take 1-2 tablets by mouth every 4 (four) hours as needed for moderate pain or severe pain. (Patient not taking: Reported on 02/28/2021), Disp: 30 tablet, Rfl: 0 .  vitamin B-12 (CYANOCOBALAMIN) 500 MCG tablet, Take 500 mcg by mouth daily. (Patient not taking: Reported on 02/28/2021), Disp: , Rfl:    Review of Systems  Constitutional: Negative for activity change, appetite change, chills, diaphoresis, fatigue, fever and unexpected weight change.  HENT: Negative for congestion, rhinorrhea, sinus pressure, sneezing, sore throat and trouble swallowing.   Eyes: Negative for photophobia and visual disturbance.  Respiratory: Negative for cough, chest tightness, shortness of breath, wheezing and stridor.   Cardiovascular: Negative for chest pain, palpitations and leg swelling.  Gastrointestinal: Negative for abdominal distention, abdominal pain, anal bleeding, blood in stool, constipation, diarrhea, nausea and vomiting.  Genitourinary: Negative for difficulty urinating, dysuria, flank pain and hematuria.  Musculoskeletal: Positive for back pain. Negative for arthralgias, joint swelling and myalgias.  Skin: Negative for color change, pallor, rash and wound.  Neurological: Negative for dizziness, tremors, weakness and light-headedness.  Hematological: Negative for adenopathy. Does not bruise/bleed easily.  Psychiatric/Behavioral: Positive for dysphoric mood. Negative for agitation, behavioral problems, confusion, decreased concentration and sleep disturbance.       Objective:   Physical Exam Constitutional:      Appearance: He is well-developed.  HENT:     Head: Normocephalic and atraumatic.  Eyes:     Conjunctiva/sclera: Conjunctivae normal.  Cardiovascular:     Rate and Rhythm: Normal rate and regular rhythm.  Pulmonary:     Effort: Pulmonary effort is normal. No  respiratory distress.     Breath sounds: No wheezing.  Abdominal:     General: There is no distension.     Palpations: Abdomen is soft.  Musculoskeletal:        General: No tenderness. Normal range of motion.     Cervical back: Normal range of motion and neck supple.  Skin:    General: Skin is warm and dry.     Coloration: Skin is not pale.     Findings: No erythema or rash.  Neurological:     General: No focal deficit present.     Mental Status: He is alert and oriented to person, place, and time.  Psychiatric:        Attention and Perception: Attention and perception normal.        Mood and Affect: Mood is depressed.        Speech: Speech normal.        Behavior: Behavior is uncooperative.        Thought Content: Thought content normal.        Cognition and Memory: Cognition and memory normal.        Judgment: Judgment normal.     He is visibly in pain walking into my clinic getting up and sitting down certainly also sitting down clearly causes him significant pain.  His surgical site is well-healed.  Does not appear of any neurological deficits.       Assessment & Plan:   Possible vertebral osteomyelitis discitis L1-L2  Await IR guided biopsy for cultures  Will place PICC line  Once the aspirate of the vertebral space has been obtained for culture I would start him on empiric daptomycin given his insurance will cover it if not we will use vancomycin along with ceftriaxone and if the can tolerate it we could also add oral rifampin.  Had sed rate CRP and CBC checked with Dr. Lynann Bologna I will check a metabolic panel today as well.  I spent greater than 80 minutes with the patient including greater than 50% of time in face to face counsel of the patient and his spouse re nature of these types of infections involving bone especially if hardware is involved, nature of empiric therapy involved, and reviewing his records from Dr. Laurena Bering office his pertinent radiographs that  are visible for me to review personally, as laboratory data and in coordination of his care with radiology

## 2021-03-01 ENCOUNTER — Telehealth: Payer: Self-pay

## 2021-03-01 LAB — BASIC METABOLIC PANEL WITH GFR
BUN: 18 mg/dL (ref 7–25)
CO2: 24 mmol/L (ref 20–32)
Calcium: 10.7 mg/dL — ABNORMAL HIGH (ref 8.6–10.3)
Chloride: 99 mmol/L (ref 98–110)
Creat: 1.17 mg/dL (ref 0.70–1.25)
GFR, Est African American: 76 mL/min/{1.73_m2} (ref >=60)
GFR, Est Non African American: 65 mL/min/{1.73_m2} (ref >=60)
Glucose, Bld: 160 mg/dL — ABNORMAL HIGH (ref 65–99)
Potassium: 5 mmol/L (ref 3.5–5.3)
Sodium: 137 mmol/L (ref 135–146)

## 2021-03-01 NOTE — Telephone Encounter (Signed)
Patient is scheduled for guided biopsy on 5/20. Is scheduled for picc line 5/24. Will need first dose at short stay.  Spoke with patient who is okay with picc appt. Will fax orders to [facility] today. Requested home health call office with any PA information and copay amount.  Will fax orders to short stay once updated by [facility]. CMA will leave orders in triage to follow. Copy of orders placed in scanning.  Leatrice Jewels, RMA

## 2021-03-01 NOTE — Telephone Encounter (Signed)
Per [facility], costs associated with IV abx and care is upwards to $1600 per week. Provider ordered Dapto and rocephin.   Spoke with provider who changed order to vancomycin and rocephin. Will contact [facility] once vancomycin dosage is provided.   Raquelle Pietro Lorita Officer, RN

## 2021-03-01 NOTE — Telephone Encounter (Signed)
Per MD contacted IR to schedule appt for picc line. Will need picc placed after IR guided biopsy for cultures. Left message with Caryl Pina at IR to schedule appt for biopsy and picc line. Waiting on written orders from provider. Leatrice Jewels, RMA

## 2021-03-02 ENCOUNTER — Other Ambulatory Visit: Payer: 59

## 2021-03-02 NOTE — Telephone Encounter (Signed)
I would do 2000 mg x 1 for loading dose then 1000 mg q8h and have home health adjust accordingly.

## 2021-03-02 NOTE — Telephone Encounter (Signed)
Updated orders faxed to [facility] (Vancomycin and ceftriaxone). Orders faxed to short stay for first dose of both.   Yared Barefoot Lorita Officer, RN

## 2021-03-02 NOTE — Telephone Encounter (Addendum)
Tyrone [name] with [facility] called and stated patient's copay for IV vancomycin and ceftriaxone will be $141.83. Patient is going to discuss the copay with his wife first and give our office a call back. Patient has a Paediatric nurse, which he has not met his deductible. Tyrone [name]

## 2021-03-05 ENCOUNTER — Other Ambulatory Visit (HOSPITAL_COMMUNITY): Payer: Self-pay

## 2021-03-05 ENCOUNTER — Other Ambulatory Visit: Payer: Self-pay

## 2021-03-05 ENCOUNTER — Ambulatory Visit (HOSPITAL_COMMUNITY): Payer: 59

## 2021-03-05 ENCOUNTER — Ambulatory Visit
Admission: RE | Admit: 2021-03-05 | Discharge: 2021-03-05 | Disposition: A | Discharge status: Home / Self Care | Payer: 59 | Source: Ambulatory Visit | Attending: Orthopedic Surgery | Admitting: Orthopedic Surgery

## 2021-03-05 ENCOUNTER — Other Ambulatory Visit: Payer: Self-pay | Admitting: Infectious Disease

## 2021-03-05 VITALS — BP 122/67 | HR 109 | Temp 97.9°F | Resp 16

## 2021-03-05 DIAGNOSIS — M545 Low back pain, unspecified: Secondary | ICD-10-CM

## 2021-03-05 MED ORDER — KETOROLAC TROMETHAMINE 30 MG/ML IJ SOLN
30.0000 mg | Freq: Once | INTRAMUSCULAR | Status: AC
  Administered 2021-03-05: 30 mg via INTRAVENOUS

## 2021-03-05 MED ORDER — MIDAZOLAM HCL 2 MG/2ML IJ SOLN
1.0000 mg | INTRAMUSCULAR | Status: DC | PRN
  Administered 2021-03-05 (×2): 1 mg via INTRAVENOUS

## 2021-03-05 MED ORDER — SODIUM CHLORIDE 0.9 % IV SOLN: INTRAVENOUS | Status: DC

## 2021-03-05 MED ORDER — DOXYCYCLINE HYCLATE 100 MG PO TABS: 100.0000 mg | ORAL_TABLET | Freq: Two times a day (BID) | ORAL | 1 refills | Status: AC

## 2021-03-05 MED ORDER — FENTANYL CITRATE (PF) 100 MCG/2ML IJ SOLN
25.0000 ug | INTRAMUSCULAR | Status: DC | PRN
  Administered 2021-03-05 (×2): 50 ug via INTRAVENOUS

## 2021-03-05 NOTE — Telephone Encounter (Addendum)
I spoke to patient's wife and they are ok with paying the $71 a day for the ceftriaxone and taking the oral doxycycline. Patient scheduled for picc line appointment on 03/06/21.    I have advised Stanton Kidney with [facility] that patient will only be getting IV ceftriaxone and patient has agreed to the copay. Mary verbalized understanding.  I have also faxed a new order to [facility]  I have called Lavern to let her know patient will not need first dose of vancomycin and only first dose of ceftriaxone. I had to leave a detailed message for Lavern to let her know. Also new order for ceftriaxone faxed to short stay.  Patient will need doxycycline sent to [facility] on [address] [place]. Jemarion Roycroft T Brooks Sailors

## 2021-03-05 NOTE — Discharge Instructions (Signed)
Disc Aspiration/Bone Biopsy Post Procedure Discharge Instructions ° °1. You may resume a regular diet and any medications that you routinely take (including pain medications). °2. No driving the day of procedure. °3. Upon discharge go home and rest for at least 4 hours.  You may use an ice pack as needed to injection sites on back. °4. Remove bandaids later today. ° ° ° °Please contact our office at [phone] for the following symptoms: ° °· Fever greater than 100 degrees °· Increased swelling, pain, or redness at injection site. ° ° °Thank you for visiting [facility].  °

## 2021-03-05 NOTE — Telephone Encounter (Signed)
Rx sent 

## 2021-03-05 NOTE — Telephone Encounter (Signed)
I spoke with Debbie with [facility] and IV ceftriaxone will be $71.00  a day for the patient. I attempted to to reach out to the patient to let him know per Dr. Tommy Medal he can do doxycycline and ceftriaxone and to let him know the ceftriaxone will be $71.00 a day. Patient did not answer and a voicemail was left for patient to call our office back. Yuritzi Kamp T Brooks Sailors

## 2021-03-05 NOTE — Telephone Encounter (Signed)
Spoke with patient's wife and gave her pricing information. Per family patient will have new insurance starting new week 03/14/21 and will keep [facility] updated with any insurance changes that may affect medication pricing.  Tyrone [name]

## 2021-03-06 ENCOUNTER — Ambulatory Visit (HOSPITAL_COMMUNITY)
Admission: RE | Admit: 2021-03-06 | Discharge: 2021-03-06 | Disposition: A | Discharge status: Home / Self Care | Payer: 59 | Source: Ambulatory Visit | Attending: Infectious Disease | Admitting: Infectious Disease

## 2021-03-06 DIAGNOSIS — M4646 Discitis, unspecified, lumbar region: Secondary | ICD-10-CM | POA: Insufficient documentation

## 2021-03-06 DIAGNOSIS — T847XXA Infection and inflammatory reaction due to other internal orthopedic prosthetic devices, implants and grafts, initial encounter: Secondary | ICD-10-CM

## 2021-03-06 MED ORDER — LIDOCAINE HCL (PF) 1 % IJ SOLN
INTRAMUSCULAR | Status: DC | PRN
  Administered 2021-03-06: 5 mL

## 2021-03-06 MED ORDER — SODIUM CHLORIDE 0.9 % IV SOLN
2.0000 g | Freq: Once | INTRAVENOUS | Status: AC
  Administered 2021-03-06: 2 g via INTRAVENOUS
  Filled 2021-03-06: qty 2

## 2021-03-06 MED ORDER — LIDOCAINE HCL (PF) 1 % IJ SOLN
INTRAMUSCULAR | Status: AC
  Filled 2021-03-06: qty 30

## 2021-03-06 MED ORDER — HEPARIN SOD (PORK) LOCK FLUSH 100 UNIT/ML IV SOLN: 250.0000 [IU] | INTRAVENOUS | Status: AC | PRN

## 2021-03-06 MED ORDER — HEPARIN SOD (PORK) LOCK FLUSH 100 UNIT/ML IV SOLN
INTRAVENOUS | Status: AC
  Administered 2021-03-06: 250 [IU]
  Filled 2021-03-06: qty 5

## 2021-03-06 NOTE — Progress Notes (Signed)
Pt arrived 1620 for iv abx infusion. bp 161/80, spo2 100% p 67  t 98,

## 2021-03-06 NOTE — Procedures (Signed)
PROCEDURE SUMMARY:   Successful placement of single lumen PICC line to right brachial vein. Length 41 cm Tip at lower SVC/RA PICC capped No complications Ready for use.  EBL < 5 mL   Mala Gibbard H Jadelyn Elks PA-C 03/06/2021, 4:11 PM

## 2021-03-07 ENCOUNTER — Other Ambulatory Visit: Payer: Self-pay | Admitting: Student

## 2021-03-07 ENCOUNTER — Other Ambulatory Visit: Payer: Self-pay | Admitting: Infectious Disease

## 2021-03-07 DIAGNOSIS — M4646 Discitis, unspecified, lumbar region: Secondary | ICD-10-CM

## 2021-03-07 DIAGNOSIS — T847XXA Infection and inflammatory reaction due to other internal orthopedic prosthetic devices, implants and grafts, initial encounter: Secondary | ICD-10-CM

## 2021-03-10 LAB — SYNOVIAL FLUID ANALYSIS, COMPLETE
Basophils, %: 0 %
Eosinophils-Synovial: 0 % (ref 0–2)
Lymphocytes-Synovial Fld: 2 % (ref 0–74)
Monocyte/Macrophage: 5 % (ref 0–69)
Neutrophil, Synovial: 93 % — ABNORMAL HIGH (ref 0–24)
Synoviocytes, %: 0 % (ref 0–15)
WBC, Synovial: 1671 cells/uL — ABNORMAL HIGH (ref <150)

## 2021-03-10 LAB — ANAEROBIC AND AEROBIC CULTURE
AER RESULT:: NO GROWTH
MICRO NUMBER:: 11922064
MICRO NUMBER:: 11922065
SPECIMEN QUALITY:: ADEQUATE
SPECIMEN QUALITY:: ADEQUATE

## 2021-03-20 ENCOUNTER — Encounter: Payer: Self-pay | Admitting: Infectious Disease

## 2021-03-23 ENCOUNTER — Other Ambulatory Visit: Payer: Self-pay

## 2021-03-23 ENCOUNTER — Ambulatory Visit (INDEPENDENT_AMBULATORY_CARE_PROVIDER_SITE_OTHER): Payer: 59 | Admitting: Infectious Disease

## 2021-03-23 VITALS — Wt 200.0 lb

## 2021-03-23 DIAGNOSIS — C61 Malignant neoplasm of prostate: Secondary | ICD-10-CM | POA: Diagnosis not present

## 2021-03-23 DIAGNOSIS — T847XXD Infection and inflammatory reaction due to other internal orthopedic prosthetic devices, implants and grafts, subsequent encounter: Secondary | ICD-10-CM

## 2021-03-23 DIAGNOSIS — M5417 Radiculopathy, lumbosacral region: Secondary | ICD-10-CM

## 2021-03-23 NOTE — Progress Notes (Signed)
Subjective:     Patient ID: Tyrone [name], male    DOB: [birth year], 65 y.o.   MRN: [id number]  HPI  Tyrone [name] is a 65 year old Caucasian man with a past medical history significant for COPD depression, hypertension, prostate cancer who had surgery done on his spine by Dr. Lynann Bologna on September 12, 2020.  Surgery was done to address patient's left lumbar radiculopathy and a migrated L1-L2 herniated nucleus pulposis which was causing severe compression of the left L2 nerve.  He performed a complex L1-L2 decompression with removal of extensive adherent L1-L2 disc material left-sided L1-L2 transforaminal lumbar interbody fusion, right-sided L1-L2 posterior lateral fusion interbody device intervertebral spacer segment and posterior segmentation L1-L2 bilaterally allograft placement and morselized allograft.  Unfortunately December the patient developed increasingly severe low back pain.  The patient is the pain much lower down actually than the site that now has been identified on MRI is possible the being infected.  He has tried various interventions including physical therapy and injection into the sacroiliac joint in March and is still needing opiates to control pain.   The pain is present always but worst when he sits down or stands up.  He can lie down but he has to get a specific position in bed for the pain to abate.  Is a fairly sharp intense pain with some spasm components.  It does not radiate down his legs.  Dr. Lynann Bologna ordered an MRI which was performed on Feb 19, 2021.  The films reveal some edema that is fairly extensive in the L1 and L2 vertebral bodies with a small amount of fluid anterior to the disc base.  They felt this could Be consistent with infection but they were fairly equivocal in the read of the scan.  They mentioned an epidural fluid collection that had really resolved in the meantime.  Dr. Lynann Bologna I was able to have interventional radiology aspirate one of the vertebral  bodies for culture.  We endeavored to get him onto daptomycin and ceftriaxone but this was cost prohibitive, then we tried vancomycin and ceftriaxone but was similarly cost prohibitive ultimately ended up treating him with ceftriaxone parenterally with oral doxycycline.  He continues to take both antibiotics.  His pain is minimally improved though he is roughly 2 weeks and a few days into therapy.  He continues to be visibly quite uncomfortable and I think he clearly needs better pain management.  He previously was managed by a pain management doctor which I have now made him referral to go see again.     Past Medical History:  Diagnosis Date   Arthritis    hands, knees, back   COPD (chronic obstructive pulmonary disease) ([place])    Depression    Diskitis 01/20/8118   Hardware complicating wound infection ([place]) 02/28/2021   Headache    migraines   Hypertension    Legionella pneumonia ([place]) 02/28/2021   Neuromuscular disorder ([place])    hands   Prostate cancer [facility])     Past Surgical History:  Procedure Laterality Date   APPENDECTOMY     BACK SURGERY  1980"s   L4 to L 5 laminectomy   COLONOSCOPY WITH PROPOFOL N/A 02/06/2016   Procedure: COLONOSCOPY WITH PROPOFOL;  Surgeon: Garlan Fair, MD;  [facility];  Service: Endoscopy;  Laterality: N/A;   DIAGNOSTIC LAPAROSCOPY     LAPAROSCOPIC APPENDECTOMY N/A 08/13/2018   Procedure: APPENDECTOMY LAPAROSCOPIC;  Surgeon: Alphonsa Overall, MD;  Location: [facility];  Service: General;  Laterality:  N/A;   PELVIC LYMPH NODE DISSECTION Bilateral 02/08/2019   Procedure: PELVIC LYMPH NODE DISSECTION;  Surgeon: Lucas Mallow, MD;  [facility];  Service: Urology;  Laterality: Bilateral;   ROBOT ASSISTED LAPAROSCOPIC RADICAL PROSTATECTOMY N/A 02/08/2019   Procedure: XI ROBOTIC ASSISTED LAPAROSCOPIC RADICAL PROSTATECTOMY;  Surgeon: Lucas Mallow, MD;  [facility];  Service: Urology;  Laterality: N/A;   TRANSFORAMINAL LUMBAR  INTERBODY FUSION (TLIF) WITH PEDICLE SCREW FIXATION 1 LEVEL Left 09/12/2020   Procedure: LEFT-SIDED LUMBAR 1 - LUMBAR 2 TRANSFORAMINAL LUMBAR INTERBODY FUSION WITH INSTRUMENTATION AND ALLOGRAFT;  Surgeon: Phylliss Bob, MD;  Location: [place];  Service: Orthopedics;  Laterality: Left;  3C BED   WRIST SURGERY Right    x 2     Family History  Problem Relation Age of Onset   Colon cancer Maternal Grandmother    Colon cancer Maternal Great-grandmother    Prostate cancer Neg Hx       Social History   Socioeconomic History   Marital status: Married    Spouse name: Not on file   Number of children: 6   Years of education: Not on file   Highest education level: Not on file  Occupational History   Occupation: Courtyard  Tobacco Use   Smoking status: Every Day    Packs/day: 2.00    Years: 45.00    Pack years: 90.00    Types: Cigarettes   Smokeless tobacco: Never  Vaping Use   Vaping Use: Never used  Substance and Sexual Activity   Alcohol use: Yes    Comment: occasional   Drug use: No   Sexual activity: Not Currently  Other Topics Concern   Not on file  Social History Narrative   Married for 63 years with 3 sons and 3 daughters. Smokes a ppd.    Social Determinants of Health   Financial Resource Strain: Not on file  Food Insecurity: Not on file  Transportation Needs: Not on file  Physical Activity: Not on file  Stress: Not on file  Social Connections: Not on file    Allergies  Allergen Reactions   Neurontin [Gabapentin] Itching and Other (See Comments)    Confusion and sedation.     Current Outpatient Medications:    doxycycline (VIBRA-TABS) 100 MG tablet, Take 1 tablet (100 mg total) by mouth 2 (two) times daily., Disp: 84 tablet, Rfl: 1   oxyCODONE-acetaminophen (PERCOCET/ROXICET) 5-325 MG tablet, Take 1-2 tablets by mouth every 4 (four) hours as needed for moderate pain or severe pain., Disp: 30 tablet, Rfl: 0   Acetaminophen-Codeine 300-30 MG tablet, , Disp: ,  Rfl:    methocarbamol (ROBAXIN) 500 MG tablet, Take 1 tablet (500 mg total) by mouth every 6 (six) hours as needed for muscle spasms. (Patient not taking: Reported on 02/28/2021), Disp: 30 tablet, Rfl: 2   Multiple Vitamin (MULTIVITAMIN WITH MINERALS) TABS tablet, Take 1 tablet by mouth daily., Disp: , Rfl:    oxybutynin (DITROPAN) 5 MG tablet, Take 1 tablet (5 mg total) by mouth 3 (three) times daily as needed for bladder spasms. Stop 24 hours prior to follow-up appointment. (Patient not taking: No sig reported), Disp: 30 tablet, Rfl: 1   vitamin B-12 (CYANOCOBALAMIN) 500 MCG tablet, Take 500 mcg by mouth daily. (Patient not taking: Reported on 02/28/2021), Disp: , Rfl:    Review of Systems  Constitutional:  Negative for activity change, appetite change, chills, diaphoresis, fatigue, fever and unexpected weight change.  HENT:  Negative for congestion,  rhinorrhea, sinus pressure, sneezing, sore throat and trouble swallowing.   Eyes:  Negative for photophobia and visual disturbance.  Respiratory:  Negative for cough, chest tightness, shortness of breath, wheezing and stridor.   Cardiovascular:  Negative for chest pain, palpitations and leg swelling.  Gastrointestinal:  Negative for abdominal distention, abdominal pain, anal bleeding, blood in stool, constipation, diarrhea, nausea and vomiting.  Genitourinary:  Negative for difficulty urinating, dysuria, flank pain and hematuria.  Musculoskeletal:  Positive for back pain. Negative for arthralgias, joint swelling and myalgias.  Skin:  Negative for color change, pallor, rash and wound.  Neurological:  Negative for dizziness, tremors, weakness and light-headedness.  Hematological:  Negative for adenopathy. Does not bruise/bleed easily.  Psychiatric/Behavioral:  Positive for dysphoric mood. Negative for agitation, behavioral problems, confusion, decreased concentration and sleep disturbance.       Objective:   Physical Exam Constitutional:       Appearance: He is well-developed.  HENT:     Head: Normocephalic and atraumatic.  Eyes:     Conjunctiva/sclera: Conjunctivae normal.  Cardiovascular:     Rate and Rhythm: Normal rate and regular rhythm.  Pulmonary:     Effort: Pulmonary effort is normal. No respiratory distress.     Breath sounds: No wheezing.  Abdominal:     General: There is no distension.     Palpations: Abdomen is soft.  Musculoskeletal:        General: No tenderness. Normal range of motion.     Cervical back: Normal range of motion and neck supple.  Skin:    General: Skin is warm and dry.     Coloration: Skin is not pale.     Findings: No erythema or rash.  Neurological:     General: No focal deficit present.     Mental Status: He is alert and oriented to person, place, and time.  Psychiatric:        Attention and Perception: Attention and perception normal.        Mood and Affect: Mood and affect normal. Mood is not depressed.        Speech: Speech normal.        Thought Content: Thought content normal.        Cognition and Memory: Cognition and memory normal.        Judgment: Judgment normal.    He is visibly in pain walking into my clinic getting up and sitting down certainly also sitting down clearly causes him significant pain.  His surgical site is well-healed.  Does not appear of any neurological deficits.       Assessment & Plan:   Possible vertebral osteomyelitis discitis L1-L2  We will continue current antibiotics and extend course of antibiotics through 7 July while reevaluate him in clinic.  There will then continue him on oral antibiotics likely doxycycline and cefdinir.  Will push towards 6 months but more likely a year of therapy.  May need to reimage him at some point certainly if the pain worsens.  Pain control I feel his pain is clearly not well controlled it never was from the moment I have met him.  I think he needs stronger or higher dose opiates to get the pain under  control.  Certainly this can potentially confound our ability to understand how his infection is doing but can also monitor how well or how much pain meds that he is needing.  Clearly he is still quite uncomfortable just simply walking around our clinic or standing from  a sitting posit   I spent more than 40 minutes with the patient including greater than 50% of time in face to face counseling of the patient personally reviewing radiographs, along with pertinent laboratory microbiological data review of medical records and in coordination of his care.

## 2021-04-19 ENCOUNTER — Ambulatory Visit: Payer: 59 | Admitting: Infectious Disease

## 2021-04-19 ENCOUNTER — Telehealth: Payer: Self-pay

## 2021-04-19 ENCOUNTER — Other Ambulatory Visit: Payer: Self-pay

## 2021-04-19 ENCOUNTER — Encounter: Payer: Self-pay | Admitting: Infectious Disease

## 2021-04-19 VITALS — BP 133/77 | HR 99 | Wt 201.0 lb

## 2021-04-19 DIAGNOSIS — T847XXD Infection and inflammatory reaction due to other internal orthopedic prosthetic devices, implants and grafts, subsequent encounter: Secondary | ICD-10-CM

## 2021-04-19 DIAGNOSIS — M5417 Radiculopathy, lumbosacral region: Secondary | ICD-10-CM | POA: Diagnosis not present

## 2021-04-19 DIAGNOSIS — M4646 Discitis, unspecified, lumbar region: Secondary | ICD-10-CM | POA: Diagnosis not present

## 2021-04-19 MED ORDER — DOXYCYCLINE HYCLATE 100 MG PO TABS: 100.0000 mg | ORAL_TABLET | Freq: Two times a day (BID) | ORAL | 11 refills | Status: DC

## 2021-04-19 MED ORDER — CEFDINIR 300 MG PO CAPS: 300.0000 mg | ORAL_CAPSULE | Freq: Two times a day (BID) | ORAL | 11 refills | Status: DC

## 2021-04-19 NOTE — Progress Notes (Signed)
Per verbal order from Dr Tommy Medal,  41 cm Single Lumen Peripherally Inserted Central Catheter removed from right basilic, tip intact. No sutures present. RN confirmed length per chart. Dressing was clean and dry. Petroleum dressing applied. Pt advised no heavy lifting with this arm, leave dressing for 24 hours and call the office or seek emergent care if dressing becomes soaked with blood or swelling or sharp pain presents. Patient verbalized understanding and agreement.  Patient's questions answered to their satisfaction. Patient tolerated procedure well, RN walked patient to check out. Pharmacy notified.  Bevely Hackbart Lorita Officer, RN

## 2021-04-19 NOTE — Telephone Encounter (Signed)
PICC pulled in clinic today; [facility] notified. Beginning oral abx.  Vivian Okelley Lorita Officer, RN

## 2021-04-19 NOTE — Progress Notes (Signed)
Subjective:     Patient ID: Tyrone [name], male    DOB: [birth year], 65 y.o.   MRN: [id number]  HPI  Tyrone [name] is a 65 year old Caucasian man with a past medical history significant for COPD depression, hypertension, prostate cancer who had surgery done on his spine by Dr. Lynann Bologna on September 12, 2020.  Surgery was done to address patient's left lumbar radiculopathy and a migrated L1-L2 herniated nucleus pulposis which was causing severe compression of the left L2 nerve.  He performed a complex L1-L2 decompression with removal of extensive adherent L1-L2 disc material left-sided L1-L2 transforaminal lumbar interbody fusion, right-sided L1-L2 posterior lateral fusion interbody device intervertebral spacer segment and posterior segmentation L1-L2 bilaterally allograft placement and morselized allograft.  Unfortunately December the patient developed increasingly severe low back pain.  The patient is the pain much lower down actually than the site that now has been identified on MRI is possible the being infected.  He has tried various interventions including physical therapy and injection into the sacroiliac joint in March and is still needing opiates to control pain.   The pain is present always but worst when he sits down or stands up.  He can lie down but he has to get a specific position in bed for the pain to abate.  Is a fairly sharp intense pain with some spasm components.  It does not radiate down his legs.  Dr. Lynann Bologna ordered an MRI which was performed on Feb 19, 2021.  The films reveal some edema that is fairly extensive in the L1 and L2 vertebral bodies with a small amount of fluid anterior to the disc base.  They felt this could Be consistent with infection but they were fairly equivocal in the read of the scan.  They mentioned an epidural fluid collection that had really resolved in the meantime.  Dr. Lynann Bologna I was able to have interventional radiology aspirate one of the vertebral  bodies for culture.  We endeavored to get him onto daptomycin and ceftriaxone but this was cost prohibitive, then we tried vancomycin and ceftriaxone but was similarly cost prohibitive ultimately ended up treating him with ceftriaxone parenterally with oral doxycycline.  He continues to take both antibiotics.    His pain was minimally improved a few days to initial week into therapy.  However his pain was never really optimally controlled and I recommended him be seen by a pain specialist.  In the interval he has done so and he is visibly much more comfortable.  Previously when he walked into my clinic he was walking very slowly and even the slightest movement of bending to sit up or stand up generate severe pain.  He did not always verbalize the pain but it was physically obvious to see.  I was quite happy to see his pain under much better control today.  He still has pain but least now we have a better baseline from which to observe him without him suffering unnecessarily.  He actually inquired about ability to return to work which I am supportive of if its not going to worsen his pain.  He says he will not be carrying heavy things but he may have to bend over at times.  I have recommended that he check with Dr. Lynann Bologna and or physical therapy for evaluation of things he should and should not do at work.  From an antibiotic standpoint I do not have an issue with him being at work as the [employer] is out.  Past Medical History:  Diagnosis Date   Arthritis    hands, knees, back   COPD (chronic obstructive pulmonary disease) ([place])    Depression    Diskitis 01/20/8118   Hardware complicating wound infection ([place]) 02/28/2021   Headache    migraines   Hypertension    Legionella pneumonia ([place]) 02/28/2021   Neuromuscular disorder ([place])    hands   Prostate cancer [facility])     Past Surgical History:  Procedure Laterality Date   APPENDECTOMY     BACK SURGERY  1980"s   L4 to L 5  laminectomy   COLONOSCOPY WITH PROPOFOL N/A 02/06/2016   Procedure: COLONOSCOPY WITH PROPOFOL;  Surgeon: Garlan Fair, MD;  [facility];  Service: Endoscopy;  Laterality: N/A;   DIAGNOSTIC LAPAROSCOPY     LAPAROSCOPIC APPENDECTOMY N/A 08/13/2018   Procedure: APPENDECTOMY LAPAROSCOPIC;  Surgeon: Alphonsa Overall, MD;  [facility];  Service: General;  Laterality: N/A;   PELVIC LYMPH NODE DISSECTION Bilateral 02/08/2019   Procedure: PELVIC LYMPH NODE DISSECTION;  Surgeon: Lucas Mallow, MD;  [facility];  Service: Urology;  Laterality: Bilateral;   ROBOT ASSISTED LAPAROSCOPIC RADICAL PROSTATECTOMY N/A 02/08/2019   Procedure: XI ROBOTIC ASSISTED LAPAROSCOPIC RADICAL PROSTATECTOMY;  Surgeon: Lucas Mallow, MD;  [facility];  Service: Urology;  Laterality: N/A;   TRANSFORAMINAL LUMBAR INTERBODY FUSION (TLIF) WITH PEDICLE SCREW FIXATION 1 LEVEL Left 09/12/2020   Procedure: LEFT-SIDED LUMBAR 1 - LUMBAR 2 TRANSFORAMINAL LUMBAR INTERBODY FUSION WITH INSTRUMENTATION AND ALLOGRAFT;  Surgeon: Phylliss Bob, MD;  Location: [facility];  Service: Orthopedics;  Laterality: Left;  3C BED   WRIST SURGERY Right    x 2     Family History  Problem Relation Age of Onset   Colon cancer Maternal Grandmother    Colon cancer Maternal Great-grandmother    Prostate cancer Neg Hx       Social History   Socioeconomic History   Marital status: Married    Spouse name: Not on file   Number of children: 6   Years of education: Not on file   Highest education level: Not on file  Occupational History   Occupation: Courty[employer]  Tobacco Use   Smoking status: Every Day    Packs/day: 2.00    Years: 45.00    Pack years: 90.00    Types: Cigarettes   Smokeless tobacco: Never  Vaping Use   Vaping Use: Never used  Substance and Sexual Activity   Alcohol use: Yes    Comment: occasional   Drug use: No   Sexual activity: Not Currently  Other Topics Concern   Not on file  Social History  Narrative   Married for 65 years with 3 sons and 3 daughters. Smokes a ppd.    Social Determinants of Health   Financial Resource Strain: Not on file  Food Insecurity: Not on file  Transportation Needs: Not on file  Physical Activity: Not on file  Stress: Not on file  Social Connections: Not on file    Allergies  Allergen Reactions   Neurontin [Gabapentin] Itching and Other (See Comments)    Confusion and sedation.     Current Outpatient Medications:    Acetaminophen-Codeine 300-30 MG tablet, , Disp: , Rfl:    CELEBREX 200 MG capsule, Take 200 mg by mouth 2 (two) times daily., Disp: , Rfl:    HYDROcodone-acetaminophen (NORCO/VICODIN) 5-325 MG tablet, Take by mouth., Disp: , Rfl:    MEDROL 4 MG TBPK tablet, Take  by mouth., Disp: , Rfl:    methocarbamol (ROBAXIN) 500 MG tablet, Take 1 tablet (500 mg total) by mouth every 6 (six) hours as needed for muscle spasms. (Patient not taking: Reported on 02/28/2021), Disp: 30 tablet, Rfl: 2   morphine (MSIR) 15 MG tablet, Take 15 mg by mouth 4 (four) times daily as needed., Disp: , Rfl:    Multiple Vitamin (MULTIVITAMIN WITH MINERALS) TABS tablet, Take 1 tablet by mouth daily., Disp: , Rfl:    oxybutynin (DITROPAN) 5 MG tablet, Take 1 tablet (5 mg total) by mouth 3 (three) times daily as needed for bladder spasms. Stop 24 hours prior to follow-up appointment. (Patient not taking: No sig reported), Disp: 30 tablet, Rfl: 1   oxyCODONE-acetaminophen (PERCOCET/ROXICET) 5-325 MG tablet, Take 1-2 tablets by mouth every 4 (four) hours as needed for moderate pain or severe pain., Disp: 30 tablet, Rfl: 0   sildenafil (VIAGRA) 100 MG tablet, TAKE ONE TABLET BY MOUTH AS NEEDED *DO NOT TAKE MEDICATION WITH YOUR INJECTIONS FOR ERECTILE DYSFUNCTION AT THE SAME TIME*, Disp: , Rfl:    tiZANidine (ZANAFLEX) 4 MG tablet, Take 4 mg by mouth at bedtime., Disp: , Rfl:    VALIUM 5 MG tablet, Take by mouth., Disp: , Rfl:    vitamin B-12 (CYANOCOBALAMIN) 500 MCG  tablet, Take 500 mcg by mouth daily. (Patient not taking: Reported on 02/28/2021), Disp: , Rfl:    Review of Systems  Constitutional:  Negative for activity change, appetite change, chills, diaphoresis, fatigue, fever and unexpected weight change.  HENT:  Negative for congestion, rhinorrhea, sinus pressure, sneezing, sore throat and trouble swallowing.   Eyes:  Negative for photophobia and visual disturbance.  Respiratory:  Negative for cough, chest tightness, shortness of breath, wheezing and stridor.   Cardiovascular:  Negative for chest pain, palpitations and leg swelling.  Gastrointestinal:  Negative for abdominal distention, abdominal pain, anal bleeding, blood in stool, constipation, diarrhea, nausea and vomiting.  Genitourinary:  Negative for difficulty urinating, dysuria, flank pain and hematuria.  Musculoskeletal:  Positive for back pain. Negative for arthralgias, joint swelling and myalgias.  Skin:  Negative for color change, pallor, rash and wound.  Neurological:  Negative for dizziness, tremors, weakness and light-headedness.  Hematological:  Negative for adenopathy. Does not bruise/bleed easily.  Psychiatric/Behavioral:  Negative for agitation, behavioral problems, confusion, decreased concentration, dysphoric mood and sleep disturbance.       Objective:   Physical Exam Constitutional:      Appearance: He is well-developed.  HENT:     Head: Normocephalic and atraumatic.  Eyes:     Conjunctiva/sclera: Conjunctivae normal.  Cardiovascular:     Rate and Rhythm: Normal rate and regular rhythm.  Pulmonary:     Effort: Pulmonary effort is normal. No respiratory distress.     Breath sounds: No wheezing.  Abdominal:     General: There is no distension.     Palpations: Abdomen is soft.  Musculoskeletal:        General: No tenderness. Normal range of motion.     Cervical back: Normal range of motion and neck supple.  Skin:    General: Skin is warm and dry.     Coloration:  Skin is not pale.     Findings: No erythema or rash.  Neurological:     General: No focal deficit present.     Mental Status: He is alert and oriented to person, place, and time.  Psychiatric:        Attention and Perception: Attention  and perception normal.        Mood and Affect: Mood and affect normal. Mood is not depressed.        Speech: Speech normal.        Behavior: Behavior normal.        Thought Content: Thought content normal.        Cognition and Memory: Cognition and memory normal.        Judgment: Judgment normal.    He is visibly in pain walking into my clinic getting up and sitting down certainly also sitting down clearly causes him significant pain.  His surgical site is well-healed.  Does not appear of any neurological deficits.       Assessment & Plan:   Possible vertebral osteomyelitis discitis L1-L2  He has completed a nice course of therapy.  We will discontinue his PICC line.  We will continue doxycycline along with oral cefdinir and reevaluate him in 2 months time.   Pain control much better managed now that he has been seeing his pain specialist.

## 2021-06-22 ENCOUNTER — Ambulatory Visit: Payer: 59 | Admitting: Infectious Disease

## 2021-06-22 ENCOUNTER — Other Ambulatory Visit: Payer: Self-pay

## 2021-06-22 VITALS — BP 146/81 | HR 76 | Resp 16 | Ht 70.0 in | Wt 202.0 lb

## 2021-06-22 DIAGNOSIS — M4646 Discitis, unspecified, lumbar region: Secondary | ICD-10-CM

## 2021-06-22 DIAGNOSIS — T847XXD Infection and inflammatory reaction due to other internal orthopedic prosthetic devices, implants and grafts, subsequent encounter: Secondary | ICD-10-CM

## 2021-06-22 NOTE — Progress Notes (Signed)
Subjective:     Patient ID: Tyrone [name], male    DOB: 1956-01-10, 65 y.o.   MRN: [id number]  HPI  Tyrone [name] is a 65 year old Caucasian man with a past medical history significant for COPD depression, hypertension, prostate cancer who had surgery done on his spine by Dr. Lynann Bologna on September 12, 2020.  Surgery was done to address patient's left lumbar radiculopathy and a migrated L1-L2 herniated nucleus pulposis which was causing severe compression of the left L2 nerve.  He performed a complex L1-L2 decompression with removal of extensive adherent L1-L2 disc material left-sided L1-L2 transforaminal lumbar interbody fusion, right-sided L1-L2 posterior lateral fusion interbody device intervertebral spacer segment and posterior segmentation L1-L2 bilaterally allograft placement and morselized allograft.  Unfortunately December the patient developed increasingly severe low back pain.  The patient is the pain much lower down actually than the site that now has been identified on MRI is possible the being infected.  He has tried various interventions including physical therapy and injection into the sacroiliac joint in March and is still needing opiates to control pain.   The pain is present always but worst when he sits down or stands up.  He can lie down but he has to get a specific position in bed for the pain to abate.  Is a fairly sharp intense pain with some spasm components.  It does not radiate down his legs.  Dr. Lynann Bologna ordered an MRI which was performed on Feb 19, 2021.  The films reveal some edema that is fairly extensive in the L1 and L2 vertebral bodies with a small amount of fluid anterior to the disc base.  They felt this could Be consistent with infection but they were fairly equivocal in the read of the scan.  They mentioned an epidural fluid collection that had really resolved in the meantime.  Dr. Lynann Bologna I was able to have interventional radiology aspirate one of the vertebral  bodies for culture.  We endeavored to get him onto daptomycin and ceftriaxone but this was cost prohibitive, then we tried vancomycin and ceftriaxone but was similarly cost prohibitive ultimately ended up treating him with ceftriaxone parenterally with oral doxycycline.  He continues to take both antibiotics.    His pain was minimally improved a few days to initial week into therapy.  However his pain was never really optimally controlled and I had recommended him be seen by a pain specialist.  In the interval he has done so and he is visibly much more comfortable.  Previously when he walked into my clinic he was walking very slowly and even the slightest movement of bending to sit up or stand up generate severe pain.  He did not always verbalize the pain but it was physically obvious to see in the past.  I was quite happy to see his pain under much better control t when I saw him last  He completed IV antibiotics and was placed him on cefdinir and doxycycline.  He is continue to have improvement in his pain and is returned to work.  He is visibly more comfortable today moving around in his chair rather than sitting in a very stiff position as he did in the past.  He has seen Dr. Lynann Bologna who has confirmed that his spine is now fused Dr. Lynann Bologna has released him from needing to follow-up with him.  He says he still has more pain in particular at work but it is nothing like it was before.  He is  tolerating antibiotics without difficulty he did notice some sensation of burning in his arms when his they were exposed to sun which certainly could be due to doxycycline and sun exposure.       Past Medical History:  Diagnosis Date   Arthritis    hands, knees, back   COPD (chronic obstructive pulmonary disease) ([place])    Depression    Diskitis 0000000   Hardware complicating wound infection ([place]) 02/28/2021   Headache    migraines   Hypertension    Legionella pneumonia ([place])  02/28/2021   Neuromuscular disorder ([place])    hands   Prostate cancer [facility])     Past Surgical History:  Procedure Laterality Date   APPENDECTOMY     BACK SURGERY  1980"s   L4 to L 5 laminectomy   COLONOSCOPY WITH PROPOFOL N/A 02/06/2016   Procedure: COLONOSCOPY WITH PROPOFOL;  Surgeon: Garlan Fair, MD;  [facility]: [facility];  Service: Endoscopy;  Laterality: N/A;   DIAGNOSTIC LAPAROSCOPY     LAPAROSCOPIC APPENDECTOMY N/A 08/13/2018   Procedure: APPENDECTOMY LAPAROSCOPIC;  Surgeon: Alphonsa Overall, MD;  Location: [facility];  Service: General;  Laterality: N/A;   PELVIC LYMPH NODE DISSECTION Bilateral 02/08/2019   Procedure: PELVIC LYMPH NODE DISSECTION;  Surgeon: Lucas Mallow, MD;  Location: [facility];  Service: Urology;  Laterality: Bilateral;   ROBOT ASSISTED LAPAROSCOPIC RADICAL PROSTATECTOMY N/A 02/08/2019   Procedure: XI ROBOTIC ASSISTED LAPAROSCOPIC RADICAL PROSTATECTOMY;  Surgeon: Lucas Mallow, MD;  [facility]: [facility];  Service: Urology;  Laterality: N/A;   TRANSFORAMINAL LUMBAR INTERBODY FUSION (TLIF) WITH PEDICLE SCREW FIXATION 1 LEVEL Left 09/12/2020   Procedure: LEFT-SIDED LUMBAR 1 - LUMBAR 2 TRANSFORAMINAL LUMBAR INTERBODY FUSION WITH INSTRUMENTATION AND ALLOGRAFT;  Surgeon: Phylliss Bob, MD;  Location: [place];  Service: Orthopedics;  Laterality: Left;  3C BED   WRIST SURGERY Right    x 2     Family History  Problem Relation Age of Onset   Colon cancer Maternal Grandmother    Colon cancer Maternal Great-grandmother    Prostate cancer Neg Hx       Social History   Socioeconomic History   Marital status: Married    Spouse name: Not on file   Number of children: 6   Years of education: Not on file   Highest education level: Not on file  Occupational History   Occupation: Courtyard  Tobacco Use   Smoking status: Every Day    Packs/day: 2.00    Years: 45.00    Pack years: 90.00    Types: Cigarettes   Smokeless tobacco: Never  Vaping Use   Vaping Use:  Never used  Substance and Sexual Activity   Alcohol use: Yes    Comment: occasional   Drug use: No   Sexual activity: Not Currently  Other Topics Concern   Not on file  Social History Narrative   Married for 9 years with 3 sons and 3 daughters. Smokes a ppd.    Social Determinants of Health   Financial Resource Strain: Not on file  Food Insecurity: Not on file  Transportation Needs: Not on file  Physical Activity: Not on file  Stress: Not on file  Social Connections: Not on file    Allergies  Allergen Reactions   Neurontin [Gabapentin] Itching and Other (See Comments)    Confusion and sedation.     Current Outpatient Medications:    Acetaminophen-Codeine 300-30 MG tablet, , Disp: , Rfl:    cefdinir (OMNICEF)  300 MG capsule, Take 1 capsule (300 mg total) by mouth 2 (two) times daily., Disp: 60 capsule, Rfl: 11   CELEBREX 200 MG capsule, Take 200 mg by mouth 2 (two) times daily., Disp: , Rfl:    doxycycline (VIBRA-TABS) 100 MG tablet, Take 1 tablet (100 mg total) by mouth 2 (two) times daily., Disp: 60 tablet, Rfl: 11   HYDROcodone-acetaminophen (NORCO/VICODIN) 5-325 MG tablet, Take by mouth., Disp: , Rfl:    MEDROL 4 MG TBPK tablet, Take by mouth., Disp: , Rfl:    methocarbamol (ROBAXIN) 500 MG tablet, Take 1 tablet (500 mg total) by mouth every 6 (six) hours as needed for muscle spasms. (Patient not taking: Reported on 02/28/2021), Disp: 30 tablet, Rfl: 2   morphine (MS CONTIN) 30 MG 12 hr tablet, Take 30 mg by mouth 2 (two) times daily as needed., Disp: , Rfl:    morphine (MSIR) 15 MG tablet, Take 15 mg by mouth 4 (four) times daily as needed., Disp: , Rfl:    Multiple Vitamin (MULTIVITAMIN WITH MINERALS) TABS tablet, Take 1 tablet by mouth daily., Disp: , Rfl:    oxybutynin (DITROPAN) 5 MG tablet, Take 1 tablet (5 mg total) by mouth 3 (three) times daily as needed for bladder spasms. Stop 24 hours prior to follow-up appointment. (Patient not taking: No sig reported), Disp: 30  tablet, Rfl: 1   oxyCODONE-acetaminophen (PERCOCET/ROXICET) 5-325 MG tablet, Take 1-2 tablets by mouth every 4 (four) hours as needed for moderate pain or severe pain., Disp: 30 tablet, Rfl: 0   sildenafil (VIAGRA) 100 MG tablet, TAKE ONE TABLET BY MOUTH AS NEEDED *DO NOT TAKE MEDICATION WITH YOUR INJECTIONS FOR ERECTILE DYSFUNCTION AT THE SAME TIME*, Disp: , Rfl:    tiZANidine (ZANAFLEX) 4 MG tablet, Take 4 mg by mouth at bedtime., Disp: , Rfl:    VALIUM 5 MG tablet, Take by mouth., Disp: , Rfl:    vitamin B-12 (CYANOCOBALAMIN) 500 MCG tablet, Take 500 mcg by mouth daily. (Patient not taking: Reported on 02/28/2021), Disp: , Rfl:    Review of Systems  Constitutional:  Negative for activity change, appetite change, chills, diaphoresis, fatigue, fever and unexpected weight change.  HENT:  Negative for congestion, rhinorrhea, sinus pressure, sneezing, sore throat and trouble swallowing.   Eyes:  Negative for photophobia and visual disturbance.  Respiratory:  Negative for cough, chest tightness, shortness of breath, wheezing and stridor.   Cardiovascular:  Negative for chest pain, palpitations and leg swelling.  Gastrointestinal:  Negative for abdominal distention, abdominal pain, anal bleeding, blood in stool, constipation, diarrhea, nausea and vomiting.  Genitourinary:  Negative for difficulty urinating, dysuria, flank pain and hematuria.  Musculoskeletal:  Positive for back pain. Negative for arthralgias, gait problem, joint swelling and myalgias.  Skin:  Negative for color change, pallor, rash and wound.  Neurological:  Negative for dizziness, tremors, weakness and light-headedness.  Hematological:  Negative for adenopathy. Does not bruise/bleed easily.  Psychiatric/Behavioral:  Negative for agitation, behavioral problems, confusion, decreased concentration, dysphoric mood and sleep disturbance.       Objective:   Physical Exam Constitutional:      Appearance: He is well-developed.  HENT:      Head: Normocephalic and atraumatic.  Eyes:     General:        Right eye: No discharge.        Left eye: No discharge.     Conjunctiva/sclera: Conjunctivae normal.  Cardiovascular:     Rate and Rhythm: Normal rate and regular rhythm.  Pulmonary:     Effort: Pulmonary effort is normal. No respiratory distress.     Breath sounds: No wheezing.  Abdominal:     General: There is no distension.     Palpations: Abdomen is soft.  Musculoskeletal:        General: No tenderness. Normal range of motion.     Cervical back: Normal range of motion and neck supple.  Skin:    General: Skin is warm and dry.     Coloration: Skin is not pale.     Findings: No erythema or rash.  Neurological:     General: No focal deficit present.     Mental Status: He is alert and oriented to person, place, and time.  Psychiatric:        Mood and Affect: Mood normal.        Behavior: Behavior normal.        Thought Content: Thought content normal.        Judgment: Judgment normal.          Assessment & Plan:   Vertebral osteomyelitis discitis associate with hardware L1-L2:  We will recheck sed rate CRP basic metabolic panel CBC with differential.  Am continue his doxycycline as well as his cefdinir .  He can return to clinic in 4 months.

## 2021-06-24 LAB — BASIC METABOLIC PANEL WITH GFR
BUN: 12 mg/dL (ref 7–25)
CO2: 28 mmol/L (ref 20–32)
Calcium: 9.5 mg/dL (ref 8.6–10.3)
Chloride: 103 mmol/L (ref 98–110)
Creat: 0.96 mg/dL (ref 0.70–1.35)
Glucose, Bld: 110 mg/dL — ABNORMAL HIGH (ref 65–99)
Potassium: 4.8 mmol/L (ref 3.5–5.3)
Sodium: 140 mmol/L (ref 135–146)
eGFR: 88 mL/min/{1.73_m2} (ref >=60)

## 2021-06-24 LAB — CBC WITH DIFFERENTIAL/PLATELET
Absolute Monocytes: 540 cells/uL (ref 200–950)
Basophils Absolute: 72 cells/uL (ref 0–200)
Basophils Relative: 1 %
Eosinophils Absolute: 151 cells/uL (ref 15–500)
Eosinophils Relative: 2.1 %
HCT: 43.4 % (ref 38.5–50.0)
Hemoglobin: 14.4 g/dL (ref 13.2–17.1)
Lymphs Abs: 1937 cells/uL (ref 850–3900)
MCH: 29.9 pg (ref 27.0–33.0)
MCHC: 33.2 g/dL (ref 32.0–36.0)
MCV: 90 fL (ref 80.0–100.0)
MPV: 9.7 fL (ref 7.5–12.5)
Monocytes Relative: 7.5 %
Neutro Abs: 4500 cells/uL (ref 1500–7800)
Neutrophils Relative %: 62.5 %
Platelets: 334 10*3/uL (ref 140–400)
RBC: 4.82 10*6/uL (ref 4.20–5.80)
RDW: 12.4 % (ref 11.0–15.0)
Total Lymphocyte: 26.9 %
WBC: 7.2 10*3/uL (ref 3.8–10.8)

## 2021-06-24 LAB — C-REACTIVE PROTEIN: CRP: 8.7 mg/L — ABNORMAL HIGH (ref <8.0)

## 2021-06-24 LAB — SEDIMENTATION RATE

## 2021-09-20 ENCOUNTER — Other Ambulatory Visit: Payer: Self-pay | Admitting: Internal Medicine

## 2021-09-20 DIAGNOSIS — R0989 Other specified symptoms and signs involving the circulatory and respiratory systems: Secondary | ICD-10-CM

## 2021-09-25 ENCOUNTER — Ambulatory Visit
Admission: RE | Admit: 2021-09-25 | Discharge: 2021-09-25 | Disposition: A | Discharge status: Home / Self Care | Payer: 59 | Source: Ambulatory Visit | Attending: Internal Medicine | Admitting: Internal Medicine

## 2021-09-25 DIAGNOSIS — R0989 Other specified symptoms and signs involving the circulatory and respiratory systems: Secondary | ICD-10-CM

## 2021-10-04 NOTE — Progress Notes (Deleted)
Office Note     CC:  *** Requesting Provider:  Wenda Low, MD  HPI: Tyrone [name] is a 65 y.o. (04-Oct-1956) male presenting at the request of .Wenda Low, MD ***  Vertebral osteomyelitis discitis associate with hardware L1-L2:  The pt is *** on a statin for cholesterol management.  The pt is *** on a daily aspirin.   Other AC:  *** The pt is *** on medication for hypertension.   The pt is *** diabetic.  Tobacco hx:  ***  Past Medical History:  Diagnosis Date   Arthritis    hands, knees, back   COPD (chronic obstructive pulmonary disease) ([place])    Depression    Diskitis 3/41/9379   Hardware complicating wound infection ([place]) 02/28/2021   Headache    migraines   Hypertension    Legionella pneumonia ([place]) 02/28/2021   Neuromuscular disorder ([place])    hands   Prostate cancer [facility])     Past Surgical History:  Procedure Laterality Date   APPENDECTOMY     BACK SURGERY  1980"s   L4 to L 5 laminectomy   COLONOSCOPY WITH PROPOFOL N/A 02/06/2016   Procedure: COLONOSCOPY WITH PROPOFOL;  Surgeon: Garlan Fair, MD;  [facility];  Service: Endoscopy;  Laterality: N/A;   DIAGNOSTIC LAPAROSCOPY     LAPAROSCOPIC APPENDECTOMY N/A 08/13/2018   Procedure: APPENDECTOMY LAPAROSCOPIC;  Surgeon: Alphonsa Overall, MD;  [facility];  Service: General;  Laterality: N/A;   PELVIC LYMPH NODE DISSECTION Bilateral 02/08/2019   Procedure: PELVIC LYMPH NODE DISSECTION;  Surgeon: Lucas Mallow, MD;  [facility];  Service: Urology;  Laterality: Bilateral;   ROBOT ASSISTED LAPAROSCOPIC RADICAL PROSTATECTOMY N/A 02/08/2019   Procedure: XI ROBOTIC ASSISTED LAPAROSCOPIC RADICAL PROSTATECTOMY;  Surgeon: Lucas Mallow, MD;  [facility];  Service: Urology;  Laterality: N/A;   TRANSFORAMINAL LUMBAR INTERBODY FUSION (TLIF) WITH PEDICLE SCREW FIXATION 1 LEVEL Left 09/12/2020   Procedure: LEFT-SIDED LUMBAR 1 - LUMBAR 2 TRANSFORAMINAL LUMBAR INTERBODY FUSION WITH  INSTRUMENTATION AND ALLOGRAFT;  Surgeon: Phylliss Bob, MD;  Location: [facility];  Service: Orthopedics;  Laterality: Left;  3C BED   WRIST SURGERY Right    x 2     Social History   Socioeconomic History   Marital status: Married    Spouse name: Not on file   Number of children: 6   Years of education: Not on file   Highest education level: Not on file  Occupational History   Occupation: Courtyard  Tobacco Use   Smoking status: Every Day    Packs/day: 2.00    Years: 45.00    Pack years: 90.00    Types: Cigarettes   Smokeless tobacco: Never  Vaping Use   Vaping Use: Never used  Substance and Sexual Activity   Alcohol use: Yes    Comment: occasional   Drug use: No   Sexual activity: Not Currently  Other Topics Concern   Not on file  Social History Narrative   Married for 49 years with 3 sons and 3 daughters. Smokes a ppd.    Social Determinants of Health   Financial Resource Strain: Not on file  Food Insecurity: Not on file  Transportation Needs: Not on file  Physical Activity: Not on file  Stress: Not on file  Social Connections: Not on file  Intimate Partner Violence: Not on file   *** Family History  Problem Relation Age of Onset   Colon cancer Maternal Grandmother  Colon cancer Maternal Great-grandmother    Prostate cancer Neg Hx     Current Outpatient Medications  Medication Sig Dispense Refill   cefdinir (OMNICEF) 300 MG capsule Take 1 capsule (300 mg total) by mouth 2 (two) times daily. 60 capsule 11   doxycycline (VIBRA-TABS) 100 MG tablet Take 1 tablet (100 mg total) by mouth 2 (two) times daily. 60 tablet 11   morphine (MS CONTIN) 30 MG 12 hr tablet Take 30 mg by mouth 2 (two) times daily as needed.     morphine (MSIR) 15 MG tablet Take 15 mg by mouth 4 (four) times daily as needed.     Multiple Vitamin (MULTIVITAMIN WITH MINERALS) TABS tablet Take 1 tablet by mouth daily.     oxyCODONE-acetaminophen (PERCOCET/ROXICET) 5-325 MG tablet Take 1-2 tablets  by mouth every 4 (four) hours as needed for moderate pain or severe pain. (Patient not taking: No sig reported) 30 tablet 0   sildenafil (VIAGRA) 100 MG tablet TAKE ONE TABLET BY MOUTH AS NEEDED *DO NOT TAKE MEDICATION WITH YOUR INJECTIONS FOR ERECTILE DYSFUNCTION AT THE SAME TIME*     tiZANidine (ZANAFLEX) 4 MG tablet Take 4 mg by mouth at bedtime.     No current facility-administered medications for this visit.    Allergies  Allergen Reactions   Neurontin [Gabapentin] Itching and Other (See Comments)    Confusion and sedation.     REVIEW OF SYSTEMS:  *** [X]  denotes positive finding, [ ]  denotes negative finding Cardiac  Comments:  Chest pain or chest pressure:    Shortness of breath upon exertion:    Short of breath when lying flat:    Irregular heart rhythm:        Vascular    Pain in calf, thigh, or hip brought on by ambulation:    Pain in feet at night that wakes you up from your sleep:     Blood clot in your veins:    Leg swelling:         Pulmonary    Oxygen at home:    Productive cough:     Wheezing:         Neurologic    Sudden weakness in arms or legs:     Sudden numbness in arms or legs:     Sudden onset of difficulty speaking or slurred speech:    Temporary loss of vision in one eye:     Problems with dizziness:         Gastrointestinal    Blood in stool:     Vomited blood:         Genitourinary    Burning when urinating:     Blood in urine:        Psychiatric    Major depression:         Hematologic    Bleeding problems:    Problems with blood clotting too easily:        Skin    Rashes or ulcers:        Constitutional    Fever or chills:      PHYSICAL EXAMINATION:  There were no vitals filed for this visit.  General:  WDWN in NAD; vital signs documented above Gait: Not observed HENT: WNL, normocephalic Pulmonary: normal non-labored breathing , without wheezing Cardiac: {Desc; regular/irreg:14544} HR, bruit*** Abdomen: soft, NT, no  masses Skin: {With/Without:20273} rashes Vascular Exam/Pulses:  Right Left  Radial {Exam; arterial pulse strength 0-4:30167} {Exam; arterial pulse strength 0-4:30167}  Ulnar {Exam;  arterial pulse strength 0-4:30167} {Exam; arterial pulse strength 0-4:30167}  Femoral {Exam; arterial pulse strength 0-4:30167} {Exam; arterial pulse strength 0-4:30167}  Popliteal {Exam; arterial pulse strength 0-4:30167} {Exam; arterial pulse strength 0-4:30167}  DP {Exam; arterial pulse strength 0-4:30167} {Exam; arterial pulse strength 0-4:30167}  PT {Exam; arterial pulse strength 0-4:30167} {Exam; arterial pulse strength 0-4:30167}   Extremities: {With/Without:20273} ischemic changes, {With/Without:20273} Gangrene , {With/Without:20273} cellulitis; {With/Without:20273} open wounds;  Musculoskeletal: no muscle wasting or atrophy  Neurologic: A&O X 3;  No focal weakness or paresthesias are detected Psychiatric:  The pt has {Desc; normal/abnormal:11317::"Normal"} affect.   Non-Invasive Vascular Imaging:   CLINICAL DATA: Decreased pedal pulses  Bilateral claudication  History of smoking  EXAM: NONINVASIVE PHYSIOLOGIC VASCULAR STUDY OF BILATERAL LOWER EXTREMITIES  TECHNIQUE: Evaluation of both lower extremities were performed at rest, including calculation of ankle-brachial indices with single level Doppler, pressure and pulse volume recording.  COMPARISON: None.  FINDINGS: Right ABI: 0.44  Right digit: 0.41 (62 mm Hg is)  Left ABI: 0.40  Left digit: 0.28 (43 mm Hg)  Right Lower Extremity: Monophasic waveform seen at the popliteal, posterior tibial, and dorsalis pedis arteries.  Left Lower Extremity: Monophasic waveform seen at popliteal, posterior tibial, dorsalis pedis arteries.  < 0.5 Severe PAD  IMPRESSION: Severe peripheral arterial disease of the lower extremities.   Electronically Signed By: Miachel Roux M.D. On: 09/25/2021 14:02     ASSESSMENT/PLAN: Tyrone [name] is a  65 y.o. male presenting with ***   ***   Broadus John, MD [facility] [phone]

## 2021-10-05 ENCOUNTER — Encounter: Payer: 59 | Admitting: Vascular Surgery

## 2021-10-16 ENCOUNTER — Ambulatory Visit: Payer: 59 | Admitting: Vascular Surgery

## 2021-10-16 ENCOUNTER — Other Ambulatory Visit: Payer: Self-pay

## 2021-10-16 ENCOUNTER — Encounter: Payer: Self-pay | Admitting: Vascular Surgery

## 2021-10-16 DIAGNOSIS — I739 Peripheral vascular disease, unspecified: Secondary | ICD-10-CM

## 2021-10-16 NOTE — Progress Notes (Signed)
Patient name: Tyrone [name]. MRN: [id number] DOB: 1956-10-07 Sex: male  REASON FOR CONSULT: Evaluate severe PAD  HPI: Lautaro Koral. is a 66 y.o. male, with history of COPD, hypertension, prostate cancer, tobacco abuse that presents for evaluation of severe PAD.  Patient describes years of bilateral lower extremity cramping in the calves when he walks.  This has gotten much more severe recently and now he is really unable to walk more than several hundred feet without stopping.  He states that recently he started getting numbness and pain in the feet at night that wakes him up as well.  He does feel the left leg is slightly worse than the right.  No previous interventions.  He did have ABIs by his PCP that showed ABI of 0.44 on the right and 0.40 on the left.  Past Medical History:  Diagnosis Date   Arthritis    hands, knees, back   COPD (chronic obstructive pulmonary disease) ([place])    Depression    Diskitis 0/06/2329   Hardware complicating wound infection ([place]) 02/28/2021   Headache    migraines   Hypertension    Legionella pneumonia ([place]) 02/28/2021   Neuromuscular disorder ([place])    hands   Prostate cancer [facility])     Past Surgical History:  Procedure Laterality Date   APPENDECTOMY     BACK SURGERY  1980"s   L4 to L 5 laminectomy   COLONOSCOPY WITH PROPOFOL N/A 02/06/2016   Procedure: COLONOSCOPY WITH PROPOFOL;  Surgeon: Garlan Fair, MD;  [facility];  Service: Endoscopy;  Laterality: N/A;   DIAGNOSTIC LAPAROSCOPY     LAPAROSCOPIC APPENDECTOMY N/A 08/13/2018   Procedure: APPENDECTOMY LAPAROSCOPIC;  Surgeon: Alphonsa Overall, MD;  [facility];  Service: General;  Laterality: N/A;   PELVIC LYMPH NODE DISSECTION Bilateral 02/08/2019   Procedure: PELVIC LYMPH NODE DISSECTION;  Surgeon: Lucas Mallow, MD;  [facility];  Service: Urology;  Laterality: Bilateral;   ROBOT ASSISTED LAPAROSCOPIC RADICAL PROSTATECTOMY N/A 02/08/2019   Procedure: XI  ROBOTIC ASSISTED LAPAROSCOPIC RADICAL PROSTATECTOMY;  Surgeon: Lucas Mallow, MD;  [facility];  Service: Urology;  Laterality: N/A;   TRANSFORAMINAL LUMBAR INTERBODY FUSION (TLIF) WITH PEDICLE SCREW FIXATION 1 LEVEL Left 09/12/2020   Procedure: LEFT-SIDED LUMBAR 1 - LUMBAR 2 TRANSFORAMINAL LUMBAR INTERBODY FUSION WITH INSTRUMENTATION AND ALLOGRAFT;  Surgeon: Phylliss Bob, MD;  Location: [place];  Service: Orthopedics;  Laterality: Left;  3C BED   WRIST SURGERY Right    x 2     Family History  Problem Relation Age of Onset   Colon cancer Maternal Grandmother    Colon cancer Maternal Great-grandmother    Prostate cancer Neg Hx     SOCIAL HISTORY: Social History   Socioeconomic History   Marital status: Married    Spouse name: Not on file   Number of children: 6   Years of education: Not on file   Highest education level: Not on file  Occupational History   Occupation: [employer]  Tobacco Use   Smoking status: Every Day    Packs/day: 2.00    Years: 45.00    Pack years: 90.00    Types: Cigarettes   Smokeless tobacco: Never  Vaping Use   Vaping Use: Never used  Substance and Sexual Activity   Alcohol use: Yes    Comment: occasional   Drug use: No   Sexual activity: Not Currently  Other Topics Concern   Not on  file  Social History Narrative   Married for 89 years with 3 sons and 3 daughters. Smokes a ppd.    Social Determinants of Health   Financial Resource Strain: Not on file  Food Insecurity: Not on file  Transportation Needs: Not on file  Physical Activity: Not on file  Stress: Not on file  Social Connections: Not on file  Intimate Partner Violence: Not on file    Allergies  Allergen Reactions   Neurontin [Gabapentin] Itching and Other (See Comments)    Confusion and sedation.    Current Outpatient Medications  Medication Sig Dispense Refill   cefdinir (OMNICEF) 300 MG capsule Take 1 capsule (300 mg total) by mouth 2 (two) times daily. 60 capsule  11   doxycycline (VIBRA-TABS) 100 MG tablet Take 1 tablet (100 mg total) by mouth 2 (two) times daily. 60 tablet 11   morphine (MS CONTIN) 30 MG 12 hr tablet Take 30 mg by mouth 2 (two) times daily as needed.     morphine (MSIR) 15 MG tablet Take 15 mg by mouth 4 (four) times daily as needed.     Multiple Vitamin (MULTIVITAMIN WITH MINERALS) TABS tablet Take 1 tablet by mouth daily.     oxyCODONE-acetaminophen (PERCOCET/ROXICET) 5-325 MG tablet Take 1-2 tablets by mouth every 4 (four) hours as needed for moderate pain or severe pain. (Patient not taking: No sig reported) 30 tablet 0   sildenafil (VIAGRA) 100 MG tablet TAKE ONE TABLET BY MOUTH AS NEEDED *DO NOT TAKE MEDICATION WITH YOUR INJECTIONS FOR ERECTILE DYSFUNCTION AT THE SAME TIME*     tiZANidine (ZANAFLEX) 4 MG tablet Take 4 mg by mouth at bedtime.     No current facility-administered medications for this visit.    REVIEW OF SYSTEMS:  [X]  denotes positive finding, [ ]  denotes negative finding Cardiac  Comments:  Chest pain or chest pressure:    Shortness of breath upon exertion:    Short of breath when lying flat:    Irregular heart rhythm:        Vascular    Pain in calf, thigh, or hip brought on by ambulation: x   Pain in feet at night that wakes you up from your sleep:  x   Blood clot in your veins:    Leg swelling:         Pulmonary    Oxygen at home:    Productive cough:     Wheezing:         Neurologic    Sudden weakness in arms or legs:     Sudden numbness in arms or legs:     Sudden onset of difficulty speaking or slurred speech:    Temporary loss of vision in one eye:     Problems with dizziness:         Gastrointestinal    Blood in stool:     Vomited blood:         Genitourinary    Burning when urinating:     Blood in urine:        Psychiatric    Major depression:         Hematologic    Bleeding problems:    Problems with blood clotting too easily:        Skin    Rashes or ulcers:         Constitutional    Fever or chills:      PHYSICAL EXAM: Vitals:   10/16/21 0823  BP: Marland Kitchen)  148/75  Pulse: 71  Resp: 18  Temp: 98.2 F (36.8 C)  TempSrc: Temporal  SpO2: 97%  Weight: 195 lb (88.5 kg)  Height: 5\' 10"  (1.778 m)    GENERAL: The patient is a well-nourished male, in no acute distress. The vital signs are documented above. CARDIAC: There is a regular rate and rhythm.  VASCULAR:  Palpable femoral pulses bilaterally No palpable popliteal pulses No palpable pedal pulses bilaterally No open wounds PULMONARY: No respiratory distress. ABDOMEN: Soft and non-tender. MUSCULOSKELETAL: There are no major deformities or cyanosis. NEUROLOGIC: No focal weakness or paresthesias are detected. SKIN: There are no ulcers or rashes noted. PSYCHIATRIC: The patient has a normal affect.  DATA:   ABIs 09/25/21 were 0.44 on the right and 0.4 on the left  Assessment/Plan:  66 year old male presents with severe PAD in the bilateral lower extremities.  He has a classic history of bilateral lower extremity lifestyle limiting claudication and recently has had symptom progression that sounds consistent with rest pain slightly worse on the left.  I can appreciate femoral pulses bilaterally, although I do not feel any popliteal or pedal pulses.  I have recommended aortogram with bilateral lower extremity arteriogram and possible endovascular intervention.  We will get imaging of both lower extremities but initial focus for intervention will be the left leg.  Discussed if no endovascular options may ultimately require open surgical bypass.  Risk benefits discussed.  We will get him scheduled for Thursday in the cath lab.  He is on aspirin already and I talked about the importance of smoking cessation.   Marty Heck, MD [facility] [place] Office: [phone]

## 2021-10-18 ENCOUNTER — Other Ambulatory Visit: Payer: Self-pay

## 2021-10-18 ENCOUNTER — Ambulatory Visit (HOSPITAL_COMMUNITY)
Admission: RE | Admit: 2021-10-18 | Discharge: 2021-10-18 | Disposition: A | Discharge status: Home / Self Care | Payer: 59 | Attending: Vascular Surgery | Admitting: Vascular Surgery

## 2021-10-18 ENCOUNTER — Ambulatory Visit (HOSPITAL_BASED_OUTPATIENT_CLINIC_OR_DEPARTMENT_OTHER): Payer: 59

## 2021-10-18 ENCOUNTER — Encounter (HOSPITAL_COMMUNITY)
Admission: RE | Disposition: A | Discharge status: Home / Self Care | Payer: Self-pay | Source: Home / Self Care | Attending: Vascular Surgery

## 2021-10-18 DIAGNOSIS — Z8546 Personal history of malignant neoplasm of prostate: Secondary | ICD-10-CM | POA: Diagnosis not present

## 2021-10-18 DIAGNOSIS — I70223 Atherosclerosis of native arteries of extremities with rest pain, bilateral legs: Secondary | ICD-10-CM | POA: Insufficient documentation

## 2021-10-18 DIAGNOSIS — J449 Chronic obstructive pulmonary disease, unspecified: Secondary | ICD-10-CM | POA: Diagnosis not present

## 2021-10-18 DIAGNOSIS — F17210 Nicotine dependence, cigarettes, uncomplicated: Secondary | ICD-10-CM | POA: Insufficient documentation

## 2021-10-18 DIAGNOSIS — I10 Essential (primary) hypertension: Secondary | ICD-10-CM | POA: Diagnosis not present

## 2021-10-18 DIAGNOSIS — I739 Peripheral vascular disease, unspecified: Secondary | ICD-10-CM

## 2021-10-18 DIAGNOSIS — Z7982 Long term (current) use of aspirin: Secondary | ICD-10-CM | POA: Diagnosis not present

## 2021-10-18 DIAGNOSIS — Z01810 Encounter for preprocedural cardiovascular examination: Secondary | ICD-10-CM

## 2021-10-18 HISTORY — PX: ABDOMINAL AORTOGRAM W/LOWER EXTREMITY: CATH118223

## 2021-10-18 LAB — POCT I-STAT, CHEM 8
BUN: 18 mg/dL (ref 8–23)
Calcium, Ion: 1.3 mmol/L (ref 1.15–1.40)
Chloride: 103 mmol/L (ref 98–111)
Creatinine, Ser: 0.8 mg/dL (ref 0.61–1.24)
Glucose, Bld: 103 mg/dL — ABNORMAL HIGH (ref 70–99)
HCT: 45 % (ref 39.0–52.0)
Hemoglobin: 15.3 g/dL (ref 13.0–17.0)
Potassium: 4.6 mmol/L (ref 3.5–5.1)
Sodium: 139 mmol/L (ref 135–145)
TCO2: 27 mmol/L (ref 22–32)

## 2021-10-18 SURGERY — ABDOMINAL AORTOGRAM W/LOWER EXTREMITY
Anesthesia: LOCAL | Laterality: Bilateral

## 2021-10-18 MED ORDER — LIDOCAINE HCL (PF) 1 % IJ SOLN
INTRAMUSCULAR | Status: DC | PRN
  Administered 2021-10-18: 12 mL

## 2021-10-18 MED ORDER — FENTANYL CITRATE (PF) 100 MCG/2ML IJ SOLN
INTRAMUSCULAR | Status: DC | PRN
  Administered 2021-10-18: 50 ug via INTRAVENOUS

## 2021-10-18 MED ORDER — MIDAZOLAM HCL 2 MG/2ML IJ SOLN
INTRAMUSCULAR | Status: DC | PRN
  Administered 2021-10-18: 2 mg via INTRAVENOUS

## 2021-10-18 MED ORDER — SODIUM CHLORIDE 0.9 % IV SOLN: 250.0000 mL | INTRAVENOUS | Status: DC | PRN

## 2021-10-18 MED ORDER — SODIUM CHLORIDE 0.9 % IV SOLN: INTRAVENOUS | Status: AC

## 2021-10-18 MED ORDER — HYDRALAZINE HCL 20 MG/ML IJ SOLN: 5.0000 mg | INTRAMUSCULAR | Status: DC | PRN

## 2021-10-18 MED ORDER — MIDAZOLAM HCL 2 MG/2ML IJ SOLN
INTRAMUSCULAR | Status: AC
  Filled 2021-10-18: qty 2

## 2021-10-18 MED ORDER — ONDANSETRON HCL 4 MG/2ML IJ SOLN: 4.0000 mg | Freq: Four times a day (QID) | INTRAMUSCULAR | Status: DC | PRN

## 2021-10-18 MED ORDER — SODIUM CHLORIDE 0.9% FLUSH: 3.0000 mL | Freq: Two times a day (BID) | INTRAVENOUS | Status: DC

## 2021-10-18 MED ORDER — HEPARIN (PORCINE) IN NACL 1000-0.9 UT/500ML-% IV SOLN
INTRAVENOUS | Status: AC
  Filled 2021-10-18: qty 1000

## 2021-10-18 MED ORDER — SODIUM CHLORIDE 0.9 % IV SOLN: INTRAVENOUS | Status: DC

## 2021-10-18 MED ORDER — ACETAMINOPHEN 325 MG PO TABS: 650.0000 mg | ORAL_TABLET | ORAL | Status: DC | PRN

## 2021-10-18 MED ORDER — FENTANYL CITRATE (PF) 100 MCG/2ML IJ SOLN
INTRAMUSCULAR | Status: AC
  Filled 2021-10-18: qty 2

## 2021-10-18 MED ORDER — SODIUM CHLORIDE 0.9% FLUSH: 3.0000 mL | INTRAVENOUS | Status: DC | PRN

## 2021-10-18 MED ORDER — LABETALOL HCL 5 MG/ML IV SOLN: 10.0000 mg | INTRAVENOUS | Status: DC | PRN

## 2021-10-18 MED ORDER — HEPARIN (PORCINE) IN NACL 2000-0.9 UNIT/L-% IV SOLN
INTRAVENOUS | Status: DC | PRN
  Administered 2021-10-18: 1000 mL

## 2021-10-18 MED ORDER — IODIXANOL 320 MG/ML IV SOLN
INTRAVENOUS | Status: DC | PRN
  Administered 2021-10-18: 140 mL

## 2021-10-18 MED ORDER — LIDOCAINE HCL (PF) 1 % IJ SOLN
INTRAMUSCULAR | Status: AC
  Filled 2021-10-18: qty 30

## 2021-10-18 SURGICAL SUPPLY — 11 items
CATH CROSS OVER TEMPO 5F (CATHETERS) ×1 IMPLANT
CATH OMNI FLUSH 5F 65CM (CATHETERS) ×1 IMPLANT
GUIDEWIRE ANGLED .035X150CM (WIRE) ×1 IMPLANT
KIT MICROPUNCTURE NIT STIFF (SHEATH) ×1 IMPLANT
KIT PV (KITS) ×3 IMPLANT
SHEATH PINNACLE 5F 10CM (SHEATH) ×1 IMPLANT
SHEATH PROBE COVER 6X72 (BAG) ×1 IMPLANT
SYR MEDRAD MARK V 150ML (SYRINGE) ×1 IMPLANT
TRANSDUCER W/STOPCOCK (MISCELLANEOUS) ×3 IMPLANT
TRAY PV CATH (CUSTOM PROCEDURE TRAY) ×3 IMPLANT
WIRE BENTSON .035X145CM (WIRE) ×1 IMPLANT

## 2021-10-18 NOTE — Progress Notes (Signed)
Bilateral saphenous vein mapping has been completed. Preliminary results can be found in CV Proc through chart review.   10/18/21 2:38 PM Tyrone [name] RVT

## 2021-10-18 NOTE — H&P (Signed)
History and Physical Interval Note:  10/18/2021 12:15 PM  Tyrone Ee.  has presented today for surgery, with the diagnosis of PAD.  The various methods of treatment have been discussed with the patient and family. After consideration of risks, benefits and other options for treatment, the patient has consented to  Procedure(s): ABDOMINAL AORTOGRAM W/LOWER EXTREMITY (N/A) as a surgical intervention.  The patient's history has been reviewed, patient examined, no change in status, stable for surgery.  I have reviewed the patient's chart and labs.  Questions were answered to the patient's satisfaction.    Aortogram, lower extremity arteriogram.  Will evaluate both legs, initial focus on left leg.  Marty Heck  Patient name: Tyrone [name].    MRN: [id number]        DOB: 1956-01-19          Sex: male   REASON FOR CONSULT: Evaluate severe PAD   HPI: Tyrone [name]. is a 66 y.o. male, with history of COPD, hypertension, prostate cancer, tobacco abuse that presents for evaluation of severe PAD.  Patient describes years of bilateral lower extremity cramping in the calves when he walks.  This has gotten much more severe recently and now he is really unable to walk more than several hundred feet without stopping.  He states that recently he started getting numbness and pain in the feet at night that wakes him up as well.  He does feel the left leg is slightly worse than the right.  No previous interventions.  He did have ABIs by his PCP that showed ABI of 0.44 on the right and 0.40 on the left.       Past Medical History:  Diagnosis Date   Arthritis      hands, knees, back   COPD (chronic obstructive pulmonary disease) ([place])     Depression     Diskitis 1/63/8453   Hardware complicating wound infection ([place]) 02/28/2021   Headache      migraines   Hypertension     Legionella pneumonia ([place]) 02/28/2021   Neuromuscular disorder ([place])      hands   Prostate cancer [facility])              Past Surgical History:  Procedure Laterality Date   APPENDECTOMY       BACK SURGERY   1980"s    L4 to L 5 laminectomy   COLONOSCOPY WITH PROPOFOL N/A 02/06/2016    Procedure: COLONOSCOPY WITH PROPOFOL;  Surgeon: Garlan Fair, MD;  [facility];  Service: Endoscopy;  Laterality: N/A;   DIAGNOSTIC LAPAROSCOPY       LAPAROSCOPIC APPENDECTOMY N/A 08/13/2018    Procedure: APPENDECTOMY LAPAROSCOPIC;  Surgeon: Alphonsa Overall, MD;  [facility];  Service: General;  Laterality: N/A;   PELVIC LYMPH NODE DISSECTION Bilateral 02/08/2019    Procedure: PELVIC LYMPH NODE DISSECTION;  Surgeon: Lucas Mallow, MD;  [facility];  Service: Urology;  Laterality: Bilateral;   ROBOT ASSISTED LAPAROSCOPIC RADICAL PROSTATECTOMY N/A 02/08/2019    Procedure: XI ROBOTIC ASSISTED LAPAROSCOPIC RADICAL PROSTATECTOMY;  Surgeon: Lucas Mallow, MD;  [facility];  Service: Urology;  Laterality: N/A;   TRANSFORAMINAL LUMBAR INTERBODY FUSION (TLIF) WITH PEDICLE SCREW FIXATION 1 LEVEL Left 09/12/2020    Procedure: LEFT-SIDED LUMBAR 1 - LUMBAR 2 TRANSFORAMINAL LUMBAR INTERBODY FUSION WITH INSTRUMENTATION AND ALLOGRAFT;  Surgeon: Phylliss Bob, MD;  Location: [place];  Service: Orthopedics;  Laterality: Left;  3C BED  WRIST SURGERY Right      x 2            Family History  Problem Relation Age of Onset   Colon cancer Maternal Grandmother     Colon cancer Maternal Great-grandmother     Prostate cancer Neg Hx        SOCIAL HISTORY: Social History         Socioeconomic History   Marital status: Married      Spouse name: Not on file   Number of children: 6   Years of education: Not on file   Highest education level: Not on file  Occupational History   Occupation: Cour[employer]  Tobacco Use   Smoking status: Every Day      Packs/day: 2.00      Years: 45.00      Pack years: 90.00      Types: Cigarettes   Smokeless tobacco: Never  Vaping Use   Vaping Use: Never used  Substance and  Sexual Activity   Alcohol use: Yes      Comment: occasional   Drug use: No   Sexual activity: Not Currently  Other Topics Concern   Not on file  Social History Narrative    Married for 36 years with 3 sons and 3 daughters. Smokes a ppd.     Social Determinants of Health    Financial Resource Strain: Not on file  Food Insecurity: Not on file  Transportation Needs: Not on file  Physical Activity: Not on file  Stress: Not on file  Social Connections: Not on file  Intimate Partner Violence: Not on file           Allergies  Allergen Reactions   Neurontin [Gabapentin] Itching and Other (See Comments)      Confusion and sedation.            Current Outpatient Medications  Medication Sig Dispense Refill   cefdinir (OMNICEF) 300 MG capsule Take 1 capsule (300 mg total) by mouth 2 (two) times daily. 60 capsule 11   doxycycline (VIBRA-TABS) 100 MG tablet Take 1 tablet (100 mg total) by mouth 2 (two) times daily. 60 tablet 11   morphine (MS CONTIN) 30 MG 12 hr tablet Take 30 mg by mouth 2 (two) times daily as needed.       morphine (MSIR) 15 MG tablet Take 15 mg by mouth 4 (four) times daily as needed.       Multiple Vitamin (MULTIVITAMIN WITH MINERALS) TABS tablet Take 1 tablet by mouth daily.       oxyCODONE-acetaminophen (PERCOCET/ROXICET) 5-325 MG tablet Take 1-2 tablets by mouth every 4 (four) hours as needed for moderate pain or severe pain. (Patient not taking: No sig reported) 30 tablet 0   sildenafil (VIAGRA) 100 MG tablet TAKE ONE TABLET BY MOUTH AS NEEDED *DO NOT TAKE MEDICATION WITH YOUR INJECTIONS FOR ERECTILE DYSFUNCTION AT THE SAME TIME*       tiZANidine (ZANAFLEX) 4 MG tablet Take 4 mg by mouth at bedtime.        No current facility-administered medications for this visit.      REVIEW OF SYSTEMS:  [X]  denotes positive finding, [ ]  denotes negative finding Cardiac   Comments:  Chest pain or chest pressure:      Shortness of breath upon exertion:      Short of breath  when lying flat:      Irregular heart rhythm:  Vascular      Pain in calf, thigh, or hip brought on by ambulation: x    Pain in feet at night that wakes you up from your sleep:  x    Blood clot in your veins:      Leg swelling:              Pulmonary      Oxygen at home:      Productive cough:       Wheezing:              Neurologic      Sudden weakness in arms or legs:       Sudden numbness in arms or legs:       Sudden onset of difficulty speaking or slurred speech:      Temporary loss of vision in one eye:       Problems with dizziness:              Gastrointestinal      Blood in stool:       Vomited blood:              Genitourinary      Burning when urinating:       Blood in urine:             Psychiatric      Major depression:              Hematologic      Bleeding problems:      Problems with blood clotting too easily:             Skin      Rashes or ulcers:             Constitutional      Fever or chills:          PHYSICAL EXAM:    Vitals:    10/16/21 0823  BP: (!) 148/75  Pulse: 71  Resp: 18  Temp: 98.2 F (36.8 C)  TempSrc: Temporal  SpO2: 97%  Weight: 195 lb (88.5 kg)  Height: 5\' 10"  (1.778 m)      GENERAL: The patient is a well-nourished male, in no acute distress. The vital signs are documented above. CARDIAC: There is a regular rate and rhythm.  VASCULAR:  Palpable femoral pulses bilaterally No palpable popliteal pulses No palpable pedal pulses bilaterally No open wounds PULMONARY: No respiratory distress. ABDOMEN: Soft and non-tender. MUSCULOSKELETAL: There are no major deformities or cyanosis. NEUROLOGIC: No focal weakness or paresthesias are detected. SKIN: There are no ulcers or rashes noted. PSYCHIATRIC: The patient has a normal affect.   DATA:    ABIs 09/25/21 were 0.44 on the right and 0.4 on the left   Assessment/Plan:   66 year old male presents with severe PAD in the bilateral lower extremities.  He has a  classic history of bilateral lower extremity lifestyle limiting claudication and recently has had symptom progression that sounds consistent with rest pain slightly worse on the left.  I can appreciate femoral pulses bilaterally, although I do not feel any popliteal or pedal pulses.  I have recommended aortogram with bilateral lower extremity arteriogram and possible endovascular intervention.  We will get imaging of both lower extremities but initial focus for intervention will be the left leg.  Discussed if no endovascular options may ultimately require open surgical bypass.  Risk benefits discussed.  We will get him scheduled for Thursday in the cath lab.  He is  on aspirin already and I talked about the importance of smoking cessation.     Marty Heck, MD [facility] [place] Office: [phone]

## 2021-10-18 NOTE — Progress Notes (Signed)
Pt ambulated without difficulty or bleeding.   Discharged home with his wife who will drive and stay with pt x 24 hrs. 

## 2021-10-18 NOTE — Progress Notes (Signed)
° ° °  Patient name: Tyrone [name]. MRN: [id number] DOB: 1956-05-23 Sex: male  10/18/2021 Pre-operative Diagnosis: Bilateral lower extremity lifestyle limiting claudication with early rest pain symptoms Post-operative diagnosis:  Same Surgeon:  Marty Heck, MD Procedure Performed: 1.  Ultrasound-guided access right common femoral artery 2.  Aortogram with catheter selection of aorta 3.  Bilateral lower extremity arteriogram with runoff including selection of second-order branches in the left lower extremity 4.  28 minutes of monitored moderate conscious sedation time  Indications: Patient is a 66 year old male seen with worsening bilateral lower extremity lifestyle limiting claudication now with some numbness concerning for rest pain worse in the lower extremities and worse in the left leg.  ABIs were in the 0.4 range and he presents for lower extremity arteriogram and possible intervention after risk benefits discussed.  Findings:   Aortogram showed a patent infrarenal aorta as well as patent renal arteries.  Both iliac arteries were diseased and calcified but no flow-limiting stenosis.  On the left he has about 30% stenosis in the common femoral artery with a patent profunda and a patent SFA that is diffusely diseased.  The above-knee popliteal artery is occluded and he reconstitutes at the knee joint with a patent below-knee popliteal artery and three-vessel runoff.  On the right he also has a diseased common femoral with again about 30% stenosis with a patent profunda and a patent SFA that is diffusely diseased.  The above-knee popliteal artery is occluded and he reconstitutes the popliteal artery just above the knee joint with at least three vessel runoff.   Procedure:  The patient was identified in the holding area and taken to room 8.  The patient was then placed supine on the table and prepped and draped in the usual sterile fashion.  A time out was called.  Ultrasound was used  to evaluate the right common femoral artery.  It was patent .  A digital ultrasound image was acquired.  A micropuncture needle was used to access the right common femoral artery under ultrasound guidance.  An 018 wire was advanced without resistance and a micropuncture sheath was placed.  The 018 wire was removed and a benson wire was placed.  The micropuncture sheath was exchanged for a 5 french sheath.  An omniflush catheter was advanced over the wire to the level of L-1.  An abdominal angiogram was obtained.  Next the catheter was pulled down and bilateral lower extremity runoff was obtained.  Pertinent findings are noted above.  Next, using the omniflush catheter and a benson wire, the aortic bifurcation was crossed and the catheter was placed into theleft external iliac artery and left runoff was obtained mostly to evaluate runoff into the foot..  After evaluating images, I think he would benefit from lower extremity surgical bypass.  Wires and catheters were removed.  He was taken to holding for sheath removal and in stable condition.  Plan: Patient will get vein mapping today.  We will schedule for left common femoral to below-knee popliteal bypass next week.  Will need staged bypass in the right leg as well.  Marty Heck, MD [facility] [place] Office: [phone]

## 2021-10-19 ENCOUNTER — Encounter (HOSPITAL_COMMUNITY): Payer: Self-pay | Admitting: Vascular Surgery

## 2021-10-22 ENCOUNTER — Other Ambulatory Visit: Payer: Self-pay | Admitting: Vascular Surgery

## 2021-10-22 ENCOUNTER — Encounter (HOSPITAL_COMMUNITY): Payer: Self-pay | Admitting: Vascular Surgery

## 2021-10-22 LAB — SARS CORONAVIRUS 2 (TAT 6-24 HRS): SARS Coronavirus 2: NEGATIVE

## 2021-10-23 ENCOUNTER — Encounter (HOSPITAL_COMMUNITY): Payer: Self-pay | Admitting: Vascular Surgery

## 2021-10-23 NOTE — Progress Notes (Signed)
DUE TO COVID-19 ONLY ONE VISITOR IS ALLOWED TO COME WITH YOU AND STAY IN THE WAITING ROOM ONLY DURING PRE OP AND PROCEDURE DAY OF SURGERY.   Two VISITORS MAY VISIT WITH YOU AFTER SURGERY IN YOUR PRIVATE ROOM DURING VISITING HOURS ONLY!  PCP - Dr Wenda Low   Cardiologist - n/a Infectious Disease - Dr Rhina Brackett [name]  Chest x-ray - n/a EKG - 10/18/21 Stress Test - n/a ECHO - n/a Cardiac Cath - n/a  ICD Pacemaker/Loop - n/a  Sleep Study -  Yes CPAP - none (Does not need CPAP)  Aspirin Instructions: Follow your surgeon's instructions on when to stop aspirin prior to surgery,  If no instructions were given by your surgeon then you will need to call the office for those instructions.  STOP now taking any Aspirin (unless otherwise instructed by your surgeon), Aleve, Naproxen, Ibuprofen, Motrin, Advil, Goody's, BC's, all herbal medications, fish oil, and all vitamins.   Coronavirus Screening Covid test on 10/22/21 was negative. Do you have any of the following symptoms:  Cough yes/no: No Fever (>100.45F)  yes/no: No Runny nose yes/no: No Sore throat yes/no: No Difficulty breathing/shortness of breath  yes/no: No  Have you traveled in the last 14 days and where? yes/no: No  Patient verbalized understanding of instructions that were given via phone.

## 2021-10-24 ENCOUNTER — Other Ambulatory Visit: Payer: Self-pay

## 2021-10-24 ENCOUNTER — Inpatient Hospital Stay (HOSPITAL_COMMUNITY)
Admission: RE | Admit: 2021-10-24 | Discharge: 2021-10-27 | DRG: 253 | Disposition: A | Discharge status: Home / Self Care | Payer: 59 | Attending: Vascular Surgery | Admitting: Vascular Surgery

## 2021-10-24 ENCOUNTER — Inpatient Hospital Stay (HOSPITAL_COMMUNITY): Payer: 59 | Admitting: Anesthesiology

## 2021-10-24 ENCOUNTER — Encounter (HOSPITAL_COMMUNITY): Payer: Self-pay | Admitting: Vascular Surgery

## 2021-10-24 ENCOUNTER — Encounter (HOSPITAL_COMMUNITY)
Admission: RE | Disposition: A | Discharge status: Home / Self Care | Payer: Self-pay | Source: Home / Self Care | Attending: Vascular Surgery

## 2021-10-24 DIAGNOSIS — M479 Spondylosis, unspecified: Secondary | ICD-10-CM | POA: Diagnosis present

## 2021-10-24 DIAGNOSIS — M19042 Primary osteoarthritis, left hand: Secondary | ICD-10-CM | POA: Diagnosis present

## 2021-10-24 DIAGNOSIS — M170 Bilateral primary osteoarthritis of knee: Secondary | ICD-10-CM | POA: Diagnosis present

## 2021-10-24 DIAGNOSIS — Z800 Family history of malignant neoplasm of digestive organs: Secondary | ICD-10-CM | POA: Diagnosis not present

## 2021-10-24 DIAGNOSIS — J449 Chronic obstructive pulmonary disease, unspecified: Secondary | ICD-10-CM | POA: Diagnosis present

## 2021-10-24 DIAGNOSIS — I739 Peripheral vascular disease, unspecified: Secondary | ICD-10-CM | POA: Diagnosis present

## 2021-10-24 DIAGNOSIS — M869 Osteomyelitis, unspecified: Secondary | ICD-10-CM | POA: Diagnosis present

## 2021-10-24 DIAGNOSIS — I10 Essential (primary) hypertension: Secondary | ICD-10-CM | POA: Diagnosis present

## 2021-10-24 DIAGNOSIS — I70222 Atherosclerosis of native arteries of extremities with rest pain, left leg: Secondary | ICD-10-CM | POA: Diagnosis present

## 2021-10-24 DIAGNOSIS — Z981 Arthrodesis status: Secondary | ICD-10-CM

## 2021-10-24 DIAGNOSIS — Z888 Allergy status to other drugs, medicaments and biological substances status: Secondary | ICD-10-CM | POA: Diagnosis not present

## 2021-10-24 DIAGNOSIS — F17210 Nicotine dependence, cigarettes, uncomplicated: Secondary | ICD-10-CM | POA: Diagnosis present

## 2021-10-24 DIAGNOSIS — Z8546 Personal history of malignant neoplasm of prostate: Secondary | ICD-10-CM

## 2021-10-24 DIAGNOSIS — M19041 Primary osteoarthritis, right hand: Secondary | ICD-10-CM | POA: Diagnosis present

## 2021-10-24 HISTORY — PX: FEMORAL-POPLITEAL BYPASS GRAFT: SHX937

## 2021-10-24 HISTORY — PX: ENDARTERECTOMY FEMORAL: SHX5804

## 2021-10-24 HISTORY — PX: PATCH ANGIOPLASTY: SHX6230

## 2021-10-24 HISTORY — PX: VEIN HARVEST: SHX6363

## 2021-10-24 LAB — URINALYSIS, ROUTINE W REFLEX MICROSCOPIC
Bilirubin Urine: NEGATIVE
Glucose, UA: NEGATIVE mg/dL
Ketones, ur: NEGATIVE mg/dL
Leukocytes,Ua: NEGATIVE
Nitrite: NEGATIVE
Protein, ur: NEGATIVE mg/dL
Specific Gravity, Urine: >1.03 — ABNORMAL HIGH (ref 1.005–1.030)
pH: 6 (ref 5.0–8.0)

## 2021-10-24 LAB — CBC
HCT: 43.3 % (ref 39.0–52.0)
Hemoglobin: 14.6 g/dL (ref 13.0–17.0)
MCH: 29.7 pg (ref 26.0–34.0)
MCHC: 33.7 g/dL (ref 30.0–36.0)
MCV: 88.2 fL (ref 80.0–100.0)
Platelets: 294 10*3/uL (ref 150–400)
RBC: 4.91 MIL/uL (ref 4.22–5.81)
RDW: 13.2 % (ref 11.5–15.5)
WBC: 6.7 10*3/uL (ref 4.0–10.5)
nRBC: 0 % (ref 0.0–0.2)

## 2021-10-24 LAB — COMPREHENSIVE METABOLIC PANEL
ALT: 23 U/L (ref 0–44)
AST: 23 U/L (ref 15–41)
Albumin: 3.7 g/dL (ref 3.5–5.0)
Alkaline Phosphatase: 77 U/L (ref 38–126)
Anion gap: 14 (ref 5–15)
BUN: 13 mg/dL (ref 8–23)
CO2: 22 mmol/L (ref 22–32)
Calcium: 9.4 mg/dL (ref 8.9–10.3)
Chloride: 99 mmol/L (ref 98–111)
Creatinine, Ser: 0.89 mg/dL (ref 0.61–1.24)
GFR, Estimated: >60 mL/min (ref >60)
Glucose, Bld: 99 mg/dL (ref 70–99)
Potassium: 4 mmol/L (ref 3.5–5.1)
Sodium: 135 mmol/L (ref 135–145)
Total Bilirubin: 0.5 mg/dL (ref 0.3–1.2)
Total Protein: 6.6 g/dL (ref 6.5–8.1)

## 2021-10-24 LAB — SURGICAL PCR SCREEN
MRSA, PCR: NEGATIVE
Staphylococcus aureus: NEGATIVE

## 2021-10-24 LAB — POCT ACTIVATED CLOTTING TIME
Activated Clotting Time: 299 seconds
Activated Clotting Time: 233 seconds

## 2021-10-24 LAB — PROTIME-INR
INR: 0.9 (ref 0.8–1.2)
Prothrombin Time: 11.7 seconds (ref 11.4–15.2)

## 2021-10-24 LAB — URINALYSIS, MICROSCOPIC (REFLEX)

## 2021-10-24 LAB — APTT: aPTT: 28 seconds (ref 24–36)

## 2021-10-24 SURGERY — BYPASS GRAFT FEMORAL-POPLITEAL ARTERY
Anesthesia: General | Site: Leg Upper | Laterality: Left

## 2021-10-24 MED ORDER — FENTANYL CITRATE (PF) 250 MCG/5ML IJ SOLN
INTRAMUSCULAR | Status: AC
  Filled 2021-10-24: qty 5

## 2021-10-24 MED ORDER — CHLORHEXIDINE GLUCONATE CLOTH 2 % EX PADS: 6.0000 | MEDICATED_PAD | Freq: Once | CUTANEOUS | Status: DC

## 2021-10-24 MED ORDER — LACTATED RINGERS IV SOLN: INTRAVENOUS | Status: DC

## 2021-10-24 MED ORDER — HYDRALAZINE HCL 20 MG/ML IJ SOLN: 5.0000 mg | INTRAMUSCULAR | Status: DC | PRN

## 2021-10-24 MED ORDER — PHENYLEPHRINE 40 MCG/ML (10ML) SYRINGE FOR IV PUSH (FOR BLOOD PRESSURE SUPPORT)
PREFILLED_SYRINGE | INTRAVENOUS | Status: DC | PRN
  Administered 2021-10-24 (×2): 80 ug via INTRAVENOUS

## 2021-10-24 MED ORDER — DOCUSATE SODIUM 100 MG PO CAPS
100.0000 mg | ORAL_CAPSULE | Freq: Every day | ORAL | Status: DC
  Administered 2021-10-25 – 2021-10-27 (×3): 100 mg via ORAL
  Filled 2021-10-24 (×3): qty 1

## 2021-10-24 MED ORDER — PROPOFOL 10 MG/ML IV BOLUS
INTRAVENOUS | Status: DC | PRN
Start: 2021-10-24 — End: 2021-10-24
  Administered 2021-10-24: 150 mg via INTRAVENOUS

## 2021-10-24 MED ORDER — ASPIRIN EC 81 MG PO TBEC
81.0000 mg | DELAYED_RELEASE_TABLET | Freq: Every day | ORAL | Status: DC
  Administered 2021-10-25 – 2021-10-27 (×3): 81 mg via ORAL
  Filled 2021-10-24 (×3): qty 1

## 2021-10-24 MED ORDER — OXYCODONE-ACETAMINOPHEN 5-325 MG PO TABS
1.0000 | ORAL_TABLET | ORAL | Status: DC | PRN
  Administered 2021-10-24 – 2021-10-25 (×3): 2 via ORAL
  Filled 2021-10-24 (×4): qty 2

## 2021-10-24 MED ORDER — ORAL CARE MOUTH RINSE: 15.0000 mL | Freq: Once | OROMUCOSAL | Status: AC

## 2021-10-24 MED ORDER — HEPARIN SODIUM (PORCINE) 1000 UNIT/ML IJ SOLN
INTRAMUSCULAR | Status: DC | PRN
Start: 2021-10-24 — End: 2021-10-24
  Administered 2021-10-24: 9000 [IU] via INTRAVENOUS
  Administered 2021-10-24: 2000 [IU] via INTRAVENOUS

## 2021-10-24 MED ORDER — LIDOCAINE 2% (20 MG/ML) 5 ML SYRINGE
INTRAMUSCULAR | Status: DC | PRN
Start: 2021-10-24 — End: 2021-10-24
  Administered 2021-10-24: 60 mg via INTRAVENOUS

## 2021-10-24 MED ORDER — ACETAMINOPHEN 650 MG RE SUPP: 325.0000 mg | RECTAL | Status: DC | PRN

## 2021-10-24 MED ORDER — ROCURONIUM BROMIDE 10 MG/ML (PF) SYRINGE
PREFILLED_SYRINGE | INTRAVENOUS | Status: AC
  Filled 2021-10-24: qty 10

## 2021-10-24 MED ORDER — ONDANSETRON HCL 4 MG/2ML IJ SOLN
INTRAMUSCULAR | Status: DC | PRN
  Administered 2021-10-24: 4 mg via INTRAVENOUS

## 2021-10-24 MED ORDER — PHENYLEPHRINE HCL-NACL 20-0.9 MG/250ML-% IV SOLN
INTRAVENOUS | Status: DC | PRN
  Administered 2021-10-24: 25 ug/min via INTRAVENOUS

## 2021-10-24 MED ORDER — CEFAZOLIN SODIUM-DEXTROSE 2-4 GM/100ML-% IV SOLN
2.0000 g | Freq: Three times a day (TID) | INTRAVENOUS | Status: AC
  Administered 2021-10-24 – 2021-10-25 (×2): 2 g via INTRAVENOUS
  Filled 2021-10-24 (×2): qty 100

## 2021-10-24 MED ORDER — METOPROLOL TARTRATE 5 MG/5ML IV SOLN: 2.0000 mg | INTRAVENOUS | Status: DC | PRN

## 2021-10-24 MED ORDER — SENNOSIDES-DOCUSATE SODIUM 8.6-50 MG PO TABS
1.0000 | ORAL_TABLET | Freq: Every evening | ORAL | Status: DC | PRN
  Administered 2021-10-26: 1 via ORAL
  Filled 2021-10-24: qty 1

## 2021-10-24 MED ORDER — MAGNESIUM SULFATE 2 GM/50ML IV SOLN: 2.0000 g | Freq: Every day | INTRAVENOUS | Status: DC | PRN

## 2021-10-24 MED ORDER — CEFDINIR 300 MG PO CAPS
300.0000 mg | ORAL_CAPSULE | Freq: Two times a day (BID) | ORAL | Status: DC
  Administered 2021-10-24 – 2021-10-27 (×6): 300 mg via ORAL
  Filled 2021-10-24 (×7): qty 1

## 2021-10-24 MED ORDER — HYDROMORPHONE HCL 1 MG/ML IJ SOLN
0.5000 mg | Freq: Once | INTRAMUSCULAR | Status: AC
  Administered 2021-10-24: 0.5 mg via INTRAVENOUS

## 2021-10-24 MED ORDER — HEPARIN 6000 UNIT IRRIGATION SOLUTION
Status: AC
  Filled 2021-10-24: qty 500

## 2021-10-24 MED ORDER — SODIUM CHLORIDE 0.9 % IV SOLN: INTRAVENOUS | Status: DC

## 2021-10-24 MED ORDER — ONDANSETRON HCL 4 MG/2ML IJ SOLN
INTRAMUSCULAR | Status: AC
  Filled 2021-10-24: qty 2

## 2021-10-24 MED ORDER — ATORVASTATIN CALCIUM 10 MG PO TABS
20.0000 mg | ORAL_TABLET | Freq: Every day | ORAL | Status: DC
  Administered 2021-10-24 – 2021-10-25 (×2): 20 mg via ORAL
  Filled 2021-10-24 (×2): qty 2

## 2021-10-24 MED ORDER — HEPARIN 6000 UNIT IRRIGATION SOLUTION
Status: DC | PRN
Start: 2021-10-24 — End: 2021-10-24
  Administered 2021-10-24: 1

## 2021-10-24 MED ORDER — LABETALOL HCL 5 MG/ML IV SOLN: 10.0000 mg | INTRAVENOUS | Status: DC | PRN

## 2021-10-24 MED ORDER — FENTANYL CITRATE (PF) 250 MCG/5ML IJ SOLN
INTRAMUSCULAR | Status: DC | PRN
  Administered 2021-10-24 (×3): 50 ug via INTRAVENOUS
  Administered 2021-10-24: 100 ug via INTRAVENOUS
  Administered 2021-10-24 (×2): 50 ug via INTRAVENOUS

## 2021-10-24 MED ORDER — HYDROMORPHONE HCL 1 MG/ML IJ SOLN
INTRAMUSCULAR | Status: AC
  Filled 2021-10-24: qty 1

## 2021-10-24 MED ORDER — POTASSIUM CHLORIDE CRYS ER 20 MEQ PO TBCR: 20.0000 meq | EXTENDED_RELEASE_TABLET | Freq: Every day | ORAL | Status: DC | PRN

## 2021-10-24 MED ORDER — PROTAMINE SULFATE 10 MG/ML IV SOLN
INTRAVENOUS | Status: DC | PRN
  Administered 2021-10-24: 20 mg via INTRAVENOUS
  Administered 2021-10-24: 10 mg via INTRAVENOUS
  Administered 2021-10-24: 20 mg via INTRAVENOUS

## 2021-10-24 MED ORDER — PANTOPRAZOLE SODIUM 40 MG PO TBEC
40.0000 mg | DELAYED_RELEASE_TABLET | Freq: Every day | ORAL | Status: DC
  Administered 2021-10-24 – 2021-10-27 (×4): 40 mg via ORAL
  Filled 2021-10-24 (×4): qty 1

## 2021-10-24 MED ORDER — ROCURONIUM BROMIDE 100 MG/10ML IV SOLN
INTRAVENOUS | Status: DC | PRN
Start: 2021-10-24 — End: 2021-10-24
  Administered 2021-10-24: 40 mg via INTRAVENOUS
  Administered 2021-10-24: 60 mg via INTRAVENOUS
  Administered 2021-10-24: 40 mg via INTRAVENOUS

## 2021-10-24 MED ORDER — MORPHINE SULFATE ER 15 MG PO TBCR
30.0000 mg | EXTENDED_RELEASE_TABLET | Freq: Every evening | ORAL | Status: DC
  Administered 2021-10-24 – 2021-10-26 (×3): 30 mg via ORAL
  Filled 2021-10-24 (×3): qty 2

## 2021-10-24 MED ORDER — ACETAMINOPHEN 325 MG PO TABS: 325.0000 mg | ORAL_TABLET | ORAL | Status: DC | PRN

## 2021-10-24 MED ORDER — CHLORHEXIDINE GLUCONATE 0.12 % MT SOLN
15.0000 mL | Freq: Once | OROMUCOSAL | Status: AC
  Administered 2021-10-24: 15 mL via OROMUCOSAL
  Filled 2021-10-24: qty 15

## 2021-10-24 MED ORDER — SUGAMMADEX SODIUM 200 MG/2ML IV SOLN
INTRAVENOUS | Status: DC | PRN
  Administered 2021-10-24: 200 mg via INTRAVENOUS

## 2021-10-24 MED ORDER — LIDOCAINE 2% (20 MG/ML) 5 ML SYRINGE
INTRAMUSCULAR | Status: AC
  Filled 2021-10-24: qty 5

## 2021-10-24 MED ORDER — LABETALOL HCL 5 MG/ML IV SOLN
INTRAVENOUS | Status: AC
  Filled 2021-10-24: qty 4

## 2021-10-24 MED ORDER — FENTANYL CITRATE (PF) 100 MCG/2ML IJ SOLN
INTRAMUSCULAR | Status: AC
  Filled 2021-10-24: qty 2

## 2021-10-24 MED ORDER — HEPARIN SODIUM (PORCINE) 5000 UNIT/ML IJ SOLN
5000.0000 [IU] | Freq: Three times a day (TID) | INTRAMUSCULAR | Status: DC
  Administered 2021-10-25 – 2021-10-27 (×7): 5000 [IU] via SUBCUTANEOUS
  Filled 2021-10-24 (×7): qty 1

## 2021-10-24 MED ORDER — ONDANSETRON HCL 4 MG/2ML IJ SOLN: 4.0000 mg | Freq: Four times a day (QID) | INTRAMUSCULAR | Status: DC | PRN

## 2021-10-24 MED ORDER — DOXYCYCLINE HYCLATE 100 MG PO TABS
100.0000 mg | ORAL_TABLET | Freq: Two times a day (BID) | ORAL | Status: DC
  Administered 2021-10-24 – 2021-10-27 (×6): 100 mg via ORAL
  Filled 2021-10-24 (×6): qty 1

## 2021-10-24 MED ORDER — SODIUM CHLORIDE 0.9 % IV SOLN: 500.0000 mL | Freq: Once | INTRAVENOUS | Status: DC | PRN

## 2021-10-24 MED ORDER — ACETAMINOPHEN 10 MG/ML IV SOLN
INTRAVENOUS | Status: AC
  Filled 2021-10-24: qty 100

## 2021-10-24 MED ORDER — GUAIFENESIN-DM 100-10 MG/5ML PO SYRP: 15.0000 mL | ORAL_SOLUTION | ORAL | Status: DC | PRN

## 2021-10-24 MED ORDER — LABETALOL HCL 5 MG/ML IV SOLN
INTRAVENOUS | Status: DC | PRN
  Administered 2021-10-24: 5 mg via INTRAVENOUS

## 2021-10-24 MED ORDER — FENTANYL CITRATE (PF) 100 MCG/2ML IJ SOLN
25.0000 ug | INTRAMUSCULAR | Status: DC | PRN
  Administered 2021-10-24 (×3): 50 ug via INTRAVENOUS

## 2021-10-24 MED ORDER — PHENOL 1.4 % MT LIQD: 1.0000 | OROMUCOSAL | Status: DC | PRN

## 2021-10-24 MED ORDER — ALUM & MAG HYDROXIDE-SIMETH 200-200-20 MG/5ML PO SUSP
15.0000 mL | ORAL | Status: DC | PRN
  Administered 2021-10-25: 30 mL via ORAL
  Filled 2021-10-24: qty 30

## 2021-10-24 MED ORDER — CEFAZOLIN SODIUM-DEXTROSE 2-4 GM/100ML-% IV SOLN
2.0000 g | INTRAVENOUS | Status: AC
  Administered 2021-10-24: 2 g via INTRAVENOUS
  Filled 2021-10-24: qty 100

## 2021-10-24 MED ORDER — PROPOFOL 10 MG/ML IV BOLUS
INTRAVENOUS | Status: AC
  Filled 2021-10-24: qty 20

## 2021-10-24 MED ORDER — ACETAMINOPHEN 10 MG/ML IV SOLN
1000.0000 mg | Freq: Once | INTRAVENOUS | Status: DC | PRN
  Administered 2021-10-24: 1000 mg via INTRAVENOUS

## 2021-10-24 MED ORDER — PROTAMINE SULFATE 10 MG/ML IV SOLN
INTRAVENOUS | Status: AC
  Filled 2021-10-24: qty 5

## 2021-10-24 MED ORDER — 0.9 % SODIUM CHLORIDE (POUR BTL) OPTIME
TOPICAL | Status: DC | PRN
  Administered 2021-10-24: 2000 mL

## 2021-10-24 MED ORDER — HEPARIN SODIUM (PORCINE) 1000 UNIT/ML IJ SOLN
INTRAMUSCULAR | Status: AC
  Filled 2021-10-24: qty 10

## 2021-10-24 MED ORDER — MORPHINE SULFATE (PF) 2 MG/ML IV SOLN
2.0000 mg | INTRAVENOUS | Status: DC | PRN
  Administered 2021-10-24 – 2021-10-25 (×8): 4 mg via INTRAVENOUS
  Administered 2021-10-25: 5 mg via INTRAVENOUS
  Administered 2021-10-25 – 2021-10-26 (×6): 4 mg via INTRAVENOUS
  Administered 2021-10-26: 5 mg via INTRAVENOUS
  Filled 2021-10-24 (×2): qty 2
  Filled 2021-10-24: qty 3
  Filled 2021-10-24 (×4): qty 2
  Filled 2021-10-24: qty 3
  Filled 2021-10-24 (×3): qty 2
  Filled 2021-10-24: qty 3
  Filled 2021-10-24 (×4): qty 2

## 2021-10-24 SURGICAL SUPPLY — 69 items
ADH SKN CLS APL DERMABOND .7 (GAUZE/BANDAGES/DRESSINGS) ×2
AGENT HMST SPONGE THK3/8 (HEMOSTASIS)
BAG COUNTER SPONGE SURGICOUNT (BAG) ×4 IMPLANT
BAG SPNG CNTER NS LX DISP (BAG) ×2
BANDAGE ESMARK 6X9 LF (GAUZE/BANDAGES/DRESSINGS) IMPLANT
BLADE CLIPPER SURG (BLADE) ×4 IMPLANT
BNDG CMPR 9X6 STRL LF SNTH (GAUZE/BANDAGES/DRESSINGS)
BNDG ESMARK 6X9 LF (GAUZE/BANDAGES/DRESSINGS)
CANISTER SUCT 3000ML PPV (MISCELLANEOUS) ×4 IMPLANT
CLIP FOGARTY SPRING 6M (CLIP) ×4 IMPLANT
CLIP VESOCCLUDE MED 24/CT (CLIP) ×4 IMPLANT
CLIP VESOCCLUDE SM WIDE 24/CT (CLIP) ×4 IMPLANT
COVER PROBE W GEL 5X96 (DRAPES) ×4 IMPLANT
CUFF TOURN SGL QUICK 24 (TOURNIQUET CUFF)
CUFF TOURN SGL QUICK 34 (TOURNIQUET CUFF)
CUFF TOURN SGL QUICK 42 (TOURNIQUET CUFF) IMPLANT
CUFF TRNQT CYL 24X4X16.5-23 (TOURNIQUET CUFF) IMPLANT
CUFF TRNQT CYL 34X4.125X (TOURNIQUET CUFF) IMPLANT
DERMABOND ADVANCED (GAUZE/BANDAGES/DRESSINGS) ×1
DERMABOND ADVANCED .7 DNX12 (GAUZE/BANDAGES/DRESSINGS) ×3 IMPLANT
DRAIN CHANNEL 15F RND FF W/TCR (WOUND CARE) IMPLANT
DRAPE C-ARM 42X72 X-RAY (DRAPES) ×4 IMPLANT
DRAPE HALF SHEET 40X57 (DRAPES) IMPLANT
ELECT REM PT RETURN 9FT ADLT (ELECTROSURGICAL) ×3
ELECTRODE REM PT RTRN 9FT ADLT (ELECTROSURGICAL) ×3 IMPLANT
EVACUATOR SILICONE 100CC (DRAIN) IMPLANT
GLOVE SRG 8 PF TXTR STRL LF DI (GLOVE) ×3 IMPLANT
GLOVE SURG ENC MOIS LTX SZ7.5 (GLOVE) ×4 IMPLANT
GLOVE SURG UNDER POLY LF SZ8 (GLOVE) ×3
GOWN STRL REUS W/ TWL LRG LVL3 (GOWN DISPOSABLE) ×6 IMPLANT
GOWN STRL REUS W/ TWL XL LVL3 (GOWN DISPOSABLE) ×6 IMPLANT
GOWN STRL REUS W/TWL LRG LVL3 (GOWN DISPOSABLE) ×6
GOWN STRL REUS W/TWL XL LVL3 (GOWN DISPOSABLE) ×6
GRAFT PROPATEN W/RING 6X80X60 (Vascular Products) ×1 IMPLANT
HEMOSTAT SPONGE AVITENE ULTRA (HEMOSTASIS) IMPLANT
INSERT FOGARTY SM (MISCELLANEOUS) IMPLANT
KIT BASIN OR (CUSTOM PROCEDURE TRAY) ×4 IMPLANT
KIT TURNOVER KIT B (KITS) ×4 IMPLANT
LOOP VESSEL MINI RED (MISCELLANEOUS) ×2 IMPLANT
NS IRRIG 1000ML POUR BTL (IV SOLUTION) ×8 IMPLANT
PACK PERIPHERAL VASCULAR (CUSTOM PROCEDURE TRAY) ×4 IMPLANT
PAD ARMBOARD 7.5X6 YLW CONV (MISCELLANEOUS) ×8 IMPLANT
PATCH VASC XENOSURE 1CMX6CM (Vascular Products) ×3 IMPLANT
PATCH VASC XENOSURE 1X6 (Vascular Products) IMPLANT
PENCIL BUTTON HOLSTER BLD 10FT (ELECTRODE) ×1 IMPLANT
SET MICROPUNCTURE 5F STIFF (MISCELLANEOUS) IMPLANT
SPONGE T-LAP 18X18 ~~LOC~~+RFID (SPONGE) ×1 IMPLANT
STOPCOCK 4 WAY LG BORE MALE ST (IV SETS) IMPLANT
SUT ETHILON 3 0 PS 1 (SUTURE) IMPLANT
SUT GORETEX 5 0 TT13 24 (SUTURE) IMPLANT
SUT GORETEX 6.0 TT13 (SUTURE) IMPLANT
SUT MNCRL AB 4-0 PS2 18 (SUTURE) ×12 IMPLANT
SUT PROLENE 5 0 C 1 24 (SUTURE) ×11 IMPLANT
SUT PROLENE 6 0 BV (SUTURE) ×6 IMPLANT
SUT PROLENE 7 0 BV 1 (SUTURE) IMPLANT
SUT SILK 2 0 PERMA HAND 18 BK (SUTURE) ×1 IMPLANT
SUT SILK 3 0 (SUTURE) ×3
SUT SILK 3-0 18XBRD TIE 12 (SUTURE) IMPLANT
SUT VIC AB 2-0 CT1 27 (SUTURE) ×6
SUT VIC AB 2-0 CT1 36 (SUTURE) ×1 IMPLANT
SUT VIC AB 2-0 CT1 TAPERPNT 27 (SUTURE) ×6 IMPLANT
SUT VIC AB 3-0 SH 27 (SUTURE) ×15
SUT VIC AB 3-0 SH 27X BRD (SUTURE) ×9 IMPLANT
TAPE UMBILICAL COTTON 1/8X30 (MISCELLANEOUS) IMPLANT
TOWEL GREEN STERILE (TOWEL DISPOSABLE) ×4 IMPLANT
TRAY FOLEY MTR SLVR 16FR STAT (SET/KITS/TRAYS/PACK) ×4 IMPLANT
TUBING EXTENTION W/L.L. (IV SETS) IMPLANT
UNDERPAD 30X36 HEAVY ABSORB (UNDERPADS AND DIAPERS) ×4 IMPLANT
WATER STERILE IRR 1000ML POUR (IV SOLUTION) ×4 IMPLANT

## 2021-10-24 NOTE — Anesthesia Procedure Notes (Signed)
Arterial Line Insertion Start/End1/08/2022 10:40 AM, 10/24/2021 10:45 AM Performed by: Myna Bright, CRNA, CRNA  Patient location: OR. Preanesthetic checklist: patient identified, IV checked, site marked, risks and benefits discussed, surgical consent, monitors and equipment checked and pre-op evaluation Patient sedated Left, radial was placed Catheter size: 20 G Hand hygiene performed  and maximum sterile barriers used  Allen's test indicative of satisfactory collateral circulation Attempts: 1 Procedure performed without using ultrasound guided technique. Following insertion, dressing applied and Biopatch. Post procedure assessment: normal  Patient tolerated the procedure well with no immediate complications.

## 2021-10-24 NOTE — Anesthesia Preprocedure Evaluation (Addendum)
Anesthesia Evaluation  Patient identified by MRN, date of birth, ID band Patient awake    Reviewed: Allergy & Precautions, NPO status , Patient's Chart, lab work & pertinent test results  Airway Mallampati: II  TM Distance: >3 FB     Dental  (+) Edentulous Upper, Edentulous Lower   Pulmonary COPD, Current Smoker,    Pulmonary exam normal        Cardiovascular hypertension, + Peripheral Vascular Disease   Rhythm:Regular Rate:Normal     Neuro/Psych  Headaches, Depression    GI/Hepatic negative GI ROS, Neg liver ROS,   Endo/Other  negative endocrine ROS  Renal/GU negative Renal ROS   Prostate Ca    Musculoskeletal  (+) Arthritis , Osteoarthritis,    Abdominal Normal abdominal exam  (+)   Peds  Hematology negative hematology ROS (+)   Anesthesia Other Findings   Reproductive/Obstetrics                           Anesthesia Physical Anesthesia Plan  ASA: 3  Anesthesia Plan: General   Post-op Pain Management:    Induction: Intravenous  PONV Risk Score and Plan: 1 and Ondansetron, Dexamethasone and Treatment may vary due to age or medical condition  Airway Management Planned: Mask and Oral ETT  Additional Equipment: Arterial line  Intra-op Plan:   Post-operative Plan: Extubation in OR  Informed Consent: I have reviewed the patients History and Physical, chart, labs and discussed the procedure including the risks, benefits and alternatives for the proposed anesthesia with the patient or authorized representative who has indicated his/her understanding and acceptance.     Dental advisory given  Plan Discussed with: CRNA  Anesthesia Plan Comments: (Lab Results      Component                Value               Date                      WBC                      6.7                 10/24/2021                HGB                      14.6                10/24/2021                HCT                       43.3                10/24/2021                MCV                      88.2                10/24/2021                PLT                      294  10/24/2021           Lab Results      Component                Value               Date                      NA                       135                 10/24/2021                K                        4.0                 10/24/2021                CO2                      22                  10/24/2021                GLUCOSE                  99                  10/24/2021                BUN                      13                  10/24/2021                CREATININE               0.89                10/24/2021                CALCIUM                  9.4                 10/24/2021                EGFR                     88                  06/22/2021                GFRNONAA                 >60                 10/24/2021          )       Anesthesia Quick Evaluation

## 2021-10-24 NOTE — Op Note (Signed)
Date: October 24, 2021  Preoperative diagnosis: Left lower extremity lifestyle limiting claudication  Postoperative diagnosis: Same  Procedure: 1.  Harvest of left leg great saphenous vein 2.  Left common femoral endarterectomy with profundoplasty and bovine pericardial patch angioplasty 3.  Left common femoral artery to below-knee popliteal artery bypass with 6 mm ringed PTFE graft  Surgeon: Dr. Marty Heck, MD  Assistant: Paulo Fruit, PA  Indications: Patient is a 66 year old male that was seen in the office last week with bilateral lower extremity lifestyle limiting claudication and worse in the left lower extremity.  He had ABIs of 0.4.  He underwent arteriogram that showed diffusely diseased SFA with a distal SFA above-knee popliteal occlusion in the left lower extremity with common femoral disease.  He presents today for left lower extremity bypass after risk benefits discussed.  He had no long segment saphenous vein on vein mapping and discussed high risk for prosthetic graft placement.  An assistant was needed for exposure and to expedite the case.   Findings: Initially mapped out the great saphenous vein in the left leg from the saphenofemoral junction to about the knee joint.  I elected to harvest this hoping that I had just enough vein to do bypass from the mid SFA to the below-knee popliteal artery but ultimately after this was harvested through two skip incisions and it branched in the mid to distal thigh and was not a long enough segment and became quite small as demonstrated on vein mapping.  The left common femoral artery was then exposed through a horizontal groin incision and the common femoral artery was circumferentially calcified with a very poor femoral pulse.  Endarterectomy was then performed of the common femoral with eversion endarterectomy of the SFA and profunda.  Bovine pericardial patch was sewn to the left common femoral artery.  Excellent femoral pulse at  completion.  A 6 mm ringed PTFE graft was then sewn from the left common femoral artery patch end to side to the below-knee popliteal artery end to side.  Palpable DP pulse at completion.  Anesthesia: General  EBL: 150 mL  Details: Patient was taken to the operating room after informed consent was obtained.  Placed on operative table in supine position.  General endotracheal anesthesia was induced.  I then used a ultrasound to evaluate the saphenous vein in the left leg and this looked to be of good caliber and from the saphenofemoral junction to about the mid thigh and then it became smaller.  I thought it was borderline usable down to the knee so I elected to try and harvested this to do a bypass from the SFA to the below-knee popliteal artery given vein graft would be better from an durability standpoint.  Two skip incisions were made on the left medial thigh and the great saphenous vein was harvested and all side branches were ligated between 3-0 silk ties and divided.  Ultimately once we got down to the mid to distal thigh it branched into three smaller branches and became quite small and not usable.  Ultimately I elected to come off the common femoral artery knowing I was going to have to use PTFE.  A horizontal groin incision was made in the left groin over the femoral pulse and dissected down with Bovie cautery using cerebellar retractors for added visualization.  The femoral sheath was opened longitudinally.  Dissected out the common femoral as well as SFA and profunda and all this was controlled with Vesseloops.  I then  went to the calf on the medial side of the leg and made a longitudinal incision dissected down through subcutaneous tissue and used Meyerding retractors and into the popliteal space.  The popliteal artery was then dissected away from the popliteal veins and Vesseloops were placed for proximal distal control.  I then used a large Gore tunneler and tunneled from the below-knee popliteal  artery up to the groin subfascial subsartorial plane.  A 6 mm ringed PTFE graft was brought on the field and this was tunneled making sure not to twist the graft.  Patient was given 100 units/kg IV heparin.  We checked an ACT to confirm it was greater than 250.  I used Vesseloops on SFA profunda and a Henley clamp on the distal external iliac.  The left common femoral was opened with 11 blade scalpel extended with Potts scissors and endarterectomy was performed with a Garment/textile technologist with eversion endarterectomy of the SFA and profunda.  We had excellent backbleeding from the profunda branches.  Small bovine pericardial patch was brought in the field and sewn 5-0 Prolene parachute technique and we de-aired the artery prior to completion.  There was an excellent femoral pulse at this time.  I did have to place several additional 5-0 Prolene repair sutures.  The graft had already been tunneled and then I used a Cooley clamp to control the common femoral patch this was opened with 11 blade scalpel Potts scissors the arteriotomy was extended.  I beveled the graft and an end to side anastomosis was sewn to the left bovine pericardial patch 6-0 Prolene parachute technique.  We had excellent pulsatile flow in the graft distally and reclamped the graft and flushed it with heparinized saline.  The graft was pulled to ensure there was one-to-one tension.  I then straighten the leg and cut the graft to the appropriate length.  Below-knee popliteal artery was controlled Vesseloops and this was opened 11 blade scalpel extended with Potts scissors and a end to side anastomosis was sewn to the left below-knee popliteal artery 6-0 Prolene parachute technique and we de-aired the artery prior to completion.  There was a excellent dorsalis pedis pulse in the foot at completion.  Patient was given protamine for reversal.  All the groin incisions and vein harvest incisions were irrigated out and then closed in multiple layers of 2-0  Vicryl, 3-0 Vicryl, 4-0 Monocryl and Dermabond.  Taken to recovery in stable condition.  Complication: None  Condition: Stable  Marty Heck, MD [facility] Office: [place]

## 2021-10-24 NOTE — Discharge Instructions (Signed)
 [facility]  Discharge instructions  Lower Extremity Bypass Surgery  Please refer to the following instruction for your post-procedure care. Your surgeon or physician assistant will discuss any changes with you.  Activity  You are encouraged to walk as much as you can. You can slowly return to normal activities during the month after your surgery. Avoid strenuous activity and heavy lifting until your doctor tells you it's OK. Avoid activities such as vacuuming or swinging a golf club. Do not drive until your doctor give the OK and you are no longer taking prescription pain medications. It is also normal to have difficulty with sleep habits, eating and bowel movement after surgery. These will go away with time.  Bathing/Showering  Shower daily after you go home. Do not soak in a bathtub, hot tub, or swim until the incision heals completely.  Incision Care  Clean your incision with mild soap and water. Shower every day. Pat the area dry with a clean towel. You do not need a bandage unless otherwise instructed. Do not apply any ointments or creams to your incision. If you have open wounds you will be instructed how to care for them or a visiting nurse may be arranged for you. If you have staples or sutures along your incision they will be removed at your post-op appointment. You may have skin glue on your incision. Do not peel it off. It will come off on its own in about one week.  Wash the groin wound with soap and water daily and pat dry. (No tub bath-only shower)  Then put a dry gauze or washcloth in the groin to keep this area dry to help prevent wound infection.  Do this daily and as needed.  Do not use Vaseline or neosporin on your incisions.  Only use soap and water on your incisions and then protect and keep dry.  Diet  Resume your normal diet. There are no special food restrictions following this procedure. A low fat/ low cholesterol diet is  recommended for all patients with vascular disease. In order to heal from your surgery, it is CRITICAL to get adequate nutrition. Your body requires vitamins, minerals, and protein. Vegetables are the best source of vitamins and minerals. Vegetables also provide the perfect balance of protein. Processed food has little nutritional value, so try to avoid this.  Medications  Resume taking all your medications unless your doctor or physician assistant tells you not to. If your incision is causing pain, you may take over-the-counter pain relievers such as acetaminophen (Tylenol). If you were prescribed a stronger pain medication, please aware these medication can cause nausea and constipation. Prevent nausea by taking the medication with a snack or meal. Avoid constipation by drinking plenty of fluids and eating foods with high amount of fiber, such as fruits, vegetables, and grains. Take Colace 100 mg (an over-the-counter stool softener) twice a day as needed for constipation.  Do not take Tylenol if you are taking prescription pain medications.  Follow Up  Our office will schedule a follow up appointment 2-3 weeks following discharge.  Please call us immediately for any of the following conditions  Severe or worsening pain in your legs or feet while at rest or while walking Increase pain, redness, warmth, or drainage (pus) from your incision site(s) Fever of 101 degree or higher The swelling in your leg with the bypass suddenly worsens and becomes more painful than when you were in the hospital If you have   been instructed to feel your graft pulse then you should do so every day. If you can no longer feel this pulse, call the office immediately. Not all patients are given this instruction.  Leg swelling is common after leg bypass surgery.  The swelling should improve over a few months following surgery. To improve the swelling, you may elevate your legs above the level of your heart while you are  sitting or resting. Your surgeon or physician assistant may ask you to apply an ACE wrap or wear compression (TED) stockings to help to reduce swelling.  Reduce your risk of vascular disease  Stop smoking. If you would like help call [facility] at 1-800-QUIT-NOW (1-800-784-8669) or [place] at 336-586-4000.  Manage your cholesterol Maintain a desired weight Control your diabetes weight Control your diabetes Keep your blood pressure down  If you have any questions, please call the office at [phone]  

## 2021-10-24 NOTE — Progress Notes (Signed)
Patient to room 4E21 from PACU. Vital signs obtained. On monitor CCMD notified. CHG bath completed. Incisions clean, dry, and intact. Alert and oriented to room and call light. Call bell within reach.  Era Bumpers, RN

## 2021-10-24 NOTE — Anesthesia Procedure Notes (Signed)
Procedure Name: Intubation Date/Time: 10/24/2021 10:43 AM Performed by: Darral Dash, DO Pre-anesthesia Checklist: Patient identified, Emergency Drugs available, Suction available and Patient being monitored Patient Re-evaluated:Patient Re-evaluated prior to induction Oxygen Delivery Method: Circle system utilized Preoxygenation: Pre-oxygenation with 100% oxygen Induction Type: IV induction Ventilation: Mask ventilation without difficulty and Oral airway inserted - appropriate to patient size Laryngoscope Size: Mac and 4 Grade View: Grade I Tube type: Oral Tube size: 7.5 mm Number of attempts: 1 Airway Equipment and Method: Stylet Placement Confirmation: ETT inserted through vocal cords under direct vision, positive ETCO2 and breath sounds checked- equal and bilateral Secured at: 22 cm Tube secured with: Tape Dental Injury: Teeth and Oropharynx as per pre-operative assessment

## 2021-10-24 NOTE — Evaluation (Signed)
Physical Therapy Evaluation Patient Details Name: Tyrone [name]. MRN: [id number] DOB: Nov 30, 1955 Today's Date: 10/24/2021  History of Present Illness  pt is a 66 y/o male admitted 1/11 with ligestyle limiting claudication in the L LE, pt s/p L fem to below knee popliteal BPG same day.  PMHx, COPD, HTN, prostate CA, tobacco abuse and severe PAD  Clinical Impression  Pt admitted with/for L LE BPG as described above.  Pt's evaluation was limited to moving toward EOB due to intensity of his L LE pain not touched by pain meds..  Pt currently limited functionally due to the problems listed below.  (see problems list.)  Pt will benefit from PT to maximize function and safety to be able to get home safely with available assist.        Recommendations for follow up therapy are one component of a multi-disciplinary discharge planning process, led by the attending physician.  Recommendations may be updated based on patient status, additional functional criteria and insurance authorization.  Follow Up Recommendations No PT follow up    Assistance Recommended at Discharge Frequent or constant Supervision/Assistance  Patient can return home with the following  A little help with walking and/or transfers;A little help with bathing/dressing/bathroom    Equipment Recommendations None recommended by PT  Recommendations for Other Services       Functional Status Assessment Patient has had a recent decline in their functional status and demonstrates the ability to make significant improvements in function in a reasonable and predictable amount of time.     Precautions / Restrictions Precautions Precautions: Fall (low risk with pain)      Mobility  Bed Mobility Overal bed mobility: Needs Assistance Bed Mobility: Supine to Sit     Supine to sit: Min assist     General bed mobility comments: pt up moving toward EOB when guarded L LE begun to hurt to much to continue.  Pt returned to original  position in bed with L LE assist only.    Transfers                   General transfer comment: NT due to pain in L LE too intense.    Ambulation/Gait                  Stairs            Wheelchair Mobility    Modified Rankin (Stroke Patients Only)       Balance Overall balance assessment: Needs assistance Sitting-balance support: Bilateral upper extremity supported;No upper extremity supported;Feet supported Sitting balance-Leahy Scale: Fair                                       Pertinent Vitals/Pain Pain Assessment: 0-10 Pain Score: 8  (with movement 8/10 at rest on Long acting PO meds) Pain Location: incisions Pain Descriptors / Indicators: Burning;Discomfort;Grimacing;Guarding;Sharp Pain Intervention(s): Limited activity within patient's tolerance;Monitored during session;Premedicated before session    Home Living Family/patient expects to be discharged to:: Private residence Living Arrangements: Spouse/significant other;Other relatives Available Help at Discharge: Family;Available 24 hours/day;Available PRN/intermittently Type of Home: House Home Access: Stairs to enter Entrance Stairs-Rails: None Entrance Stairs-Number of Steps: 1 Alternate Level Stairs-Number of Steps: flight Home Layout: Two level;1/2 bath on main level;Bed/bath upstairs Home Equipment: Rolling Walker (2 wheels);BSC/3in1      Prior Function Prior Level of Function : Independent/Modified Independent;Working/employed;Driving  Mobility Comments: No assistive device needed, but with claudication ADLs Comments: I ADL's     Hand Dominance        Extremity/Trunk Assessment   Upper Extremity Assessment Upper Extremity Assessment: Overall WFL for tasks assessed    Lower Extremity Assessment Lower Extremity Assessment: RLE deficits/detail;LLE deficits/detail RLE Deficits / Details: WFL LLE Deficits / Details: moves "gingerly" with  assist MMT limited LLE: Unable to fully assess due to pain LLE Coordination: decreased fine motor    Cervical / Trunk Assessment Cervical / Trunk Assessment: Normal  Communication   Communication: No difficulties  Cognition Arousal/Alertness: Awake/alert Behavior During Therapy: WFL for tasks assessed/performed Overall Cognitive Status: Within Functional Limits for tasks assessed                                          General Comments General comments (skin integrity, edema, etc.): vss overall    Exercises     Assessment/Plan    PT Assessment Patient needs continued PT services  PT Problem List Decreased activity tolerance;Decreased mobility;Decreased knowledge of use of DME;Decreased knowledge of precautions;Pain;Decreased strength       PT Treatment Interventions DME instruction;Gait training;Stair training;Functional mobility training;Therapeutic activities;Patient/family education    PT Goals (Current goals can be found in the Care Plan section)  Acute Rehab PT Goals Patient Stated Goal: home, independent and ultimately back to work PT Goal Formulation: With patient Time For Goal Achievement: 10/31/21 Potential to Achieve Goals: Good    Frequency Min 3X/week     Co-evaluation               AM-PAC PT "6 Clicks" Mobility  Outcome Measure Help needed turning from your back to your side while in a flat bed without using bedrails?: A Little Help needed moving from lying on your back to sitting on the side of a flat bed without using bedrails?: A Little Help needed moving to and from a bed to a chair (including a wheelchair)?: A Lot Help needed standing up from a chair using your arms (e.g., wheelchair or bedside chair)?: A Lot Help needed to walk in hospital room?: A Little Help needed climbing 3-5 steps with a railing? : A Little 6 Click Score: 16    End of Session   Activity Tolerance: Patient limited by pain Patient left: in bed;with  call bell/phone within reach;with family/visitor present Nurse Communication: Mobility status PT Visit Diagnosis: Other abnormalities of gait and mobility (R26.89);Pain Pain - Right/Left: Left Pain - part of body: Leg    Time: 2800-3491 PT Time Calculation (min) (ACUTE ONLY): 18 min   Charges:   PT Evaluation $PT Eval Low Complexity: 1 Low          10/24/2021  Ginger Carne., PT Acute Rehabilitation Services [phone]  (pager) [phone]  (office)  Tessie Fass Barbi [place] 10/24/2021, 7:05 PM

## 2021-10-24 NOTE — Transfer of Care (Signed)
Immediate Anesthesia Transfer of Care Note  Patient: Tyrone [name].  Procedure(s) Performed: LEFT COMMON FEMORAL- BELOW KNEE POPLITEAL BYPASS (Left) ENDARTERECTOMY FEMORAL WITH PROFUNDAPLASTY (Left: Leg Upper) PATCH ANGIOPLASTY (Left) VEIN HARVEST (Left: Leg Upper)  Patient Location: PACU  Anesthesia Type:General  Level of Consciousness: awake, alert , oriented and patient cooperative  Airway & Oxygen Therapy: Patient Spontanous Breathing and Patient connected to face mask oxygen  Post-op Assessment: Report given to RN, Post -op Vital signs reviewed and stable and Patient moving all extremities  Post vital signs: Reviewed and stable  Last Vitals:  Vitals Value Taken Time  BP 159/138 10/24/21 1409  Temp    Pulse 88 10/24/21 1410  Resp 19 10/24/21 1410  SpO2 96 % 10/24/21 1410  Vitals shown include unvalidated device data.  Last Pain:  Vitals:   10/24/21 0837  TempSrc:   PainSc: 5          Complications: No notable events documented.

## 2021-10-24 NOTE — H&P (Signed)
History and Physical Interval Note:  10/24/2021 8:44 AM  [name]  has presented today for surgery, with the diagnosis of Critical Limb Ischemia.  The various methods of treatment have been discussed with the patient and family. After consideration of risks, benefits and other options for treatment, the patient has consented to  Procedure(s): LEFT FEMORAL- BELOW KNEE BYPASS (Left) as a surgical intervention.  The patient's history has been reviewed, patient examined, no change in status, stable for surgery.  I have reviewed the patient's chart and labs.  Questions were answered to the patient's satisfaction.    66 year old male with lifestyle limiting claudication in the left lower extremity.  Recent arteriogram needs left fem below-knee popliteal bypass.  Discussed no long segment vein on vein mapping and likely will require prosthetic graft.  Discussed delaying this as long as possible given poor durability and high risk of infection.  ABI on the left is 0.4.  He does not feel like he can wait any longer.  Risk benefits again discussed.  Marty Heck  Patient name: Franky Reier.    MRN: [id number]        DOB: 11-04-1955          Sex: male   REASON FOR CONSULT: Evaluate severe PAD   HPI: Lamarcus Spira. is a 66 y.o. male, with history of COPD, hypertension, prostate cancer, tobacco abuse that presents for evaluation of severe PAD.  Patient describes years of bilateral lower extremity cramping in the calves when he walks.  This has gotten much more severe recently and now he is really unable to walk more than several hundred feet without stopping.  He states that recently he started getting numbness and pain in the feet at night that wakes him up as well.  He does feel the left leg is slightly worse than the right.  No previous interventions.  He did have ABIs by his PCP that showed ABI of 0.44 on the right and 0.40 on the left.       Past Medical History:  Diagnosis  Date   Arthritis      hands, knees, back   COPD (chronic obstructive pulmonary disease) ([place])     Depression     Diskitis 6/43/3295   Hardware complicating wound infection ([place]) 02/28/2021   Headache      migraines   Hypertension     Legionella pneumonia ([place]) 02/28/2021   Neuromuscular disorder ([place])      hands   Prostate cancer [facility])             Past Surgical History:  Procedure Laterality Date   APPENDECTOMY       BACK SURGERY   1980"s    L4 to L 5 laminectomy   COLONOSCOPY WITH PROPOFOL N/A 02/06/2016    Procedure: COLONOSCOPY WITH PROPOFOL;  Surgeon: Garlan Fair, MD;  Location: [facility];  Service: Endoscopy;  Laterality: N/A;   DIAGNOSTIC LAPAROSCOPY       LAPAROSCOPIC APPENDECTOMY N/A 08/13/2018    Procedure: APPENDECTOMY LAPAROSCOPIC;  Surgeon: Alphonsa Overall, MD;  Location: [facility];  Service: General;  Laterality: N/A;   PELVIC LYMPH NODE DISSECTION Bilateral 02/08/2019    Procedure: PELVIC LYMPH NODE DISSECTION;  Surgeon: Lucas Mallow, MD;  Location: [facility];  Service: Urology;  Laterality: Bilateral;   ROBOT ASSISTED LAPAROSCOPIC RADICAL PROSTATECTOMY N/A 02/08/2019    Procedure: XI ROBOTIC ASSISTED LAPAROSCOPIC RADICAL PROSTATECTOMY;  Surgeon: Lucas Mallow, MD;  Location: [facility];  Service: Urology;  Laterality: N/A;   TRANSFORAMINAL LUMBAR INTERBODY FUSION (TLIF) WITH PEDICLE SCREW FIXATION 1 LEVEL Left 09/12/2020    Procedure: LEFT-SIDED LUMBAR 1 - LUMBAR 2 TRANSFORAMINAL LUMBAR INTERBODY FUSION WITH INSTRUMENTATION AND ALLOGRAFT;  Surgeon: Phylliss Bob, MD;  Location: [facility];  Service: Orthopedics;  Laterality: Left;  3C BED   WRIST SURGERY Right      x 2            Family History  Problem Relation Age of Onset   Colon cancer Maternal Grandmother     Colon cancer Maternal Great-grandmother     Prostate cancer Neg Hx        SOCIAL HISTORY: Social History         Socioeconomic History   Marital status: Married      Spouse name: Not on file    Number of children: 6   Years of education: Not on file   Highest education level: Not on file  Occupational History   Occupation: [employer]  Tobacco Use   Smoking status: Every Day      Packs/day: 2.00      Years: 45.00      Pack years: 90.00      Types: Cigarettes   Smokeless tobacco: Never  Vaping Use   Vaping Use: Never used  Substance and Sexual Activity   Alcohol use: Yes      Comment: occasional   Drug use: No   Sexual activity: Not Currently  Other Topics Concern   Not on file  Social History Narrative    Married for 48 years with 3 sons and 3 daughters. Smokes a ppd.     Social Determinants of Health    Financial Resource Strain: Not on file  Food Insecurity: Not on file  Transportation Needs: Not on file  Physical Activity: Not on file  Stress: Not on file  Social Connections: Not on file  Intimate Partner Violence: Not on file           Allergies  Allergen Reactions   Neurontin [Gabapentin] Itching and Other (See Comments)      Confusion and sedation.            Current Outpatient Medications  Medication Sig Dispense Refill   cefdinir (OMNICEF) 300 MG capsule Take 1 capsule (300 mg total) by mouth 2 (two) times daily. 60 capsule 11   doxycycline (VIBRA-TABS) 100 MG tablet Take 1 tablet (100 mg total) by mouth 2 (two) times daily. 60 tablet 11   morphine (MS CONTIN) 30 MG 12 hr tablet Take 30 mg by mouth 2 (two) times daily as needed.       morphine (MSIR) 15 MG tablet Take 15 mg by mouth 4 (four) times daily as needed.       Multiple Vitamin (MULTIVITAMIN WITH MINERALS) TABS tablet Take 1 tablet by mouth daily.       oxyCODONE-acetaminophen (PERCOCET/ROXICET) 5-325 MG tablet Take 1-2 tablets by mouth every 4 (four) hours as needed for moderate pain or severe pain. (Patient not taking: No sig reported) 30 tablet 0   sildenafil (VIAGRA) 100 MG tablet TAKE ONE TABLET BY MOUTH AS NEEDED *DO NOT TAKE MEDICATION WITH YOUR INJECTIONS FOR ERECTILE DYSFUNCTION  AT THE SAME TIME*       tiZANidine (ZANAFLEX) 4 MG tablet Take 4 mg by mouth at bedtime.        No current facility-administered medications for this visit.  REVIEW OF SYSTEMS:  [X]  denotes positive finding, [ ]  denotes negative finding Cardiac   Comments:  Chest pain or chest pressure:      Shortness of breath upon exertion:      Short of breath when lying flat:      Irregular heart rhythm:             Vascular      Pain in calf, thigh, or hip brought on by ambulation: x    Pain in feet at night that wakes you up from your sleep:  x    Blood clot in your veins:      Leg swelling:              Pulmonary      Oxygen at home:      Productive cough:       Wheezing:              Neurologic      Sudden weakness in arms or legs:       Sudden numbness in arms or legs:       Sudden onset of difficulty speaking or slurred speech:      Temporary loss of vision in one eye:       Problems with dizziness:              Gastrointestinal      Blood in stool:       Vomited blood:              Genitourinary      Burning when urinating:       Blood in urine:             Psychiatric      Major depression:              Hematologic      Bleeding problems:      Problems with blood clotting too easily:             Skin      Rashes or ulcers:             Constitutional      Fever or chills:          PHYSICAL EXAM:    Vitals:    10/16/21 0823  BP: (!) 148/75  Pulse: 71  Resp: 18  Temp: 98.2 F (36.8 C)  TempSrc: Temporal  SpO2: 97%  Weight: 195 lb (88.5 kg)  Height: 5\' 10"  (1.778 m)      GENERAL: The patient is a well-nourished male, in no acute distress. The vital signs are documented above. CARDIAC: There is a regular rate and rhythm.  VASCULAR:  Palpable femoral pulses bilaterally No palpable popliteal pulses No palpable pedal pulses bilaterally No open wounds PULMONARY: No respiratory distress. ABDOMEN: Soft and non-tender. MUSCULOSKELETAL: There are no  major deformities or cyanosis. NEUROLOGIC: No focal weakness or paresthesias are detected. SKIN: There are no ulcers or rashes noted. PSYCHIATRIC: The patient has a normal affect.   DATA:    ABIs 09/25/21 were 0.44 on the right and 0.4 on the left   Assessment/Plan:   66 year old male presents with severe PAD in the bilateral lower extremities.  He has a classic history of bilateral lower extremity lifestyle limiting claudication and recently has had symptom progression that sounds consistent with rest pain slightly worse on the left.  I can appreciate femoral pulses bilaterally, although I do not feel any popliteal or pedal pulses.  I have recommended aortogram with bilateral lower extremity arteriogram and possible endovascular intervention.  We will get imaging of both lower extremities but initial focus for intervention will be the left leg.  Discussed if no endovascular options may ultimately require open surgical bypass.  Risk benefits discussed.  We will get him scheduled for Thursday in the cath lab.  He is on aspirin already and I talked about the importance of smoking cessation.     Marty Heck, MD [facility] [place] Office: [phone]

## 2021-10-25 ENCOUNTER — Encounter (HOSPITAL_COMMUNITY): Payer: Self-pay | Admitting: Vascular Surgery

## 2021-10-25 LAB — BASIC METABOLIC PANEL
Anion gap: 12 (ref 5–15)
BUN: 13 mg/dL (ref 8–23)
CO2: 26 mmol/L (ref 22–32)
Calcium: 8.6 mg/dL — ABNORMAL LOW (ref 8.9–10.3)
Chloride: 99 mmol/L (ref 98–111)
Creatinine, Ser: 0.8 mg/dL (ref 0.61–1.24)
GFR, Estimated: >60 mL/min (ref >60)
Glucose, Bld: 138 mg/dL — ABNORMAL HIGH (ref 70–99)
Potassium: 4.1 mmol/L (ref 3.5–5.1)
Sodium: 137 mmol/L (ref 135–145)

## 2021-10-25 LAB — LIPID PANEL
Cholesterol: 183 mg/dL (ref 0–200)
HDL: 50 mg/dL (ref >40)
LDL Cholesterol: 104 mg/dL — ABNORMAL HIGH (ref 0–99)
Total CHOL/HDL Ratio: 3.7 RATIO
Triglycerides: 146 mg/dL (ref <150)
VLDL: 29 mg/dL (ref 0–40)

## 2021-10-25 LAB — CBC
HCT: 33.2 % — ABNORMAL LOW (ref 39.0–52.0)
Hemoglobin: 11.2 g/dL — ABNORMAL LOW (ref 13.0–17.0)
MCH: 29.6 pg (ref 26.0–34.0)
MCHC: 33.7 g/dL (ref 30.0–36.0)
MCV: 87.6 fL (ref 80.0–100.0)
Platelets: 270 10*3/uL (ref 150–400)
RBC: 3.79 MIL/uL — ABNORMAL LOW (ref 4.22–5.81)
RDW: 13.3 % (ref 11.5–15.5)
WBC: 9.3 10*3/uL (ref 4.0–10.5)
nRBC: 0 % (ref 0.0–0.2)

## 2021-10-25 MED ORDER — MORPHINE SULFATE 15 MG PO TABS: 15.0000 mg | ORAL_TABLET | Freq: Two times a day (BID) | ORAL | Status: DC | PRN

## 2021-10-25 MED ORDER — OXYCODONE-ACETAMINOPHEN 5-325 MG PO TABS
1.0000 | ORAL_TABLET | ORAL | Status: DC | PRN
  Administered 2021-10-26 – 2021-10-27 (×5): 2 via ORAL
  Filled 2021-10-25 (×5): qty 2

## 2021-10-25 MED ORDER — MORPHINE SULFATE 15 MG PO TABS
15.0000 mg | ORAL_TABLET | Freq: Two times a day (BID) | ORAL | Status: DC | PRN
  Administered 2021-10-26 – 2021-10-27 (×3): 15 mg via ORAL
  Filled 2021-10-25 (×3): qty 1

## 2021-10-25 MED ORDER — ATORVASTATIN CALCIUM 40 MG PO TABS
40.0000 mg | ORAL_TABLET | Freq: Every day | ORAL | Status: DC
  Administered 2021-10-26 – 2021-10-27 (×2): 40 mg via ORAL
  Filled 2021-10-25 (×2): qty 1

## 2021-10-25 NOTE — Progress Notes (Addendum)
°  Progress Note    10/25/2021 9:00 AM 1 Day Post-Op  Subjective:  painful leg overnight   Vitals:   10/25/21 0100 10/25/21 0300  BP: 139/64 126/63  Pulse: 86 77  Resp: (!) 23 20  Temp:  98.5 F (36.9 C)  SpO2: 93% 93%   Physical Exam Lungs:  non labored Incisions:  L leg incisions well appearing Extremities:  2+ palpable L DP Neurologic: A&O  CBC    Component Value Date/Time   WBC 9.3 10/25/2021 0620   RBC 3.79 (L) 10/25/2021 0620   HGB 11.2 (L) 10/25/2021 0620   HCT 33.2 (L) 10/25/2021 0620   PLT 270 10/25/2021 0620   MCV 87.6 10/25/2021 0620   MCH 29.6 10/25/2021 0620   MCHC 33.7 10/25/2021 0620   RDW 13.3 10/25/2021 0620   LYMPHSABS 1,937 06/22/2021 1001   MONOABS 0.7 09/06/2020 0834   EOSABS 151 06/22/2021 1001   BASOSABS 72 06/22/2021 1001    BMET    Component Value Date/Time   NA 137 10/25/2021 0620   K 4.1 10/25/2021 0620   CL 99 10/25/2021 0620   CO2 26 10/25/2021 0620   GLUCOSE 138 (H) 10/25/2021 0620   BUN 13 10/25/2021 0620   CREATININE 0.80 10/25/2021 0620   CREATININE 0.96 06/22/2021 1001   CALCIUM 8.6 (L) 10/25/2021 0620   GFRNONAA >60 10/25/2021 0620   GFRNONAA 65 02/28/2021 0916   GFRAA 76 02/28/2021 0916    INR    Component Value Date/Time   INR 0.9 10/24/2021 0848     Intake/Output Summary (Last 24 hours) at 10/25/2021 0900 Last data filed at 10/25/2021 0357 Gross per 24 hour  Intake 2259.51 ml  Output 1200 ml  Net 1059.51 ml     Assessment/Plan:  66 y.o. male is s/p L CFA endarterectomy and bovine patch angioplasty with femoral to BK popliteal bypass with PTFE 1 Day Post-Op   L foot well perfused with palpable DP pulse Pain uncontrolled overnight; home pain med regimen was restarted OOB with therapy today Home tomorrow at the earliest if pain control improved   Dagoberto Ligas, PA-C [facility] [phone] 10/25/2021 9:00 AM  I have seen and evaluated the patient. I agree with the PA note as  documented above.  Postop day 1 status post left common femoral endarterectomy with profundoplasty bovine patch and a left common femoral to below-knee popliteal bypass with PTFE.  All of his incisions look great.  Left DP pulse palpable.  Postop labs look good.  Pain control is an ongoing issue given chronic pain.  We will adjust to his home medication regimen plus breakthrough and he gets MS Contin at night and we will add his short acting morphine oral during the day and Percocet for breakthrough.  PT/OT and mobilize today.  We will continue oral cefdinir and doxycycline for osteomyelitis of his spine..  Aspirin Lipitor ordered  Marty Heck, MD [facility] [place] Office: [phone]

## 2021-10-25 NOTE — Progress Notes (Signed)
PHARMACIST LIPID MONITORING   Tyrone [name]. is a 66 y.o. male admitted on 10/24/2021 with lower extremity claudication.  Pharmacy has been consulted to optimize lipid-lowering therapy with the indication of secondary prevention for clinical ASCVD.  Recent Labs:  Lipid Panel (last 6 months):   Lab Results  Component Value Date   CHOL 183 10/25/2021   TRIG 146 10/25/2021   HDL 50 10/25/2021   CHOLHDL 3.7 10/25/2021   VLDL 29 10/25/2021   LDLCALC 104 (H) 10/25/2021    Hepatic function panel (last 6 months):   Lab Results  Component Value Date   AST 23 10/24/2021   ALT 23 10/24/2021   ALKPHOS 77 10/24/2021   BILITOT 0.5 10/24/2021    SCr (since admission):   Serum creatinine: 0.8 mg/dL 10/25/21 0620 Estimated creatinine clearance: 103.1 mL/min  Current therapy and lipid therapy tolerance Current lipid-lowering therapy: atorvastatin 20mg  Previous lipid-lowering therapies (if applicable): n/a Documented or reported allergies or intolerances to lipid-lowering therapies (if applicable): n/a  Assessment:   Patient agrees with changes to lipid-lowering therapy  Plan:    1.Statin intensity (high intensity recommended for all patients regardless of the LDL):  Add or increase statin to high intensity.  2.Add ezetimibe (if any one of the following):   Not indicated at this time.  3.Refer to lipid clinic:   No  4.Follow-up with:  Primary care provider - Wenda Low, MD  5.Follow-up labs after discharge:  Changes in lipid therapy were made. Check a lipid panel in 8-12 weeks then annually.     Erin Hearing PharmD., BCPS Clinical Pharmacist 10/25/2021 9:11 AM

## 2021-10-25 NOTE — Plan of Care (Signed)
  Problem: Education: Goal: Knowledge of General Education information will improve Description Including pain rating scale, medication(s)/side effects and non-pharmacologic comfort measures Outcome: Progressing   

## 2021-10-25 NOTE — Anesthesia Postprocedure Evaluation (Signed)
Anesthesia Post Note  Patient: Tyrone [name].  Procedure(s) Performed: LEFT COMMON FEMORAL- BELOW KNEE POPLITEAL BYPASS (Left) ENDARTERECTOMY FEMORAL WITH PROFUNDAPLASTY (Left: Leg Upper) PATCH ANGIOPLASTY (Left) VEIN HARVEST (Left: Leg Upper)     Patient location during evaluation: PACU Anesthesia Type: General Level of consciousness: awake and alert Pain management: pain level controlled Vital Signs Assessment: post-procedure vital signs reviewed and stable Respiratory status: spontaneous breathing, nonlabored ventilation, respiratory function stable and patient connected to nasal cannula oxygen Cardiovascular status: blood pressure returned to baseline and stable Postop Assessment: no apparent nausea or vomiting Anesthetic complications: no   No notable events documented.  Last Vitals:  Vitals:   10/25/21 0100 10/25/21 0300  BP: 139/64 126/63  Pulse: 86 77  Resp: (!) 23 20  Temp:  36.9 C  SpO2: 93% 93%    Last Pain:  Vitals:   10/25/21 0626  TempSrc:   PainSc: 7                  Roselinda Bahena P Nashika Coker

## 2021-10-25 NOTE — Progress Notes (Signed)
Mobility Specialist: Progress Note   10/25/21 1527  Mobility  Activity Ambulated in hall  Level of Assistance Standby assist, set-up cues, supervision of patient - no hands on  Assistive Device Front wheel walker  Distance Ambulated (ft) 380 ft  Mobility Ambulated with assistance in hallway  Mobility Response Tolerated well  Mobility performed by Mobility specialist  $Mobility charge 1 Mobility   Pre-Mobility: 78 HR, 97% SpO2 Post-Mobility: 90 HR, 96% SpO2  Pt mod I with bed mobility as well as to stand. Standby assist during ambulation with c/o 7/10 pain, otherwise asymptomatic. Pt to BR, void successful, then back to bed with call bell and phone at his side. Family present in the room.   [facility] Yailine Ballard Mobility Specialist Mobility Specialist [address] [place]: [phone] Mobility Specialist 2 [place]: [phone]

## 2021-10-25 NOTE — Progress Notes (Signed)
Foley removed per MD order, pt tolerated activity well. Pt due to void by 2:42PM.

## 2021-10-25 NOTE — Progress Notes (Signed)
°  Transition of Care [facility]) Screening Note   Patient Details  Name: Tyrone [name]. Date of Birth: 04/14/1956   Transition of Care [facility]) CM/SW Contact:    Dawayne Patricia, RN Phone Number: 10/25/2021, 11:56 AM    Transition of Care Department [facility]) has reviewed patient and no TOC needs have been identified at this time. We will continue to monitor patient advancement through interdisciplinary progression rounds. If new patient transition needs arise, please place a TOC consult.

## 2021-10-25 NOTE — Evaluation (Signed)
Occupational Therapy Evaluation Patient Details Name: Tyrone [name]. MRN: [id number] DOB: October 23, 1955 Today's Date: 10/25/2021   History of Present Illness pt is a 66 y/o male admitted 1/11 with ligestyle limiting claudication in the L LE, pt s/p L fem to below knee popliteal BPG same day.  PMHx, COPD, HTN, prostate CA, tobacco abuse and severe PAD   Clinical Impression   PTA, pt lives with spouse and reports Independence with all daily tasks including full time work at a hotel. Pt presents now with chronic pain with addition of post op pain of L LE but able to mobilize fairly well. Pt Independent for UB ADLas and Min A at most for LB ADLs due to expected difficulty reaching B feet d/t pain. Pt benefits from RW to offload pressure of painful L LE to mobilize with min guard. Anticipate pt to progress well pending pain control and intermittent assist from wife as needed initially. Will continue to follow acutely though no skilled OT services needed at DC.       Recommendations for follow up therapy are one component of a multi-disciplinary discharge planning process, led by the attending physician.  Recommendations may be updated based on patient status, additional functional criteria and insurance authorization.   Follow Up Recommendations  No OT follow up    Assistance Recommended at Discharge Intermittent Supervision/Assistance  Patient can return home with the following A little help with walking and/or transfers;A little help with bathing/dressing/bathroom;Assistance with cooking/housework;Help with stairs or ramp for entrance;Assist for transportation    Functional Status Assessment  Patient has had a recent decline in their functional status and demonstrates the ability to make significant improvements in function in a reasonable and predictable amount of time.  Equipment Recommendations  Other (comment) (Rolling walker)    Recommendations for Other Services       Precautions /  Restrictions Precautions Precautions: Fall Restrictions Weight Bearing Restrictions: No      Mobility Bed Mobility Overal bed mobility: Needs Assistance Bed Mobility: Supine to Sit     Supine to sit: Supervision;HOB elevated     General bed mobility comments: increased time, able to manuever L LE without assist    Transfers Overall transfer level: Needs assistance Equipment used: Rolling walker (2 wheels) Transfers: Sit to/from Stand Sit to Stand: Supervision                  Balance Overall balance assessment: Needs assistance Sitting-balance support: No upper extremity supported;Feet supported Sitting balance-Leahy Scale: Good     Standing balance support: Single extremity supported;Bilateral upper extremity supported;During functional activity Standing balance-Leahy Scale: Fair Standing balance comment: able to stand without  support to don face mask but reliant on UE support for mobility to offload painful LE                           ADL either performed or assessed with clinical judgement   ADL Overall ADL's : Needs assistance/impaired Eating/Feeding: Independent   Grooming: Set up;Standing   Upper Body Bathing: Independent;Sitting   Lower Body Bathing: Minimal assistance;Sit to/from stand   Upper Body Dressing : Independent;Sitting   Lower Body Dressing: Minimal assistance;Sit to/from stand Lower Body Dressing Details (indicate cue type and reason): expected pain/difficulty reaching to lower L LE Toilet Transfer: Min guard;Ambulation;Rolling walker (2 wheels)   Toileting- Clothing Manipulation and Hygiene: Min guard;Sit to/from stand       Functional mobility during ADLs: Min guard;Rolling  walker (2 wheels) General ADL Comments: Pt with chronic pain with expected post op pain of L LE though able to move fairly well with pain med coordination. Educated on compensatory strategies for LB ADLs with wife assist at needed.     Vision  Baseline Vision/History: 1 Wears glasses Ability to See in Adequate Light: 0 Adequate Patient Visual Report: No change from baseline Vision Assessment?: No apparent visual deficits     Perception     Praxis      Pertinent Vitals/Pain Pain Assessment: Faces Faces Pain Scale: Hurts even more Pain Location: L LE Pain Descriptors / Indicators: Burning;Discomfort;Grimacing;Guarding;Sharp Pain Intervention(s): Monitored during session;Limited activity within patient's tolerance     Hand Dominance Right   Extremity/Trunk Assessment Upper Extremity Assessment Upper Extremity Assessment: Overall WFL for tasks assessed   Lower Extremity Assessment Lower Extremity Assessment: Defer to PT evaluation   Cervical / Trunk Assessment Cervical / Trunk Assessment: Normal   Communication Communication Communication: No difficulties   Cognition Arousal/Alertness: Awake/alert Behavior During Therapy: WFL for tasks assessed/performed Overall Cognitive Status: Within Functional Limits for tasks assessed                                       General Comments  VSS    Exercises     Shoulder Instructions      Home Living Family/patient expects to be discharged to:: Private residence Living Arrangements: Spouse/significant other;Other relatives Available Help at Discharge: Family;Available 24 hours/day;Available PRN/intermittently Type of Home: House Home Access: Stairs to enter Entrance Stairs-Number of Steps: 1 Entrance Stairs-Rails: None Home Layout: Two level;1/2 bath on main level;Bed/bath upstairs Alternate Level Stairs-Number of Steps: flight Alternate Level Stairs-Rails: Right;Left Bathroom Shower/Tub: Occupational psychologist: Standard     Home Equipment: BSC/3in1;Rollator (4 wheels);Shower seat          Prior Functioning/Environment Prior Level of Function : Independent/Modified Independent;Working/employed;Driving             Mobility  Comments: No assistive device needed, but pain limited; began avoiding stairs due to pain ADLs Comments: Works at hotel (involves a lot of walking); Independent with ADLs/IADLs        OT Problem List: Decreased activity tolerance;Impaired balance (sitting and/or standing);Pain      OT Treatment/Interventions: Self-care/ADL training;Therapeutic exercise;Energy conservation;DME and/or AE instruction;Therapeutic activities;Patient/family education;Balance training    OT Goals(Current goals can be found in the care plan section) Acute Rehab OT Goals Patient Stated Goal: decrease pain OT Goal Formulation: With patient Time For Goal Achievement: 11/08/21 Potential to Achieve Goals: Good ADL Goals Pt Will Perform Lower Body Dressing: with modified independence;sit to/from stand;with adaptive equipment Pt Will Transfer to Toilet: with modified independence;ambulating Additional ADL Goal #1: Pt to demo ability to gather 3 ADL items with MOD I using most appropriate DME  OT Frequency: Min 2X/week    Co-evaluation PT/OT/SLP Co-Evaluation/Treatment: Yes Reason for Co-Treatment: Other (comment) (pain limited)   OT goals addressed during session: ADL's and self-care      AM-PAC OT "6 Clicks" Daily Activity     Outcome Measure Help from another person eating meals?: None Help from another person taking care of personal grooming?: A Little Help from another person toileting, which includes using toliet, bedpan, or urinal?: A Little Help from another person bathing (including washing, rinsing, drying)?: A Little Help from another person to put on and taking off regular upper body  clothing?: None Help from another person to put on and taking off regular lower body clothing?: A Little 6 Click Score: 20   End of Session Equipment Utilized During Treatment: Rolling walker (2 wheels);Gait belt Nurse Communication: Mobility status  Activity Tolerance: Patient tolerated treatment well;Patient  limited by pain Patient left: Other (comment) (ambulating in hallway with PT)  OT Visit Diagnosis: Other abnormalities of gait and mobility (R26.89);Pain Pain - Right/Left: Left Pain - part of body: Leg                Time: 6269-4854 OT Time Calculation (min): 18 min Charges:  OT General Charges $OT Visit: 1 Visit OT Evaluation $OT Eval Low Complexity: 1 Low  Malachy Chamber, OTR/L Acute Rehab Services Office: [phone]   Layla Maw 10/25/2021, 10:08 AM

## 2021-10-25 NOTE — Progress Notes (Signed)
Physical Therapy Treatment Patient Details Name: Tyrone [name]. MRN: [id number] DOB: 1956-01-25 Today's Date: 10/25/2021   History of Present Illness Pt is a 66 y.o. male with h/o LLE claudication, admitted 10/24/21 for L femoral to below knee popliteal bypass graft. PMH includes COPD, HTN, prostate CA, PAD, tobacco use, chronic pain.   PT Comments    Pt progressing with mobility, reports somewhat improved pain compared to yesterday's evaluation. Today's session focused on transfer and gait training with RW; pt moving well with supervision despite significant pain. Encouraged more frequent OOB activity with nursing and mobility specialists. Will continue to follow acutely to address established goals.    Recommendations for follow up therapy are one component of a multi-disciplinary discharge planning process, led by the attending physician.  Recommendations may be updated based on patient status, additional functional criteria and insurance authorization.  Follow Up Recommendations  No PT follow up     Assistance Recommended at Discharge Intermittent Supervision/Assistance  Patient can return home with the following A little help with bathing/dressing/bathroom;Assistance with cooking/housework;Assist for transportation;Help with stairs or ramp for entrance   Equipment Recommendations  Rolling walker (2 wheels)    Recommendations for Other Services       Precautions / Restrictions Precautions Precautions: Fall Restrictions Weight Bearing Restrictions: No     Mobility  Bed Mobility Overal bed mobility: Modified Independent Bed Mobility: Supine to Sit     Supine to sit: Supervision;HOB elevated     General bed mobility comments: increased time, able to manuever L LE without assist    Transfers Overall transfer level: Needs assistance Equipment used: Rolling walker (2 wheels) Transfers: Sit to/from Stand Sit to Stand: Supervision           General transfer  comment: Able to stand from raised bed and low recliner height to RW with supervision for safety; good eccentric lowering into recliner    Ambulation/Gait Ambulation/Gait assistance: Min guard;Supervision Gait Distance (Feet): 80 Feet Assistive device: Rolling walker (2 wheels) Gait Pattern/deviations: Step-to pattern;Trunk flexed;Decreased weight shift to left;Antalgic Gait velocity: Decreased     General Gait Details: Slow, antalgic gait with RW and initial min guard, progressing to supervision for safety/lines   Stairs             Wheelchair Mobility    Modified Rankin (Stroke Patients Only)       Balance Overall balance assessment: Needs assistance Sitting-balance support: No upper extremity supported;Feet supported Sitting balance-Leahy Scale: Good     Standing balance support: Single extremity supported;Bilateral upper extremity supported;During functional activity Standing balance-Leahy Scale: Fair Standing balance comment: able to stand without  support to don face mask but reliant on UE support for mobility to offload painful LE                            Cognition Arousal/Alertness: Awake/alert Behavior During Therapy: WFL for tasks assessed/performed Overall Cognitive Status: Within Functional Limits for tasks assessed                                          Exercises      General Comments General comments (skin integrity, edema, etc.): RN present to monitor pt after receiving IV pain med, pt tolerated well      Pertinent Vitals/Pain Pain Assessment: Faces Faces Pain Scale: Hurts even more Pain Location:  L LE Pain Descriptors / Indicators: Burning;Discomfort;Grimacing;Guarding;Sharp Pain Intervention(s): Monitored during session;Limited activity within patient's tolerance;RN gave pain meds during session (IV morphine)    Home Living Family/patient expects to be discharged to:: Private residence Living Arrangements:  Spouse/significant other;Other relatives Available Help at Discharge: Family;Available 24 hours/day;Available PRN/intermittently Type of Home: House Home Access: Stairs to enter Entrance Stairs-Rails: None Entrance Stairs-Number of Steps: 1 Alternate Level Stairs-Number of Steps: flight Home Layout: Two level;1/2 bath on main level;Bed/bath upstairs Home Equipment: BSC/3in1;Rollator (4 wheels);Shower seat      Prior Function            PT Goals (current goals can now be found in the care plan section) Progress towards PT goals: Progressing toward goals    Frequency    Min 3X/week      PT Plan Current plan remains appropriate    Co-evaluation   Reason for Co-Treatment: Other (comment) (pain limited)   OT goals addressed during session: ADL's and self-care      AM-PAC PT "6 Clicks" Mobility   Outcome Measure  Help needed turning from your back to your side while in a flat bed without using bedrails?: None Help needed moving from lying on your back to sitting on the side of a flat bed without using bedrails?: A Little Help needed moving to and from a bed to a chair (including a wheelchair)?: A Little Help needed standing up from a chair using your arms (e.g., wheelchair or bedside chair)?: A Little Help needed to walk in hospital room?: A Little Help needed climbing 3-5 steps with a railing? : A Little 6 Click Score: 19    End of Session Equipment Utilized During Treatment: Gait belt Activity Tolerance: Patient tolerated treatment well Patient left: in chair;with call bell/phone within reach;with chair alarm set Nurse Communication: Mobility status;Other (comment) (no chair alarm box in room) PT Visit Diagnosis: Other abnormalities of gait and mobility (R26.89);Pain Pain - Right/Left: Left     Time: 6073-7106 PT Time Calculation (min) (ACUTE ONLY): 26 min  Charges:  $Gait Training: 8-22 mins                     Mabeline Caras, PT, DPT Acute Rehabilitation  Services  Pager [phone] Office [place] 10/25/2021, 12:07 PM

## 2021-10-26 MED ORDER — POLYETHYLENE GLYCOL 3350 17 G PO PACK
17.0000 g | PACK | Freq: Every day | ORAL | Status: DC | PRN
  Administered 2021-10-26: 17 g via ORAL
  Filled 2021-10-26: qty 1

## 2021-10-26 NOTE — Progress Notes (Addendum)
[facility]  Subjective  - Still having significant pain with history of chronic pain.   Objective (!) 129/47 71 98.3 F (36.8 C) (Oral) 15 93%  Intake/Output Summary (Last 24 hours) at 10/26/2021 0732 Last data filed at 10/26/2021 0404 Gross per 24 hour  Intake 700 ml  Output 875 ml  Net -175 ml    Palpable L DP Left incisions healing well without erythema, groin is soft without hematoma Lungs non labored breathing General no acute distress    Assessment/Planning: POD # 2 66 y.o. [name] is s/p L CFA endarterectomy and bovine patch angioplasty with femoral to BK popliteal bypass with PTFE 1  Well perfused with palpable pedal pulses Still having pain issues Pending pain control and mobility     Roxy Horseman 10/26/2021 7:32 AM --  Laboratory Lab Results: Recent Labs    10/24/21 0848 10/25/21 0620  WBC 6.7 9.3  HGB 14.6 11.2*  HCT 43.3 33.2*  PLT 294 270   BMET Recent Labs    10/24/21 0848 10/25/21 0620  NA 135 137  K 4.0 4.1  CL 99 99  CO2 22 Tyrone  GLUCOSE 99 138*  BUN 13 13  CREATININE 0.89 0.80  CALCIUM 9.4 8.6*    COAG Lab Results  Component Value Date   INR 0.9 10/24/2021   INR 0.9 09/06/2020   INR 0.9 02/01/2019   No results found for: PTT  I have seen and evaluated the patient. I agree with the PA note as documented above.  Postop day 2 status post left common femoral endarterectomy with profundoplasty and bovine pericardial patch and then a left common femoral to below-knee popliteal bypass with PTFE.  Incisions look excellent.  Palpable DP pulse in the foot.  Walked in the hall yesterday.  Pain control is an ongoing issue and has chronic narcotic requirement at home.  We have ordered his long-acting MS Contin at night with his short acting PO morphine during the day and also Percocet for breakthrough.  Hopefully if mobility improves he can go home tomorrow.  Vital stable. Aspirin statin.  Marty Heck, MD [facility] [place] Office: [phone]

## 2021-10-26 NOTE — Progress Notes (Signed)
Mobility Specialist: Progress Note   10/26/21 1319  Mobility  Activity Ambulated in hall  Level of Assistance Modified independent, requires aide device or extra time  Assistive Device Front wheel walker  Distance Ambulated (ft) 850 ft  Mobility Ambulated with assistance in hallway  Mobility Response Tolerated well  Mobility performed by Mobility specialist  $Mobility charge 1 Mobility   Post-Mobility: 95 HR  Pt c/o initial pain in LLE during ambulation which he said eased up with distance, otherwise asymptomatic. At end of ambulation pt rates LLE pain 6-7/10. Pt sitting EOB after walk with call bell and phone at his side.   [facility] Cassell [place] Mobility Specialist Mobility Specialist [address] [place]: [phone] Mobility Specialist [address] [place] and [place]: [phone]

## 2021-10-27 ENCOUNTER — Encounter: Payer: Self-pay | Admitting: Vascular Surgery

## 2021-10-27 MED ORDER — OXYCODONE-ACETAMINOPHEN 5-325 MG PO TABS: 1.0000 | ORAL_TABLET | ORAL | 0 refills | Status: DC | PRN

## 2021-10-27 MED ORDER — ATORVASTATIN CALCIUM 40 MG PO TABS: 40.0000 mg | ORAL_TABLET | Freq: Every day | ORAL | 3 refills | Status: DC

## 2021-10-27 NOTE — Progress Notes (Signed)
D/c tele and IV. Went over AVS with pt and all questions were addressed.   Axavier Pressley S Bricia Taher, RN  

## 2021-10-27 NOTE — Discharge Summary (Signed)
Physician Discharge Summary   Patient ID: Tyrone [name] [id number] 66 y.o. Oct 14, 1956  Admit date: 10/24/2021  Discharge date and time:  10/27/21  Admitting Physician: Marty Heck, MD   Discharge Physician: Cherre Robins, MD  Admission Diagnoses: PAD (peripheral artery disease) [facility]) [I73.9]  Discharge Diagnoses: same  Admission Condition: good  Discharged Condition: good  Indication for Admission: Tyrone Leitzel. is a 66 y.o. male, with history of COPD, hypertension, prostate cancer, tobacco abuse that presents for evaluation of severe PAD.  Patient describes years of bilateral lower extremity cramping in the calves when he walks.  This has gotten much more severe recently and now he is really unable to walk more than several hundred feet without stopping.  He states that recently he started getting numbness and pain in the feet at night that wakes him up as well.  He does feel the left leg is slightly worse than the right.  No previous interventions.  He did have ABIs by his PCP that showed ABI of 0.44 on the right and 0.40 on the left.  He was diagnosedwith severe PAD in the bilateral lower extremities.  He has a classic history of bilateral lower extremity lifestyle limiting claudication and recently has had symptom progression that sounds consistent with rest pain slightly worse on the left.  I can appreciate femoral pulses bilaterally, although I do not feel any popliteal or pedal pulses.  I have recommended aortogram with bilateral lower extremity arteriogram and possible endovascular intervention.  We will get imaging of both lower extremities but initial focus for intervention will be the left leg.  Discussed if no endovascular options may ultimately require open surgical bypass.  Risk benefits discussed.  We will get him scheduled for Thursday in the cath lab.  He is on aspirin already and I talked about the importance of smoking cessation.  Preoperative  angiogram was performed 10/18/2021.  He had a long segment femoral-popliteal occlusion and required left common femoral to below-knee popliteal artery bypass.  Unfortunately vein mapping showed no adequate vein for conduit.  The patient was counseled extensively about the limited durability of a prosthetic bypass.  He was adamant about undergoing intervention because of his disabling symptoms.  Hospital Course:  On 10/24/2021, the patient underwent left common femoral artery endarterectomy and profundoplasty with bovine pericardial patch angioplasty; and a left common femoral artery to below-knee popliteal artery bypass with 6 mm ringed PTFE.  Greater saphenous vein was explored, and found to be of inadequate quality.  Patient tolerated procedure very well.  He mobilized well postoperative day 1.  Pain control is a chronic issue for him, but on the morning of postoperative day 2 he felt well.  He is freely ambulatory.  He is tolerating a diet.  He is making urine.  I felt it safe for him to discharge today.  Consults: None  Significant Diagnostic Studies: none  Treatments: see above - surgery  Discharge Exam: BP 108/63 (BP Location: Left Arm)    Pulse 69    Temp (!) 97.5 F (36.4 C) (Oral)    Resp 20    Ht 5\' 10"  (1.778 m)    Wt 88.5 kg    SpO2 99%    BMI 27.99 kg/m  Constitutional: well appearing in no distress. Appears well nourished.  Neurologic: CN intact. no focal findings. no sensory loss. Psychiatric: Mood and affect symmetric and appropriate. Eyes: No icterus. No conjunctival pallor. Ears, nose, throat: mucous membranes moist.  Midline trachea.  Cardiac: regular rate and rhythm.  Respiratory: unlabored. Abdominal: soft, non-tender, non-distended.  Peripheral vascular:  Left lower extremity incisions clean dry and intact.  2+ dorsalis pedis pulse.  Brisk posterior tibial Doppler signal. Extremity: No edema. No cyanosis. No pallor.  Skin: No gangrene. No ulceration.  Lymphatic: No  Stemmer's sign. No palpable lymphadenopathy.   Disposition: Discharge disposition: 01-Home or Self Care       Patient Instructions:  Allergies as of 10/27/2021       Reactions   Neurontin [gabapentin] Itching, Other (See Comments)   Confusion and sedation.        Medication List     TAKE these medications    aspirin 325 MG tablet Take 325 mg by mouth every evening.   atorvastatin 40 MG tablet Commonly known as: LIPITOR Take 1 tablet (40 mg total) by mouth daily. Start taking on: October 28, 2021   cefdinir 300 MG capsule Commonly known as: OMNICEF Take 1 capsule (300 mg total) by mouth 2 (two) times daily.   doxycycline 100 MG tablet Commonly known as: VIBRA-TABS Take 1 tablet (100 mg total) by mouth 2 (two) times daily.   morphine 15 MG tablet Commonly known as: MSIR Take 15 mg by mouth 2 (two) times daily as needed (PAIN.).   morphine 30 MG 12 hr tablet Commonly known as: MS CONTIN Take 30 mg by mouth every evening.   multivitamin with minerals Tabs tablet Take 1 tablet by mouth every evening. Centrum Silver   oxyCODONE-acetaminophen 5-325 MG tablet Commonly known as: PERCOCET/ROXICET Take 1-2 tablets by mouth every 4 (four) hours as needed (breakthrough pain).               Discharge Care Instructions  (From admission, onward)           Start     Ordered   10/27/21 0000  Discharge wound care:       Comments: Keep clean dry bandage over groin incision.  Change daily, or as needed if the bandage becomes saturated or dirty.   10/27/21 0955           Activity: activity as tolerated and no heavy lifting for 4 weeks Diet: cardiac diet Wound Care: keep wound clean and dry and keep clean gauze bandage over groin incision.  Change daily or as needed.  Follow-up with Dr. Carlis Abbott in 4 weeks with ABI and left lower extremity duplex.    SignedCherre Robins 10/27/2021 9:55 AM

## 2021-10-28 LAB — TYPE AND SCREEN
ABO/RH(D): O POS
Antibody Screen: POSITIVE
Unit division: 0
Unit division: 0

## 2021-10-28 LAB — BPAM RBC
Blood Product Expiration Date: 202302032359
Blood Product Expiration Date: 202302032359
ISSUE DATE / TIME: 202301110202
Unit Type and Rh: 5100
Unit Type and Rh: 5100

## 2021-10-31 ENCOUNTER — Ambulatory Visit: Payer: 59 | Admitting: Infectious Disease

## 2021-11-05 ENCOUNTER — Encounter: Payer: Self-pay | Admitting: Infectious Disease

## 2021-11-05 ENCOUNTER — Ambulatory Visit (INDEPENDENT_AMBULATORY_CARE_PROVIDER_SITE_OTHER): Payer: 59

## 2021-11-05 ENCOUNTER — Ambulatory Visit (INDEPENDENT_AMBULATORY_CARE_PROVIDER_SITE_OTHER): Payer: 59 | Admitting: Infectious Disease

## 2021-11-05 ENCOUNTER — Other Ambulatory Visit: Payer: Self-pay

## 2021-11-05 VITALS — BP 137/77 | HR 99 | Temp 97.7°F | Wt 197.0 lb

## 2021-11-05 DIAGNOSIS — I739 Peripheral vascular disease, unspecified: Secondary | ICD-10-CM

## 2021-11-05 DIAGNOSIS — M4646 Discitis, unspecified, lumbar region: Secondary | ICD-10-CM | POA: Diagnosis not present

## 2021-11-05 DIAGNOSIS — Z23 Encounter for immunization: Secondary | ICD-10-CM

## 2021-11-05 DIAGNOSIS — T847XXD Infection and inflammatory reaction due to other internal orthopedic prosthetic devices, implants and grafts, subsequent encounter: Secondary | ICD-10-CM

## 2021-11-05 DIAGNOSIS — F17200 Nicotine dependence, unspecified, uncomplicated: Secondary | ICD-10-CM

## 2021-11-05 DIAGNOSIS — Z7185 Encounter for immunization safety counseling: Secondary | ICD-10-CM

## 2021-11-05 HISTORY — DX: Encounter for immunization safety counseling: Z71.85

## 2021-11-05 HISTORY — DX: Nicotine dependence, unspecified, uncomplicated: F17.200

## 2021-11-05 MED ORDER — DOXYCYCLINE HYCLATE 100 MG PO TABS: 100.0000 mg | ORAL_TABLET | Freq: Two times a day (BID) | ORAL | 11 refills | Status: DC

## 2021-11-05 MED ORDER — CEFDINIR 300 MG PO CAPS: 300.0000 mg | ORAL_CAPSULE | Freq: Two times a day (BID) | ORAL | 11 refills | Status: DC

## 2021-11-05 NOTE — Progress Notes (Signed)
° ° °  [id number]  Name:  Tyrone [name].    MRN: [id number] DOB: 1956/05/08  11/05/2021  [name] was observed post Covid-19 immunization for 15 minutes without incident. He was provided with Vaccine Information Sheet and instruction to access the V-Safe system.   [name] was instructed to call 911 with any severe reactions post vaccine: Difficulty breathing  Swelling of face and throat  A fast heartbeat  A bad rash all over body  Dizziness and weakness   Immunizations Administered     Name Date Dose VIS Date Route   Pfizer Covid-19 Vaccine Bivalent Booster 11/05/2021  3:30 PM 0.3 mL 06/13/2021 Intramuscular   Manufacturer: [place]   Lot: [id number]   [place]: [phone]

## 2021-11-05 NOTE — Progress Notes (Signed)
Subjective:   Chief complaint: Postoperative pain in his left leg where he had bypass surgery.      Patient ID: Tyrone [name]., male    DOB: 12-Nov-1955, 66 y.o.   MRN: [id number]  HPI  Tyrone [name] is a 66 year old Caucasian man with a past medical history significant for COPD depression, hypertension, prostate cancer who had surgery done on his spine by Dr. Lynann Bologna on September 12, 2020.  Surgery was done to address patient's left lumbar radiculopathy and a migrated L1-L2 herniated nucleus pulposis which was causing severe compression of the left L2 nerve.  He performed a complex L1-L2 decompression with removal of extensive adherent L1-L2 disc material left-sided L1-L2 transforaminal lumbar interbody fusion, right-sided L1-L2 posterior lateral fusion interbody device intervertebral spacer segment and posterior segmentation L1-L2 bilaterally allograft placement and morselized allograft.  Unfortunately December the patient developed increasingly severe low back pain.  The patient is the pain much lower down actually than the site that now has been identified on MRI is possible the being infected.  He has tried various interventions including physical therapy and injection into the sacroiliac joint in March and is still needing opiates to control pain.   The pain is present always but worst when he sits down or stands up.  He can lie down but he has to get a specific position in bed for the pain to abate.  Is a fairly sharp intense pain with some spasm components.  It does not radiate down his legs.  Dr. Lynann Bologna ordered an MRI which was performed on Feb 19, 2021.  The films reveal some edema that is fairly extensive in the L1 and L2 vertebral bodies with a small amount of fluid anterior to the disc base.  They felt this could Be consistent with infection but they were fairly equivocal in the read of the scan.  They mentioned an epidural fluid collection that had really resolved in the  meantime.  Dr. Lynann Bologna I was able to have interventional radiology aspirate one of the vertebral bodies for culture.  We endeavored to get him onto daptomycin and ceftriaxone but this was cost prohibitive, then we tried vancomycin and ceftriaxone but was similarly cost prohibitive ultimately ended up treating him with ceftriaxone parenterally with oral doxycycline.  He completed IV antibiotics and was placed him on cefdinir and doxycycline.  He is continue to have improvement in his pain and is returned to work.  He is visibly more comfortable today moving around in his chair rather than sitting in a very stiff position as he did in the past.  He has seen Dr. Lynann Bologna who has confirmed that his spine is now fused Dr. Lynann Bologna has released him from needing to follow-up with him.  His pain is dramatically improved.  Part of this had to do with his need for higher dose of pain medications many months ago.  He has needed less and less of both long-acting and short acting morphine.  His pain is being managed by Dr. Hardin Negus with pain management.  He has needed more medications for pain now but not his back pain but rather the pain in his postoperative site.  Was having pain in his legs with exertion due to his peripheral vascular disease and was having numbness as well.  He says the color has improved in his left leg after his recent left common femoral to below the knee popliteal bypass with endarterectomy and femoral profundoplasty and patch angioplasty.  He is going to  have another intervention performed after this 1 heals up.   Back pain he rates as being about a 4 out of 10 in severity at present.  He continues to take his antibiotics.     Past Medical History:  Diagnosis Date   Arthritis    hands, knees, back   COPD (chronic obstructive pulmonary disease) ([place])    Depression    Diskitis 68/09/7516   Hardware complicating wound infection ([place]) 02/28/2021   Headache    migraines    Hypertension    Legionella pneumonia ([place]) 02/28/2021   Neuromuscular disorder ([place])    hands   Prostate cancer [facility])     Past Surgical History:  Procedure Laterality Date   ABDOMINAL AORTOGRAM W/LOWER EXTREMITY Bilateral 10/18/2021   Procedure: ABDOMINAL AORTOGRAM W/LOWER EXTREMITY;  Surgeon: Marty Heck, MD;  Location: [facility];  Service: Cardiovascular;  Laterality: Bilateral;   APPENDECTOMY     BACK SURGERY  1980"s   L4 to L 5 laminectomy   COLONOSCOPY WITH PROPOFOL N/A 02/06/2016   Procedure: COLONOSCOPY WITH PROPOFOL;  Surgeon: Garlan Fair, MD;  [facility];  Service: Endoscopy;  Laterality: N/A;   DIAGNOSTIC LAPAROSCOPY     ENDARTERECTOMY FEMORAL Left 10/24/2021   Procedure: ENDARTERECTOMY FEMORAL WITH PROFUNDAPLASTY;  Surgeon: Marty Heck, MD;  Location: [facility];  Service: Vascular;  Laterality: Left;   FEMORAL-POPLITEAL BYPASS GRAFT Left 10/24/2021   Procedure: LEFT COMMON FEMORAL- BELOW KNEE POPLITEAL BYPASS;  Surgeon: Marty Heck, MD;  Location: [facility];  Service: Vascular;  Laterality: Left;   LAPAROSCOPIC APPENDECTOMY N/A 08/13/2018   Procedure: APPENDECTOMY LAPAROSCOPIC;  Surgeon: Alphonsa Overall, MD;  [facility];  Service: General;  Laterality: N/A;   PATCH ANGIOPLASTY Left 10/24/2021   Procedure: PATCH ANGIOPLASTY;  Surgeon: Marty Heck, MD;  Location: [facility];  Service: Vascular;  Laterality: Left;   PELVIC LYMPH NODE DISSECTION Bilateral 02/08/2019   Procedure: PELVIC LYMPH NODE DISSECTION;  Surgeon: Lucas Mallow, MD;  [facility];  Service: Urology;  Laterality: Bilateral;   ROBOT ASSISTED LAPAROSCOPIC RADICAL PROSTATECTOMY N/A 02/08/2019   Procedure: XI ROBOTIC ASSISTED LAPAROSCOPIC RADICAL PROSTATECTOMY;  Surgeon: Lucas Mallow, MD;  [facility];  Service: Urology;  Laterality: N/A;   TRANSFORAMINAL LUMBAR INTERBODY FUSION (TLIF) WITH PEDICLE SCREW FIXATION 1 LEVEL Left 09/12/2020   Procedure:  LEFT-SIDED LUMBAR 1 - LUMBAR 2 TRANSFORAMINAL LUMBAR INTERBODY FUSION WITH INSTRUMENTATION AND ALLOGRAFT;  Surgeon: Phylliss Bob, MD;  Location: [facility];  Service: Orthopedics;  Laterality: Left;  3C BED   VEIN HARVEST Left 10/24/2021   Procedure: VEIN HARVEST;  Surgeon: Marty Heck, MD;  Location: [facility];  Service: Vascular;  Laterality: Left;   WRIST SURGERY Right    x 2     Family History  Problem Relation Age of Onset   Colon cancer Maternal Grandmother    Colon cancer Maternal Great-grandmother    Prostate cancer Neg Hx       Social History   Socioeconomic History   Marital status: Married    Spouse name: Not on file   Number of children: 6   Years of education: Not on file   Highest education level: Not on file  Occupational History   Occupation: Courtyard  Tobacco Use   Smoking status: Every Day    Packs/day: 2.00    Years: 51.00    Pack years: 102.00    Types: Cigarettes   Smokeless tobacco: Never  Vaping  Use   Vaping Use: Never used  Substance and Sexual Activity   Alcohol use: Not Currently    Comment: occasional   Drug use: No   Sexual activity: Not Currently  Other Topics Concern   Not on file  Social History Narrative   Married for 1 years with 3 sons and 3 daughters. Smokes a ppd.    Social Determinants of Health   Financial Resource Strain: Not on file  Food Insecurity: Not on file  Transportation Needs: Not on file  Physical Activity: Not on file  Stress: Not on file  Social Connections: Not on file    Allergies  Allergen Reactions   Neurontin [Gabapentin] Itching and Other (See Comments)    Confusion and sedation.     Current Outpatient Medications:    aspirin 325 MG tablet, Take 325 mg by mouth every evening., Disp: , Rfl:    atorvastatin (LIPITOR) 40 MG tablet, Take 1 tablet (40 mg total) by mouth daily., Disp: 90 tablet, Rfl: 3   cefdinir (OMNICEF) 300 MG capsule, Take 1 capsule (300 mg total) by mouth 2 (two) times daily.,  Disp: 60 capsule, Rfl: 11   doxycycline (VIBRA-TABS) 100 MG tablet, Take 1 tablet (100 mg total) by mouth 2 (two) times daily., Disp: 60 tablet, Rfl: 11   morphine (MS CONTIN) 30 MG 12 hr tablet, Take 30 mg by mouth every evening., Disp: , Rfl:    morphine (MSIR) 15 MG tablet, Take 15 mg by mouth 2 (two) times daily as needed (PAIN.)., Disp: , Rfl:    Multiple Vitamin (MULTIVITAMIN WITH MINERALS) TABS tablet, Take 1 tablet by mouth every evening. Centrum Silver, Disp: , Rfl:    oxyCODONE-acetaminophen (PERCOCET/ROXICET) 5-325 MG tablet, Take 1-2 tablets by mouth every 4 (four) hours as needed (breakthrough pain)., Disp: 30 tablet, Rfl: 0   Review of Systems  Constitutional:  Negative for chills and fever.  HENT:  Negative for congestion and sore throat.   Eyes:  Negative for photophobia.  Respiratory:  Negative for cough, shortness of breath and wheezing.   Cardiovascular:  Positive for leg swelling. Negative for chest pain and palpitations.  Gastrointestinal:  Negative for abdominal pain, blood in stool, constipation, diarrhea, nausea and vomiting.  Genitourinary:  Negative for dysuria, flank pain and hematuria.  Musculoskeletal:  Positive for back pain. Negative for myalgias.  Skin:  Positive for wound. Negative for rash.  Neurological:  Negative for dizziness, speech difficulty, weakness and headaches.  Hematological:  Does not bruise/bleed easily.  Psychiatric/Behavioral:  Negative for agitation, confusion, decreased concentration, self-injury and suicidal ideas.    Color of left foot better versus right which is more pale.    Objective:   Physical Exam Constitutional:      Appearance: He is well-developed.  HENT:     Head: Normocephalic and atraumatic.  Eyes:     Conjunctiva/sclera: Conjunctivae normal.  Cardiovascular:     Rate and Rhythm: Normal rate and regular rhythm.  Pulmonary:     Effort: Pulmonary effort is normal. No respiratory distress.     Breath sounds: No  wheezing.  Abdominal:     General: There is no distension.     Palpations: Abdomen is soft.  Musculoskeletal:        General: No tenderness. Normal range of motion.     Cervical back: Normal range of motion and neck supple.  Skin:    General: Skin is warm and dry.     Coloration: Skin is not pale.  Findings: No erythema or rash.  Neurological:     General: No focal deficit present.     Mental Status: He is alert and oriented to person, place, and time.  Psychiatric:        Mood and Affect: Mood normal.        Behavior: Behavior normal.        Thought Content: Thought content normal.        Judgment: Judgment normal.          Assessment & Plan:  Vertebral osteomyelitis and discitis associate with hardware L1-L2:  I am rechecking sed rate CRP BMP and CBC with differential.  Will continue doxycycline and cefdinir.  Will aim for a minimum of a year though many patients do require much more protracted and in some cases indefinite therapy due to hardware being present   Peripheral vascular disease: Status post bypass surgery on the left and going to have surgery on the right.  Smoking: I have encouraged him to make every effort to stop smoking and even if he cannot stop inhaling nicotine I would personally recommend that he at least switch from conventional cigarettes to vaping.  Vaccine counseling: Recommended that he get updated COVID-19 booster as well as flu shot and Prevnar 20 and he agreed to all three

## 2021-11-05 NOTE — Addendum Note (Signed)
Addended by: Adelfa Koh on: 11/05/2021 04:36 PM   Modules accepted: Orders

## 2021-11-06 LAB — C-REACTIVE PROTEIN: CRP: 2.6 mg/L (ref <8.0)

## 2021-11-06 LAB — CBC WITH DIFFERENTIAL/PLATELET
Absolute Monocytes: 585 cells/uL (ref 200–950)
Basophils Absolute: 77 cells/uL (ref 0–200)
Basophils Relative: 1 %
Eosinophils Absolute: 177 cells/uL (ref 15–500)
Eosinophils Relative: 2.3 %
HCT: 41.1 % (ref 38.5–50.0)
Hemoglobin: 14 g/dL (ref 13.2–17.1)
Lymphs Abs: 2102 cells/uL (ref 850–3900)
MCH: 30 pg (ref 27.0–33.0)
MCHC: 34.1 g/dL (ref 32.0–36.0)
MCV: 88.2 fL (ref 80.0–100.0)
MPV: 9.4 fL (ref 7.5–12.5)
Monocytes Relative: 7.6 %
Neutro Abs: 4759 cells/uL (ref 1500–7800)
Neutrophils Relative %: 61.8 %
Platelets: 347 10*3/uL (ref 140–400)
RBC: 4.66 10*6/uL (ref 4.20–5.80)
RDW: 13.6 % (ref 11.0–15.0)
Total Lymphocyte: 27.3 %
WBC: 7.7 10*3/uL (ref 3.8–10.8)

## 2021-11-06 LAB — BASIC METABOLIC PANEL WITH GFR
BUN: 20 mg/dL (ref 7–25)
CO2: 30 mmol/L (ref 20–32)
Calcium: 9.8 mg/dL (ref 8.6–10.3)
Chloride: 102 mmol/L (ref 98–110)
Creat: 0.96 mg/dL (ref 0.70–1.35)
Glucose, Bld: 153 mg/dL — ABNORMAL HIGH (ref 65–99)
Potassium: 4.4 mmol/L (ref 3.5–5.3)
Sodium: 139 mmol/L (ref 135–146)
eGFR: 88 mL/min/{1.73_m2} (ref >=60)

## 2021-11-06 LAB — SEDIMENTATION RATE: Sed Rate: 28 mm/h — ABNORMAL HIGH (ref 0–20)

## 2021-11-26 ENCOUNTER — Other Ambulatory Visit: Payer: Self-pay

## 2021-11-26 DIAGNOSIS — I739 Peripheral vascular disease, unspecified: Secondary | ICD-10-CM

## 2021-12-04 ENCOUNTER — Ambulatory Visit (INDEPENDENT_AMBULATORY_CARE_PROVIDER_SITE_OTHER)
Admission: RE | Admit: 2021-12-04 | Discharge: 2021-12-04 | Disposition: A | Discharge status: Home / Self Care | Payer: 59 | Source: Ambulatory Visit | Attending: Vascular Surgery | Admitting: Vascular Surgery

## 2021-12-04 ENCOUNTER — Other Ambulatory Visit: Payer: Self-pay

## 2021-12-04 ENCOUNTER — Encounter: Payer: Self-pay | Admitting: Vascular Surgery

## 2021-12-04 ENCOUNTER — Ambulatory Visit (HOSPITAL_COMMUNITY)
Admission: RE | Admit: 2021-12-04 | Discharge: 2021-12-04 | Disposition: A | Discharge status: Home / Self Care | Payer: 59 | Source: Ambulatory Visit | Attending: Vascular Surgery | Admitting: Vascular Surgery

## 2021-12-04 ENCOUNTER — Ambulatory Visit (INDEPENDENT_AMBULATORY_CARE_PROVIDER_SITE_OTHER): Payer: 59 | Admitting: Vascular Surgery

## 2021-12-04 VITALS — BP 150/78 | HR 86 | Temp 98.1°F | Resp 16 | Ht 70.0 in | Wt 191.0 lb

## 2021-12-04 DIAGNOSIS — I739 Peripheral vascular disease, unspecified: Secondary | ICD-10-CM

## 2021-12-04 NOTE — Progress Notes (Signed)
Patient name: Tyrone [name]. MRN: [id number] DOB: 1956-07-21 Sex: male  REASON FOR CONSULT: Post-op check after left leg bypass  HPI: Ousmane Seeman. is a 66 y.o. male, with history of COPD, hypertension, prostate cancer, tobacco abuse that presents for postop check after left leg bypass.  He was initially evaluated for severe PAD.  He had severe claudication worse in the left leg with some symptoms consistent with rest pain.  ABI of 0.44 on the right and 0.40 on the left.  His left leg bypass was done on 10/24/2021 with a left common femoral endarterectomy and bovine patch and a left common femoral to BK pop bypass with PTFE.  No issues on follow-up today.  States his left leg is doing much better.  Past Medical History:  Diagnosis Date   Arthritis    hands, knees, back   COPD (chronic obstructive pulmonary disease) ([place])    Depression    Diskitis 32/99/2426   Hardware complicating wound infection ([place]) 02/28/2021   Headache    migraines   Hypertension    Legionella pneumonia ([place]) 02/28/2021   Neuromuscular disorder ([place])    hands   Prostate cancer ([place])    Smoker 11/05/2021   Vaccine counseling 11/05/2021    Past Surgical History:  Procedure Laterality Date   ABDOMINAL AORTOGRAM W/LOWER EXTREMITY Bilateral 10/18/2021   Procedure: ABDOMINAL AORTOGRAM W/LOWER EXTREMITY;  Surgeon: Marty Heck, MD;  Location: [facility];  Service: Cardiovascular;  Laterality: Bilateral;   APPENDECTOMY     BACK SURGERY  1980"s   L4 to L 5 laminectomy   COLONOSCOPY WITH PROPOFOL N/A 02/06/2016   Procedure: COLONOSCOPY WITH PROPOFOL;  Surgeon: Garlan Fair, MD;  [facility];  Service: Endoscopy;  Laterality: N/A;   DIAGNOSTIC LAPAROSCOPY     ENDARTERECTOMY FEMORAL Left 10/24/2021   Procedure: ENDARTERECTOMY FEMORAL WITH PROFUNDAPLASTY;  Surgeon: Marty Heck, MD;  Location: [facility];  Service: Vascular;  Laterality: Left;   FEMORAL-POPLITEAL BYPASS GRAFT  Left 10/24/2021   Procedure: LEFT COMMON FEMORAL- BELOW KNEE POPLITEAL BYPASS;  Surgeon: Marty Heck, MD;  Location: [facility];  Service: Vascular;  Laterality: Left;   LAPAROSCOPIC APPENDECTOMY N/A 08/13/2018   Procedure: APPENDECTOMY LAPAROSCOPIC;  Surgeon: Alphonsa Overall, MD;  [facility];  Service: General;  Laterality: N/A;   PATCH ANGIOPLASTY Left 10/24/2021   Procedure: PATCH ANGIOPLASTY;  Surgeon: Marty Heck, MD;  Location: [facility];  Service: Vascular;  Laterality: Left;   PELVIC LYMPH NODE DISSECTION Bilateral 02/08/2019   Procedure: PELVIC LYMPH NODE DISSECTION;  Surgeon: Lucas Mallow, MD;  [facility];  Service: Urology;  Laterality: Bilateral;   ROBOT ASSISTED LAPAROSCOPIC RADICAL PROSTATECTOMY N/A 02/08/2019   Procedure: XI ROBOTIC ASSISTED LAPAROSCOPIC RADICAL PROSTATECTOMY;  Surgeon: Lucas Mallow, MD;  [facility];  Service: Urology;  Laterality: N/A;   TRANSFORAMINAL LUMBAR INTERBODY FUSION (TLIF) WITH PEDICLE SCREW FIXATION 1 LEVEL Left 09/12/2020   Procedure: LEFT-SIDED LUMBAR 1 - LUMBAR 2 TRANSFORAMINAL LUMBAR INTERBODY FUSION WITH INSTRUMENTATION AND ALLOGRAFT;  Surgeon: Phylliss Bob, MD;  Location: [facility];  Service: Orthopedics;  Laterality: Left;  3C BED   VEIN HARVEST Left 10/24/2021   Procedure: VEIN HARVEST;  Surgeon: Marty Heck, MD;  Location: [facility];  Service: Vascular;  Laterality: Left;   WRIST SURGERY Right    x 2     Family History  Problem Relation Age of Onset   Colon cancer Maternal Grandmother  Colon cancer Maternal Great-grandmother    Prostate cancer Neg Hx     SOCIAL HISTORY: Social History   Socioeconomic History   Marital status: Married    Spouse name: Not on file   Number of children: 6   Years of education: Not on file   Highest education level: Not on file  Occupational History   Occupation: Courtyard  Tobacco Use   Smoking status: Every Day    Packs/day: 2.00    Years: 51.00    Pack  years: 102.00    Types: Cigarettes   Smokeless tobacco: Never  Vaping Use   Vaping Use: Never used  Substance and Sexual Activity   Alcohol use: Not Currently    Comment: occasional   Drug use: No   Sexual activity: Not Currently  Other Topics Concern   Not on file  Social History Narrative   Married for 2 years with 3 sons and 3 daughters. Smokes a ppd.    Social Determinants of Health   Financial Resource Strain: Not on file  Food Insecurity: Not on file  Transportation Needs: Not on file  Physical Activity: Not on file  Stress: Not on file  Social Connections: Not on file  Intimate Partner Violence: Not on file    Allergies  Allergen Reactions   Neurontin [Gabapentin] Itching and Other (See Comments)    Confusion and sedation.    Current Outpatient Medications  Medication Sig Dispense Refill   aspirin 325 MG tablet Take 325 mg by mouth every evening.     atorvastatin (LIPITOR) 40 MG tablet Take 1 tablet (40 mg total) by mouth daily. 90 tablet 3   cefdinir (OMNICEF) 300 MG capsule Take 1 capsule (300 mg total) by mouth 2 (two) times daily. 60 capsule 11   doxycycline (VIBRA-TABS) 100 MG tablet Take 1 tablet (100 mg total) by mouth 2 (two) times daily. 60 tablet 11   morphine (MS CONTIN) 30 MG 12 hr tablet Take 30 mg by mouth every evening.     Multiple Vitamin (MULTIVITAMIN WITH MINERALS) TABS tablet Take 1 tablet by mouth every evening. Centrum Silver     morphine (MSIR) 15 MG tablet Take 15 mg by mouth 2 (two) times daily as needed (PAIN.). (Patient not taking: Reported on 12/04/2021)     oxyCODONE-acetaminophen (PERCOCET/ROXICET) 5-325 MG tablet Take 1-2 tablets by mouth every 4 (four) hours as needed (breakthrough pain). 30 tablet 0   No current facility-administered medications for this visit.    REVIEW OF SYSTEMS:  [X]  denotes positive finding, [ ]  denotes negative finding Cardiac  Comments:  Chest pain or chest pressure:    Shortness of breath upon exertion:     Short of breath when lying flat:    Irregular heart rhythm:        Vascular    Pain in calf, thigh, or hip brought on by ambulation:    Pain in feet at night that wakes you up from your sleep:     Blood clot in your veins:    Leg swelling:         Pulmonary    Oxygen at home:    Productive cough:     Wheezing:         Neurologic    Sudden weakness in arms or legs:     Sudden numbness in arms or legs:     Sudden onset of difficulty speaking or slurred speech:    Temporary loss of vision in one eye:  Problems with dizziness:         Gastrointestinal    Blood in stool:     Vomited blood:         Genitourinary    Burning when urinating:     Blood in urine:        Psychiatric    Major depression:         Hematologic    Bleeding problems:    Problems with blood clotting too easily:        Skin    Rashes or ulcers:        Constitutional    Fever or chills:      PHYSICAL EXAM: Vitals:   12/04/21 1157  BP: (!) 150/78  Pulse: 86  Resp: 16  Temp: 98.1 F (36.7 C)  TempSrc: Temporal  SpO2: 96%  Weight: 191 lb (86.6 kg)  Height: 5\' 10"  (1.778 m)    GENERAL: The patient is a well-nourished male, in no acute distress. The vital signs are documented above. CARDIAC: There is a regular rate and rhythm.  VASCULAR:  Left groin and calf incision healing well with a palpable pedal pulse in the left footLeft groin and calf incision healing well with palpable left pedal pulse  DATA:   ABIs today are improved to 1.03 on the left triphasic from 0.4 and 0.54 on the right  Left leg duplex shows widely patent bypass graft with no stenosis.  Assessment/Plan:  66 year old male presents for post-op check after recent left common femoral endarterectomy with bovine patch and a left common femoral to below knee popliteal bypass with PTFE on 10/24/2021 for lifestyle limiting claudication with early rest pain.  ABIs are improved to 1 triphasic from previous 0.4.  Widely patent  bypass on duplex.Marland Kitchen  His incisions have healed nicely.  He has a palpable pedal pulse.  We are going to monitor his right leg and I will have him follow-up in a month given he does not have any good surface vein and I would like him to really trial conservative therapy with exercise and walking now that his left leg is improved and that was the more symptomatic leg.  I think any endovascular option in the right leg would require stenting down to the knee joint may be poorly durable.   Marty Heck, MD [facility] Office: [phone]

## 2022-01-01 ENCOUNTER — Ambulatory Visit (INDEPENDENT_AMBULATORY_CARE_PROVIDER_SITE_OTHER): Payer: 59 | Admitting: Vascular Surgery

## 2022-01-01 ENCOUNTER — Other Ambulatory Visit: Payer: Self-pay

## 2022-01-01 ENCOUNTER — Encounter: Payer: Self-pay | Admitting: Vascular Surgery

## 2022-01-01 VITALS — BP 156/79 | HR 79 | Temp 98.6°F | Resp 18 | Ht 70.0 in | Wt 199.0 lb

## 2022-01-01 DIAGNOSIS — I739 Peripheral vascular disease, unspecified: Secondary | ICD-10-CM

## 2022-01-01 DIAGNOSIS — I70222 Atherosclerosis of native arteries of extremities with rest pain, left leg: Secondary | ICD-10-CM

## 2022-01-01 MED ORDER — CILOSTAZOL 100 MG PO TABS: 100.0000 mg | ORAL_TABLET | Freq: Two times a day (BID) | ORAL | 11 refills | Status: DC

## 2022-01-01 NOTE — Progress Notes (Signed)
? ? ?Patient name: Tyrone [name]. MRN: [id number] DOB: 08/20/1956 Sex: male ? ?REASON FOR CONSULT: 1 month follow-up after left leg bypass ? ?HPI: ?Tyrone [name]. is a 66 y.o. male, with history of COPD, hypertension, prostate cancer, tobacco abuse that presents for continued follow-up after recent left leg bypass.  He was initially evaluated for severe PAD.  He had severe claudication worse in the left leg with some symptoms consistent with rest pain.  ABI of 0.44 on the right and 0.40 on the left.  His left leg bypass was done on 10/24/2021 with a left common femoral endarterectomy and bovine patch and a left common femoral to below knee popliteal artery bypass with PTFE.   ? ?He is back at work.  He feels his left leg is much better.  He has cut down from smoking 2 packs a day to 1.5 packs a day.  Still having claudication in the right calf after walking about 100 to 200 feet and then has to stop.  Limiting his ability to work. ? ?Past Medical History:  ?Diagnosis Date  ? Arthritis   ? hands, knees, back  ? COPD (chronic obstructive pulmonary disease) ([place])   ? Depression   ? Diskitis 02/28/2021  ? Hardware complicating wound infection ([place]) 02/28/2021  ? Headache   ? migraines  ? Hypertension   ? Legionella pneumonia ([place]) 02/28/2021  ? Neuromuscular disorder ([place])   ? hands  ? Prostate cancer ([place])   ? Smoker 11/05/2021  ? Vaccine counseling 11/05/2021  ? ? ?Past Surgical History:  ?Procedure Laterality Date  ? ABDOMINAL AORTOGRAM W/LOWER EXTREMITY Bilateral 10/18/2021  ? Procedure: ABDOMINAL AORTOGRAM W/LOWER EXTREMITY;  Surgeon: Marty Heck, MD;  Location: [facility];  Service: Cardiovascular;  Laterality: Bilateral;  ? APPENDECTOMY    ? BACK SURGERY  1980"s  ? L4 to L 5 laminectomy  ? COLONOSCOPY WITH PROPOFOL N/A 02/06/2016  ? Procedure: COLONOSCOPY WITH PROPOFOL;  Surgeon: Garlan Fair, MD;  [facility];  Service: Endoscopy;  Laterality: N/A;  ? DIAGNOSTIC LAPAROSCOPY     ? ENDARTERECTOMY FEMORAL Left 10/24/2021  ? Procedure: ENDARTERECTOMY FEMORAL WITH PROFUNDAPLASTY;  Surgeon: Marty Heck, MD;  Location: [place];  Service: Vascular;  Laterality: Left;  ? FEMORAL-POPLITEAL BYPASS GRAFT Left 10/24/2021  ? Procedure: LEFT COMMON FEMORAL- BELOW KNEE POPLITEAL BYPASS;  Surgeon: Marty Heck, MD;  Location: [facility];  Service: Vascular;  Laterality: Left;  ? LAPAROSCOPIC APPENDECTOMY N/A 08/13/2018  ? Procedure: APPENDECTOMY LAPAROSCOPIC;  Surgeon: Alphonsa Overall, MD;  [facility];  Service: General;  Laterality: N/A;  ? PATCH ANGIOPLASTY Left 10/24/2021  ? Procedure: PATCH ANGIOPLASTY;  Surgeon: Marty Heck, MD;  Location: [facility];  Service: Vascular;  Laterality: Left;  ? PELVIC LYMPH NODE DISSECTION Bilateral 02/08/2019  ? Procedure: PELVIC LYMPH NODE DISSECTION;  Surgeon: Lucas Mallow, MD;  [facility];  Service: Urology;  Laterality: Bilateral;  ? ROBOT ASSISTED LAPAROSCOPIC RADICAL PROSTATECTOMY N/A 02/08/2019  ? Procedure: XI ROBOTIC ASSISTED LAPAROSCOPIC RADICAL PROSTATECTOMY;  Surgeon: Lucas Mallow, MD;  [facility];  Service: Urology;  Laterality: N/A;  ? TRANSFORAMINAL LUMBAR INTERBODY FUSION (TLIF) WITH PEDICLE SCREW FIXATION 1 LEVEL Left 09/12/2020  ? Procedure: LEFT-SIDED LUMBAR 1 - LUMBAR 2 TRANSFORAMINAL LUMBAR INTERBODY FUSION WITH INSTRUMENTATION AND ALLOGRAFT;  Surgeon: Phylliss Bob, MD;  Location: [facility];  Service: Orthopedics;  Laterality: Left;  3C BED  ? VEIN HARVEST Left 10/24/2021  ?  Procedure: VEIN HARVEST;  Surgeon: Marty Heck, MD;  [facility];  Service: Vascular;  Laterality: Left;  ? WRIST SURGERY Right   ? x 2   ? ? ?Family History  ?Problem Relation Age of Onset  ? Colon cancer Maternal Grandmother   ? Colon cancer Maternal Great-grandmother   ? Prostate cancer Neg Hx   ? ? ?SOCIAL HISTORY: ?Social History  ? ?Socioeconomic History  ? Marital status: Married  ?  Spouse name: Not on file  ? Number of  children: 6  ? Years of education: Not on file  ? Highest education level: Not on file  ?Occupational History  ? Occupation: Courtyard  ?Tobacco Use  ? Smoking status: Every Day  ?  Packs/day: 2.00  ?  Years: 51.00  ?  Pack years: 102.00  ?  Types: Cigarettes  ? Smokeless tobacco: Never  ?Vaping Use  ? Vaping Use: Never used  ?Substance and Sexual Activity  ? Alcohol use: Not Currently  ?  Comment: occasional  ? Drug use: No  ? Sexual activity: Not Currently  ?Other Topics Concern  ? Not on file  ?Social History Narrative  ? Married for 50 years with 3 sons and 3 daughters. Smokes a ppd.   ? ?Social Determinants of Health  ? ?Financial Resource Strain: Not on file  ?Food Insecurity: Not on file  ?Transportation Needs: Not on file  ?Physical Activity: Not on file  ?Stress: Not on file  ?Social Connections: Not on file  ?Intimate Partner Violence: Not on file  ? ? ?Allergies  ?Allergen Reactions  ? Neurontin [Gabapentin] Itching and Other (See Comments)  ?  Confusion and sedation.  ? ? ?Current Outpatient Medications  ?Medication Sig Dispense Refill  ? aspirin 325 MG tablet Take 325 mg by mouth every evening.    ? atorvastatin (LIPITOR) 40 MG tablet Take 1 tablet (40 mg total) by mouth daily. 90 tablet 3  ? cefdinir (OMNICEF) 300 MG capsule Take 1 capsule (300 mg total) by mouth 2 (two) times daily. 60 capsule 11  ? doxycycline (VIBRA-TABS) 100 MG tablet Take 1 tablet (100 mg total) by mouth 2 (two) times daily. 60 tablet 11  ? morphine (MS CONTIN) 30 MG 12 hr tablet Take 30 mg by mouth every evening.    ? morphine (MSIR) 15 MG tablet Take 15 mg by mouth 2 (two) times daily as needed (PAIN.).    ? Multiple Vitamin (MULTIVITAMIN WITH MINERALS) TABS tablet Take 1 tablet by mouth every evening. Centrum Silver    ? oxyCODONE-acetaminophen (PERCOCET/ROXICET) 5-325 MG tablet Take 1-2 tablets by mouth every 4 (four) hours as needed (breakthrough pain). 30 tablet 0  ? ?No current facility-administered medications for this  visit.  ? ? ?REVIEW OF SYSTEMS:  ?'[X]'$  denotes positive finding, '[ ]'$  denotes negative finding ?Cardiac  Comments:  ?Chest pain or chest pressure:    ?Shortness of breath upon exertion:    ?Short of breath when lying flat:    ?Irregular heart rhythm:    ?    ?Vascular    ?Pain in calf, thigh, or hip brought on by ambulation:    ?Pain in feet at night that wakes you up from your sleep:     ?Blood clot in your veins:    ?Leg swelling:     ?    ?Pulmonary    ?Oxygen at home:    ?Productive cough:     ?Wheezing:     ?    ?  Neurologic    ?Sudden weakness in arms or legs:     ?Sudden numbness in arms or legs:     ?Sudden onset of difficulty speaking or slurred speech:    ?Temporary loss of vision in one eye:     ?Problems with dizziness:     ?    ?Gastrointestinal    ?Blood in stool:     ?Vomited blood:     ?    ?Genitourinary    ?Burning when urinating:     ?Blood in urine:    ?    ?Psychiatric    ?Major depression:     ?    ?Hematologic    ?Bleeding problems:    ?Problems with blood clotting too easily:    ?    ?Skin    ?Rashes or ulcers:    ?    ?Constitutional    ?Fever or chills:    ? ? ?PHYSICAL EXAM: ?Vitals:  ? 01/01/22 0818  ?BP: (!) 156/79  ?Pulse: 79  ?Resp: 18  ?Temp: 98.6 ?F (37 ?C)  ?TempSrc: Temporal  ?SpO2: 97%  ?Weight: 199 lb (90.3 kg)  ?Height: '5\' 10"'$  (1.778 m)  ? ? ?GENERAL: The patient is a well-nourished male, in no acute distress. The vital signs are documented above. ?CARDIAC: There is a regular rate and rhythm.  ?VASCULAR:  ?Left groin and calf incision well-healed with a left DP that is palpable ?Right femoral 1+ palpable ?No palpable right pedal pulses ?No right leg tissueloss ? ?DATA:  ? ?ABIs 12/04/21 1.03 on the left triphasic from 0.4 and 0.54 on the right ? ? ?Assessment/Plan: ? ?66 year old male presents for 1 month follow-up after recent left common femoral endarterectomy with bovine patch and a left common femoral to below knee popliteal bypass with PTFE on 10/24/2021 for lifestyle limiting  claudication with early rest pain.  His left leg continues to do well and he has a palpable left DP pulse.  He has cut back from smoking 2 packs a day to 1.5 packs a day.  I have strongly encouraged him to c

## 2022-02-25 ENCOUNTER — Telehealth: Payer: Self-pay

## 2022-02-25 NOTE — Telephone Encounter (Signed)
Pt called with c/o worsening claudication of RLE. He is requesting to move up his June appt. Pt has been r/s for this week with MD and ABIs. He is aware of this appt.  ?

## 2022-02-26 ENCOUNTER — Other Ambulatory Visit: Payer: Self-pay

## 2022-02-26 ENCOUNTER — Ambulatory Visit (HOSPITAL_COMMUNITY)
Admission: RE | Admit: 2022-02-26 | Discharge: 2022-02-26 | Disposition: A | Discharge status: Home / Self Care | Payer: 59 | Source: Ambulatory Visit | Attending: Vascular Surgery | Admitting: Vascular Surgery

## 2022-02-26 ENCOUNTER — Ambulatory Visit (INDEPENDENT_AMBULATORY_CARE_PROVIDER_SITE_OTHER): Payer: 59 | Admitting: Vascular Surgery

## 2022-02-26 ENCOUNTER — Encounter: Payer: Self-pay | Admitting: Vascular Surgery

## 2022-02-26 DIAGNOSIS — I70221 Atherosclerosis of native arteries of extremities with rest pain, right leg: Secondary | ICD-10-CM | POA: Diagnosis not present

## 2022-02-26 DIAGNOSIS — I739 Peripheral vascular disease, unspecified: Secondary | ICD-10-CM | POA: Insufficient documentation

## 2022-02-26 DIAGNOSIS — I70223 Atherosclerosis of native arteries of extremities with rest pain, bilateral legs: Secondary | ICD-10-CM

## 2022-02-26 NOTE — Progress Notes (Signed)
? ? ?Patient name: Tyrone [name]. MRN: [id number] DOB: December 23, 1955 Sex: male ? ?REASON FOR CONSULT: triage visit increasing right leg pain ? ?HPI: ?Dillard Pascal. is a 66 y.o. male, with history of COPD, hypertension, prostate cancer, tobacco abuse that presents for triage visit for increasing right leg pain.  He was initially evaluated for severe PAD.  He had severe claudication worse in the left leg with some symptoms consistent with rest pain.  ABI of 0.44 on the right and 0.40 on the left at initial evaluation. He had a left leg bypass on 10/24/2021 with a left common femoral endarterectomy and bovine patch and a left common femoral to below knee popliteal artery bypass with PTFE.  We have been trying to manage his right leg claudication medically. ? ?He states over the last week he started having increasing pain in the right foot.  Now the foot is numb at night keeping him awake.  States he has not slept since 3 AM.  Unable to really work to his full capacity.  He has quit smoking which I congratulated him.  No wounds at this time. ? ? ? ?Past Medical History:  ?Diagnosis Date  ? Arthritis   ? hands, knees, back  ? COPD (chronic obstructive pulmonary disease) ([place])   ? Depression   ? Diskitis 02/28/2021  ? Hardware complicating wound infection ([place]) 02/28/2021  ? Headache   ? migraines  ? Hypertension   ? Legionella pneumonia ([place]) 02/28/2021  ? Neuromuscular disorder ([place])   ? hands  ? Prostate cancer ([place])   ? Smoker 11/05/2021  ? Vaccine counseling 11/05/2021  ? ? ?Past Surgical History:  ?Procedure Laterality Date  ? ABDOMINAL AORTOGRAM W/LOWER EXTREMITY Bilateral 10/18/2021  ? Procedure: ABDOMINAL AORTOGRAM W/LOWER EXTREMITY;  Surgeon: Marty Heck, MD;  Location: [facility];  Service: Cardiovascular;  Laterality: Bilateral;  ? APPENDECTOMY    ? BACK SURGERY  1980"s  ? L4 to L 5 laminectomy  ? COLONOSCOPY WITH PROPOFOL N/A 02/06/2016  ? Procedure: COLONOSCOPY WITH PROPOFOL;  Surgeon:  Garlan Fair, MD;  [facility];  Service: Endoscopy;  Laterality: N/A;  ? DIAGNOSTIC LAPAROSCOPY    ? ENDARTERECTOMY FEMORAL Left 10/24/2021  ? Procedure: ENDARTERECTOMY FEMORAL WITH PROFUNDAPLASTY;  Surgeon: Marty Heck, MD;  Location: [place];  Service: Vascular;  Laterality: Left;  ? FEMORAL-POPLITEAL BYPASS GRAFT Left 10/24/2021  ? Procedure: LEFT COMMON FEMORAL- BELOW KNEE POPLITEAL BYPASS;  Surgeon: Marty Heck, MD;  Location: [place];  Service: Vascular;  Laterality: Left;  ? LAPAROSCOPIC APPENDECTOMY N/A 08/13/2018  ? Procedure: APPENDECTOMY LAPAROSCOPIC;  Surgeon: Alphonsa Overall, MD;  [facility];  Service: General;  Laterality: N/A;  ? PATCH ANGIOPLASTY Left 10/24/2021  ? Procedure: PATCH ANGIOPLASTY;  Surgeon: Marty Heck, MD;  Location: [place];  Service: Vascular;  Laterality: Left;  ? PELVIC LYMPH NODE DISSECTION Bilateral 02/08/2019  ? Procedure: PELVIC LYMPH NODE DISSECTION;  Surgeon: Lucas Mallow, MD;  [facility];  Service: Urology;  Laterality: Bilateral;  ? ROBOT ASSISTED LAPAROSCOPIC RADICAL PROSTATECTOMY N/A 02/08/2019  ? Procedure: XI ROBOTIC ASSISTED LAPAROSCOPIC RADICAL PROSTATECTOMY;  Surgeon: Lucas Mallow, MD;  [facility];  Service: Urology;  Laterality: N/A;  ? TRANSFORAMINAL LUMBAR INTERBODY FUSION (TLIF) WITH PEDICLE SCREW FIXATION 1 LEVEL Left 09/12/2020  ? Procedure: LEFT-SIDED LUMBAR 1 - LUMBAR 2 TRANSFORAMINAL LUMBAR INTERBODY FUSION WITH INSTRUMENTATION AND ALLOGRAFT;  Surgeon: Phylliss Bob, MD;  Location: [place];  Service: Orthopedics;  Laterality: Left;  3C BED  ? VEIN HARVEST Left 10/24/2021  ? Procedure: VEIN HARVEST;  Surgeon: Marty Heck, MD;  Location[facility];  Service: Vascular;  Laterality: Left;  ? WRIST SURGERY Right   ? x 2   ? ? ?Family History  ?Problem Relation Age of Onset  ? Colon cancer Maternal Grandmother   ? Colon cancer Maternal Great-grandmother   ? Prostate cancer Neg Hx   ? ? ?SOCIAL  HISTORY: ?Social History  ? ?Socioeconomic History  ? Marital status: Married  ?  Spouse name: Not on file  ? Number of children: 6  ? Years of education: Not on file  ? Highest education level: Not on file  ?Occupational History  ? Occupation: Courtyard  ?Tobacco Use  ? Smoking status: Every Day  ?  Packs/day: 2.00  ?  Years: 51.00  ?  Pack years: 102.00  ?  Types: Cigarettes  ? Smokeless tobacco: Never  ?Vaping Use  ? Vaping Use: Never used  ?Substance and Sexual Activity  ? Alcohol use: Not Currently  ?  Comment: occasional  ? Drug use: No  ? Sexual activity: Not Currently  ?Other Topics Concern  ? Not on file  ?Social History Narrative  ? Married for 62 years with 3 sons and 3 daughters. Smokes a ppd.   ? ?Social Determinants of Health  ? ?Financial Resource Strain: Not on file  ?Food Insecurity: Not on file  ?Transportation Needs: Not on file  ?Physical Activity: Not on file  ?Stress: Not on file  ?Social Connections: Not on file  ?Intimate Partner Violence: Not on file  ? ? ?Allergies  ?Allergen Reactions  ? Neurontin [Gabapentin] Itching and Other (See Comments)  ?  Confusion and sedation.  ? ? ?Current Outpatient Medications  ?Medication Sig Dispense Refill  ? aspirin 325 MG tablet Take 325 mg by mouth every evening.    ? atorvastatin (LIPITOR) 40 MG tablet Take 1 tablet (40 mg total) by mouth daily. 90 tablet 3  ? morphine (MS CONTIN) 30 MG 12 hr tablet Take 30 mg by mouth every evening.    ? morphine (MSIR) 15 MG tablet Take 15 mg by mouth 2 (two) times daily as needed (PAIN.).    ? Multiple Vitamin (MULTIVITAMIN WITH MINERALS) TABS tablet Take 1 tablet by mouth every evening. Centrum Silver    ? cefdinir (OMNICEF) 300 MG capsule Take 1 capsule (300 mg total) by mouth 2 (two) times daily. 60 capsule 11  ? cilostazol (PLETAL) 100 MG tablet Take 1 tablet (100 mg total) by mouth 2 (two) times daily before a meal. 60 tablet 11  ? doxycycline (VIBRA-TABS) 100 MG tablet Take 1 tablet (100 mg total) by mouth 2  (two) times daily. 60 tablet 11  ? oxyCODONE-acetaminophen (PERCOCET/ROXICET) 5-325 MG tablet Take 1-2 tablets by mouth every 4 (four) hours as needed (breakthrough pain). 30 tablet 0  ? ?No current facility-administered medications for this visit.  ? ? ?REVIEW OF SYSTEMS:  ?'[X]'$  denotes positive finding, '[ ]'$  denotes negative finding ?Cardiac  Comments:  ?Chest pain or chest pressure:    ?Shortness of breath upon exertion:    ?Short of breath when lying flat:    ?Irregular heart rhythm:    ?    ?Vascular    ?Pain in calf, thigh, or hip brought on by ambulation:    ?Pain in feet at night that wakes you up from your sleep:  x Right foot  ?  Blood clot in your veins:    ?Leg swelling:     ?    ?Pulmonary    ?Oxygen at home:    ?Productive cough:     ?Wheezing:     ?    ?Neurologic    ?Sudden weakness in arms or legs:     ?Sudden numbness in arms or legs:     ?Sudden onset of difficulty speaking or slurred speech:    ?Temporary loss of vision in one eye:     ?Problems with dizziness:     ?    ?Gastrointestinal    ?Blood in stool:     ?Vomited blood:     ?    ?Genitourinary    ?Burning when urinating:     ?Blood in urine:    ?    ?Psychiatric    ?Major depression:     ?    ?Hematologic    ?Bleeding problems:    ?Problems with blood clotting too easily:    ?    ?Skin    ?Rashes or ulcers:    ?    ?Constitutional    ?Fever or chills:    ? ? ?PHYSICAL EXAM: ?Vitals:  ? 02/26/22 1208  ?BP: 138/77  ?Pulse: 66  ?Resp: 16  ?Temp: 97.9 ?F (36.6 ?C)  ?TempSrc: Temporal  ?SpO2: 97%  ?Weight: 198 lb (89.8 kg)  ?Height: '5\' 10"'$  (1.778 m)  ? ? ?GENERAL: The patient is a well-nourished male, in no acute distress. The vital signs are documented above. ?CARDIAC: There is a regular rate and rhythm.  ?VASCULAR:  ?Bilateral femoral pulses palpable ?Left DP palpable ?Right pedal pulses nonpalpable but no tissue loss ? ?DATA:  ? ?ABIs on the right have dropped from 0.54 on 12/04/21 to 0.39 today/0.95 triphasic on the  left ? ? ?Assessment/Plan: ? ?66 year old male well-known to me that has undergone left common femoral endarterectomy with bovine patch and a left common femoral to below knee popliteal bypass with PTFE on 10/24/2021 for lifestyle limiting clau

## 2022-02-28 ENCOUNTER — Ambulatory Visit (HOSPITAL_COMMUNITY)
Admission: RE | Admit: 2022-02-28 | Discharge: 2022-02-28 | Disposition: A | Discharge status: Home / Self Care | Payer: 59 | Attending: Vascular Surgery | Admitting: Vascular Surgery

## 2022-02-28 ENCOUNTER — Other Ambulatory Visit: Payer: Self-pay

## 2022-02-28 ENCOUNTER — Encounter (HOSPITAL_COMMUNITY)
Admission: RE | Disposition: A | Discharge status: Home / Self Care | Payer: Self-pay | Source: Home / Self Care | Attending: Vascular Surgery

## 2022-02-28 DIAGNOSIS — Z87891 Personal history of nicotine dependence: Secondary | ICD-10-CM | POA: Diagnosis not present

## 2022-02-28 DIAGNOSIS — Z8546 Personal history of malignant neoplasm of prostate: Secondary | ICD-10-CM | POA: Insufficient documentation

## 2022-02-28 DIAGNOSIS — J449 Chronic obstructive pulmonary disease, unspecified: Secondary | ICD-10-CM | POA: Diagnosis not present

## 2022-02-28 DIAGNOSIS — I70221 Atherosclerosis of native arteries of extremities with rest pain, right leg: Secondary | ICD-10-CM | POA: Insufficient documentation

## 2022-02-28 DIAGNOSIS — I10 Essential (primary) hypertension: Secondary | ICD-10-CM | POA: Insufficient documentation

## 2022-02-28 DIAGNOSIS — I70223 Atherosclerosis of native arteries of extremities with rest pain, bilateral legs: Secondary | ICD-10-CM

## 2022-02-28 DIAGNOSIS — Z419 Encounter for procedure for purposes other than remedying health state, unspecified: Secondary | ICD-10-CM

## 2022-02-28 HISTORY — PX: ABDOMINAL AORTOGRAM W/LOWER EXTREMITY: CATH118223

## 2022-02-28 LAB — POCT I-STAT, CHEM 8
BUN: 14 mg/dL (ref 8–23)
Calcium, Ion: 1.16 mmol/L (ref 1.15–1.40)
Chloride: 102 mmol/L (ref 98–111)
Creatinine, Ser: 0.8 mg/dL (ref 0.61–1.24)
Glucose, Bld: 105 mg/dL — ABNORMAL HIGH (ref 70–99)
HCT: 40 % (ref 39.0–52.0)
Hemoglobin: 13.6 g/dL (ref 13.0–17.0)
Potassium: 4.3 mmol/L (ref 3.5–5.1)
Sodium: 138 mmol/L (ref 135–145)
TCO2: 29 mmol/L (ref 22–32)

## 2022-02-28 SURGERY — ABDOMINAL AORTOGRAM W/LOWER EXTREMITY
Anesthesia: LOCAL | Laterality: Right

## 2022-02-28 MED ORDER — SODIUM CHLORIDE 0.9 % IV SOLN: INTRAVENOUS | Status: AC

## 2022-02-28 MED ORDER — HEPARIN (PORCINE) IN NACL 1000-0.9 UT/500ML-% IV SOLN
INTRAVENOUS | Status: DC | PRN
  Administered 2022-02-28 (×2): 500 mL

## 2022-02-28 MED ORDER — HEPARIN (PORCINE) IN NACL 1000-0.9 UT/500ML-% IV SOLN
INTRAVENOUS | Status: AC
  Filled 2022-02-28: qty 500

## 2022-02-28 MED ORDER — SODIUM CHLORIDE 0.9 % IV SOLN: 250.0000 mL | INTRAVENOUS | Status: DC | PRN

## 2022-02-28 MED ORDER — SODIUM CHLORIDE 0.9% FLUSH: 3.0000 mL | INTRAVENOUS | Status: DC | PRN

## 2022-02-28 MED ORDER — MIDAZOLAM HCL 2 MG/2ML IJ SOLN
INTRAMUSCULAR | Status: AC
  Filled 2022-02-28: qty 2

## 2022-02-28 MED ORDER — MIDAZOLAM HCL 2 MG/2ML IJ SOLN
INTRAMUSCULAR | Status: DC | PRN
Start: 2022-02-28 — End: 2022-03-01
  Administered 2022-02-28: 1 mg via INTRAVENOUS

## 2022-02-28 MED ORDER — ACETAMINOPHEN 325 MG PO TABS: 650.0000 mg | ORAL_TABLET | ORAL | Status: DC | PRN

## 2022-02-28 MED ORDER — SODIUM CHLORIDE 0.9 % IV SOLN: INTRAVENOUS | Status: DC

## 2022-02-28 MED ORDER — LIDOCAINE HCL (PF) 1 % IJ SOLN
INTRAMUSCULAR | Status: DC | PRN
Start: 2022-02-28 — End: 2022-03-01
  Administered 2022-02-28: 15 mL

## 2022-02-28 MED ORDER — LABETALOL HCL 5 MG/ML IV SOLN: 10.0000 mg | INTRAVENOUS | Status: DC | PRN

## 2022-02-28 MED ORDER — SODIUM CHLORIDE 0.9% FLUSH: 3.0000 mL | Freq: Two times a day (BID) | INTRAVENOUS | Status: DC

## 2022-02-28 MED ORDER — FENTANYL CITRATE (PF) 100 MCG/2ML IJ SOLN
INTRAMUSCULAR | Status: AC
  Filled 2022-02-28: qty 2

## 2022-02-28 MED ORDER — HYDRALAZINE HCL 20 MG/ML IJ SOLN: 5.0000 mg | INTRAMUSCULAR | Status: DC | PRN

## 2022-02-28 MED ORDER — ONDANSETRON HCL 4 MG/2ML IJ SOLN: 4.0000 mg | Freq: Four times a day (QID) | INTRAMUSCULAR | Status: DC | PRN

## 2022-02-28 MED ORDER — LIDOCAINE HCL (PF) 1 % IJ SOLN
INTRAMUSCULAR | Status: AC
  Filled 2022-02-28: qty 30

## 2022-02-28 MED ORDER — IODIXANOL 320 MG/ML IV SOLN
INTRAVENOUS | Status: DC | PRN
  Administered 2022-02-28: 125 mL

## 2022-02-28 MED ORDER — FENTANYL CITRATE (PF) 100 MCG/2ML IJ SOLN
INTRAMUSCULAR | Status: DC | PRN
  Administered 2022-02-28: 25 ug via INTRAVENOUS
  Administered 2022-02-28: 50 ug via INTRAVENOUS

## 2022-02-28 SURGICAL SUPPLY — 10 items
CATH OMNI FLUSH 5F 65CM (CATHETERS) ×1 IMPLANT
KIT MICROPUNCTURE NIT STIFF (SHEATH) ×1 IMPLANT
KIT PV (KITS) ×3 IMPLANT
SHEATH PINNACLE 5F 10CM (SHEATH) ×1 IMPLANT
SHEATH PROBE COVER 6X72 (BAG) ×1 IMPLANT
SYR MEDRAD MARK V 150ML (SYRINGE) ×1 IMPLANT
TRANSDUCER W/STOPCOCK (MISCELLANEOUS) ×3 IMPLANT
TRAY PV CATH (CUSTOM PROCEDURE TRAY) ×3 IMPLANT
WIRE BENTSON .035X145CM (WIRE) ×1 IMPLANT
WIRE ROSEN-J .035X180CM (WIRE) ×1 IMPLANT

## 2022-02-28 NOTE — Op Note (Addendum)
    Patient name: Tyrone [name]. MRN: [id number] DOB: [birth year] Sex: male  02/28/2022 Pre-operative Diagnosis: Critical limb ischemia of the right lower extremity with rest pain Post-operative diagnosis:  Same Surgeon:  Marty Heck, MD Procedure Performed: 1.  Ultrasound-guided access left common femoral artery 2.  Aortogram with catheter selection of aorta 3.  Right lower extremity arteriogram with selection of second-order branches 4.  30 minutes of monitored moderate conscious sedation time  Indications: Patient is a 66 year old male well-known to me that has previously undergone a left common femoral to below-knee popliteal bypass for short distance lifestyle limiting claudication with subsequent rest pain.  He has had claudication in the right leg that we have been managing medically.  He presented to the office on Tuesday with significant worsening symptoms in the right foot consistent with rest pain.  His ABIs on the right had dropped to 0.39 and were previously 0.54.  He presents today for aortogram, lower extremity arteriogram likely for bypass after risks and benefits discussed.  Findings:   Aortogram showed a diseased abdominal aorta with patent renal arteries.  His iliac arteries are also calcified and diseased but without flow-limiting stenosis.  The right common femoral artery has about a 70% calcified stenosis in the distal common femoral artery.  The profunda is patent.  The proximal to mid SFA is patent but heavily diseased.  The distal SFA above-knee popliteal artery is occluded.  He reconstitutes popliteal artery just above the knee joint and has a good below-knee popliteal target with two-vessel runoff in the anterior tibial and posterior tibial artery.  The peroneal is patent proximally but then occludes.   Procedure:  The patient was identified in the holding area and taken to room 8.  The patient was then placed supine on the table and prepped and draped in the  usual sterile fashion.  A time out was called.  The patient received versed and fentanyl for moderate sedation.  During sedation we monitored blood pressure, heart rate, respiratory rate, and oxygenation.  I was present for all of sedation.  Ultrasound was used to evaluate the left common femoral artery.  It was patent .  A digital ultrasound image was acquired.  A micropuncture needle was used to access the left common femoral artery under ultrasound guidance.  An 018 wire was advanced without resistance and a micropuncture sheath was placed.  The 018 wire was removed and a benson wire was placed.  The micropuncture sheath was exchanged for a 5 french sheath.  An omniflush catheter was advanced over the wire to the level of L-1.  An abdominal angiogram was obtained.  Next, using the omniflush catheter and a benson wire, the aortic bifurcation was crossed and the catheter was placed into theright external iliac artery and right runoff was obtained.  Pertinent findings are noted above.  Ultimately he will need open surgical intervention with a right common femoral endarterectomy and femoral-popliteal bypass.  Wires and catheters were removed.  He was taken to holding in stable condition for sheath removal from the left groin.  Plan: Patient will be scheduled for right common femoral endarterectomy with a femoropopliteal bypass next week.  Marty Heck, MD [facility] Office: [phone]

## 2022-02-28 NOTE — H&P (Signed)
History and Physical Interval Note:  02/28/2022 12:56 PM  Tyrone Ee.  has presented today for surgery, with the diagnosis of ischemia  with rest pain.  The various methods of treatment have been discussed with the patient and family. After consideration of risks, benefits and other options for treatment, the patient has consented to  Procedure(s): ABDOMINAL AORTOGRAM W/LOWER EXTREMITY (N/A) as a surgical intervention.  The patient's history has been reviewed, patient examined, no change in status, stable for surgery.  I have reviewed the patient's chart and labs.  Questions were answered to the patient's satisfaction.     Tyrone [name]  Patient name: Tyrone [name].    MRN: [id number]        DOB: 1956-09-07          Sex: male   REASON FOR CONSULT: triage visit increasing right leg pain   HPI: Tyrone [name]. is a 66 y.o. male, with history of COPD, hypertension, prostate cancer, tobacco abuse that presents for triage visit for increasing right leg pain.  He was initially evaluated for severe PAD.  He had severe claudication worse in the left leg with some symptoms consistent with rest pain.  ABI of 0.44 on the right and 0.40 on the left at initial evaluation. He had a left leg bypass on 10/24/2021 with a left common femoral endarterectomy and bovine patch and a left common femoral to below knee popliteal artery bypass with PTFE.  We have been trying to manage his right leg claudication medically.   He states over the last week he started having increasing pain in the right foot.  Now the foot is numb at night keeping him awake.  States he has not slept since 3 AM.  Unable to really work to his full capacity.  He has quit smoking which I congratulated him.  No wounds at this time.           Past Medical History:  Diagnosis Date   Arthritis      hands, knees, back   COPD (chronic obstructive pulmonary disease) ([place])     Depression     Diskitis 02/28/2021   Hardware  complicating wound infection ([place]) 02/28/2021   Headache      migraines   Hypertension     Legionella pneumonia ([place]) 02/28/2021   Neuromuscular disorder ([place])      hands   Prostate cancer ([place])     Smoker 11/05/2021   Vaccine counseling 11/05/2021           Past Surgical History:  Procedure Laterality Date   ABDOMINAL AORTOGRAM W/LOWER EXTREMITY Bilateral 10/18/2021    Procedure: ABDOMINAL AORTOGRAM W/LOWER EXTREMITY;  Surgeon: Tyrone Heck, MD;  Location: [facility];  Service: Cardiovascular;  Laterality: Bilateral;   APPENDECTOMY       BACK SURGERY   1980"s    L4 to L 5 laminectomy   COLONOSCOPY WITH PROPOFOL N/A 02/06/2016    Procedure: COLONOSCOPY WITH PROPOFOL;  Surgeon: Garlan Fair, MD;  [facility];  Service: Endoscopy;  Laterality: N/A;   DIAGNOSTIC LAPAROSCOPY       ENDARTERECTOMY FEMORAL Left 10/24/2021    Procedure: ENDARTERECTOMY FEMORAL WITH PROFUNDAPLASTY;  Surgeon: Tyrone Heck, MD;  Location: [place];  Service: Vascular;  Laterality: Left;   FEMORAL-POPLITEAL BYPASS GRAFT Left 10/24/2021    Procedure: LEFT COMMON FEMORAL- BELOW KNEE POPLITEAL BYPASS;  Surgeon: Tyrone Heck, MD;  Location: [place];  Service: Vascular;  Laterality: Left;   LAPAROSCOPIC APPENDECTOMY N/A 08/13/2018    Procedure: APPENDECTOMY LAPAROSCOPIC;  Surgeon: Alphonsa Overall, MD;  [facility];  Service: General;  Laterality: N/A;   PATCH ANGIOPLASTY Left 10/24/2021    Procedure: PATCH ANGIOPLASTY;  Surgeon: Tyrone Heck, MD;  Location: [place];  Service: Vascular;  Laterality: Left;   PELVIC LYMPH NODE DISSECTION Bilateral 02/08/2019    Procedure: PELVIC LYMPH NODE DISSECTION;  Surgeon: Lucas Mallow, MD;  [facility];  Service: Urology;  Laterality: Bilateral;   ROBOT ASSISTED LAPAROSCOPIC RADICAL PROSTATECTOMY N/A 02/08/2019    Procedure: XI ROBOTIC ASSISTED LAPAROSCOPIC RADICAL PROSTATECTOMY;  Surgeon: Lucas Mallow, MD;  [facility];  Service: Urology;  Laterality: N/A;   TRANSFORAMINAL LUMBAR INTERBODY FUSION (TLIF) WITH PEDICLE SCREW FIXATION 1 LEVEL Left 09/12/2020    Procedure: LEFT-SIDED LUMBAR 1 - LUMBAR 2 TRANSFORAMINAL LUMBAR INTERBODY FUSION WITH INSTRUMENTATION AND ALLOGRAFT;  Surgeon: Phylliss Bob, MD;  Location: [facility];  Service: Orthopedics;  Laterality: Left;  3C BED   VEIN HARVEST Left 10/24/2021    Procedure: VEIN HARVEST;  Surgeon: Tyrone Heck, MD;  Location: [facility];  Service: Vascular;  Laterality: Left;   WRIST SURGERY Right      x 2            Family History  Problem Relation Age of Onset   Colon cancer Maternal Grandmother     Colon cancer Maternal Great-grandmother     Prostate cancer Neg Hx        SOCIAL HISTORY: Social History         Socioeconomic History   Marital status: Married      Spouse name: Not on file   Number of children: 6   Years of education: Not on file   Highest education level: Not on file  Occupational History   Occupation: Courty[employer]  Tobacco Use   Smoking status: Every Day      Packs/day: 2.00      Years: 51.00      Pack years: 102.00      Types: Cigarettes   Smokeless tobacco: Never  Vaping Use   Vaping Use: Never used  Substance and Sexual Activity   Alcohol use: Not Currently      Comment: occasional   Drug use: No   Sexual activity: Not Currently  Other Topics Concern   Not on file  Social History Narrative    Married for 74 years with 3 sons and 3 daughters. Smokes a ppd.     Social Determinants of Health    Financial Resource Strain: Not on file  Food Insecurity: Not on file  Transportation Needs: Not on file  Physical Activity: Not on file  Stress: Not on file  Social Connections: Not on file  Intimate Partner Violence: Not on file           Allergies  Allergen Reactions   Neurontin [Gabapentin] Itching and Other (See Comments)      Confusion and sedation.            Current Outpatient Medications  Medication Sig  Dispense Refill   aspirin 325 MG tablet Take 325 mg by mouth every evening.       atorvastatin (LIPITOR) 40 MG tablet Take 1 tablet (40 mg total) by mouth daily. 90 tablet 3   morphine (MS CONTIN) 30 MG 12 hr tablet Take 30 mg by mouth every evening.       morphine (MSIR) 15  MG tablet Take 15 mg by mouth 2 (two) times daily as needed (PAIN.).       Multiple Vitamin (MULTIVITAMIN WITH MINERALS) TABS tablet Take 1 tablet by mouth every evening. Centrum Silver       cefdinir (OMNICEF) 300 MG capsule Take 1 capsule (300 mg total) by mouth 2 (two) times daily. 60 capsule 11   cilostazol (PLETAL) 100 MG tablet Take 1 tablet (100 mg total) by mouth 2 (two) times daily before a meal. 60 tablet 11   doxycycline (VIBRA-TABS) 100 MG tablet Take 1 tablet (100 mg total) by mouth 2 (two) times daily. 60 tablet 11   oxyCODONE-acetaminophen (PERCOCET/ROXICET) 5-325 MG tablet Take 1-2 tablets by mouth every 4 (four) hours as needed (breakthrough pain). 30 tablet 0    No current facility-administered medications for this visit.      REVIEW OF SYSTEMS:  '[X]'$  denotes positive finding, '[ ]'$  denotes negative finding Cardiac   Comments:  Chest pain or chest pressure:      Shortness of breath upon exertion:      Short of breath when lying flat:      Irregular heart rhythm:             Vascular      Pain in calf, thigh, or hip brought on by ambulation:      Pain in feet at night that wakes you up from your sleep:  x Right foot  Blood clot in your veins:      Leg swelling:              Pulmonary      Oxygen at home:      Productive cough:       Wheezing:              Neurologic      Sudden weakness in arms or legs:       Sudden numbness in arms or legs:       Sudden onset of difficulty speaking or slurred speech:      Temporary loss of vision in one eye:       Problems with dizziness:              Gastrointestinal      Blood in stool:       Vomited blood:              Genitourinary      Burning when  urinating:       Blood in urine:             Psychiatric      Major depression:              Hematologic      Bleeding problems:      Problems with blood clotting too easily:             Skin      Rashes or ulcers:             Constitutional      Fever or chills:          PHYSICAL EXAM:    Vitals:    02/26/22 1208  BP: 138/77  Pulse: 66  Resp: 16  Temp: 97.9 F (36.6 C)  TempSrc: Temporal  SpO2: 97%  Weight: 198 lb (89.8 kg)  Height: '5\' 10"'$  (1.778 m)      GENERAL: The patient is a well-nourished male, in no acute distress. The vital signs are documented above.  CARDIAC: There is a regular rate and rhythm.  VASCULAR:  Bilateral femoral pulses palpable Left DP palpable Right pedal pulses nonpalpable but no tissue loss   DATA:    ABIs on the right have dropped from 0.54 on 12/04/21 to 0.39 today/0.95 triphasic on the left     Assessment/Plan:   66 year old male well-known to me that has undergone left common femoral endarterectomy with bovine patch and a left common femoral to below knee popliteal bypass with PTFE on 10/24/2021 for lifestyle limiting claudication with early rest pain.  He is now having worsening symptoms in the right leg consistent with critical limb ischemia with rest pain in the setting of previous claudication.  His ABIs have dropped from 0.54 to 0.39 on the right.  He is really unable to sleep due to his rest pain.  I have offered him aortogram with lower extremity arteriogram given his last imaging was in January of 2023 and anticipate he will likely require femoropopliteal bypass.  Can certainly evaluate if there are any other endovascular options as discussed with him today.  He has a diffusely diseased SFA on the right and an above-knee popliteal occlusion based on prior imaging.   Tyrone Heck, MD [facility] Office: [phone]

## 2022-02-28 NOTE — Progress Notes (Signed)
Site area: left groin    Site Prior to Removal:  Level 0  Pressure Applied For 15 MINUTES    Minutes Beginning at 1505  Manual:   Yes.    Patient Status During Pull:  stable  Post Pull Groin Site:  Level 0  Post Pull Instructions Given:  Yes.    Post Pull Pulses Present:  Yes.    Dressing Applied:  Yes.    Comments:  bed rest started at 1505 X 4 hr.

## 2022-03-01 ENCOUNTER — Encounter (HOSPITAL_COMMUNITY): Payer: Self-pay | Admitting: Vascular Surgery

## 2022-03-05 ENCOUNTER — Other Ambulatory Visit: Payer: Self-pay

## 2022-03-05 ENCOUNTER — Encounter (HOSPITAL_COMMUNITY): Payer: Self-pay | Admitting: Vascular Surgery

## 2022-03-05 NOTE — Progress Notes (Signed)
Tyrone [name] denies chest pin or shortness of breath. Patient denies having any s/s of Covid in his household.  Patient denies any known exposure to Covid.   Dr. Raelyn Ensign is Tyrone [name]'s PCP.   Tyrone [name] has smoked for 51 years, most recently he smoke 2 pkgs a day. Patient stopped smoking April 22,  he is wearing a NTG patch.

## 2022-03-06 ENCOUNTER — Inpatient Hospital Stay (HOSPITAL_COMMUNITY)
Admission: RE | Admit: 2022-03-06 | Discharge: 2022-03-08 | DRG: 271 | Disposition: A | Discharge status: Home / Self Care | Payer: 59 | Attending: Vascular Surgery | Admitting: Vascular Surgery

## 2022-03-06 ENCOUNTER — Encounter (HOSPITAL_COMMUNITY)
Admission: RE | Disposition: A | Discharge status: Home / Self Care | Payer: Self-pay | Source: Home / Self Care | Attending: Vascular Surgery

## 2022-03-06 ENCOUNTER — Inpatient Hospital Stay (HOSPITAL_COMMUNITY): Payer: 59 | Admitting: Anesthesiology

## 2022-03-06 ENCOUNTER — Encounter (HOSPITAL_COMMUNITY): Payer: Self-pay | Admitting: Vascular Surgery

## 2022-03-06 ENCOUNTER — Other Ambulatory Visit: Payer: Self-pay

## 2022-03-06 DIAGNOSIS — Z87891 Personal history of nicotine dependence: Secondary | ICD-10-CM

## 2022-03-06 DIAGNOSIS — J449 Chronic obstructive pulmonary disease, unspecified: Secondary | ICD-10-CM | POA: Diagnosis present

## 2022-03-06 DIAGNOSIS — Z79899 Other long term (current) drug therapy: Secondary | ICD-10-CM

## 2022-03-06 DIAGNOSIS — M1990 Unspecified osteoarthritis, unspecified site: Secondary | ICD-10-CM

## 2022-03-06 DIAGNOSIS — Z79891 Long term (current) use of opiate analgesic: Secondary | ICD-10-CM | POA: Diagnosis not present

## 2022-03-06 DIAGNOSIS — G8929 Other chronic pain: Secondary | ICD-10-CM | POA: Diagnosis present

## 2022-03-06 DIAGNOSIS — I70221 Atherosclerosis of native arteries of extremities with rest pain, right leg: Principal | ICD-10-CM

## 2022-03-06 DIAGNOSIS — Z7982 Long term (current) use of aspirin: Secondary | ICD-10-CM | POA: Diagnosis not present

## 2022-03-06 DIAGNOSIS — D62 Acute posthemorrhagic anemia: Secondary | ICD-10-CM | POA: Diagnosis not present

## 2022-03-06 DIAGNOSIS — I10 Essential (primary) hypertension: Secondary | ICD-10-CM | POA: Diagnosis present

## 2022-03-06 DIAGNOSIS — I739 Peripheral vascular disease, unspecified: Secondary | ICD-10-CM

## 2022-03-06 DIAGNOSIS — Z8546 Personal history of malignant neoplasm of prostate: Secondary | ICD-10-CM | POA: Diagnosis not present

## 2022-03-06 DIAGNOSIS — Z419 Encounter for procedure for purposes other than remedying health state, unspecified: Secondary | ICD-10-CM

## 2022-03-06 DIAGNOSIS — Z888 Allergy status to other drugs, medicaments and biological substances status: Secondary | ICD-10-CM | POA: Diagnosis not present

## 2022-03-06 HISTORY — PX: ENDARTERECTOMY FEMORAL: SHX5804

## 2022-03-06 HISTORY — DX: Peripheral vascular disease, unspecified: I73.9

## 2022-03-06 HISTORY — PX: FEMORAL-POPLITEAL BYPASS GRAFT: SHX937

## 2022-03-06 HISTORY — DX: Prediabetes: R73.03

## 2022-03-06 LAB — COMPREHENSIVE METABOLIC PANEL
ALT: 25 U/L (ref 0–44)
AST: 22 U/L (ref 15–41)
Albumin: 4 g/dL (ref 3.5–5.0)
Alkaline Phosphatase: 88 U/L (ref 38–126)
Anion gap: 9 (ref 5–15)
BUN: 15 mg/dL (ref 8–23)
CO2: 24 mmol/L (ref 22–32)
Calcium: 9.2 mg/dL (ref 8.9–10.3)
Chloride: 106 mmol/L (ref 98–111)
Creatinine, Ser: 0.95 mg/dL (ref 0.61–1.24)
GFR, Estimated: >60 mL/min (ref >60)
Glucose, Bld: 105 mg/dL — ABNORMAL HIGH (ref 70–99)
Potassium: 4.8 mmol/L (ref 3.5–5.1)
Sodium: 139 mmol/L (ref 135–145)
Total Bilirubin: 0.4 mg/dL (ref 0.3–1.2)
Total Protein: 6.9 g/dL (ref 6.5–8.1)

## 2022-03-06 LAB — CBC
HCT: 45.7 % (ref 39.0–52.0)
HCT: 37.4 % — ABNORMAL LOW (ref 39.0–52.0)
Hemoglobin: 15.4 g/dL (ref 13.0–17.0)
Hemoglobin: 12.8 g/dL — ABNORMAL LOW (ref 13.0–17.0)
MCH: 30.1 pg (ref 26.0–34.0)
MCH: 30.1 pg (ref 26.0–34.0)
MCHC: 33.7 g/dL (ref 30.0–36.0)
MCHC: 34.2 g/dL (ref 30.0–36.0)
MCV: 89.4 fL (ref 80.0–100.0)
MCV: 88 fL (ref 80.0–100.0)
Platelets: 266 10*3/uL (ref 150–400)
Platelets: 238 10*3/uL (ref 150–400)
RBC: 5.11 MIL/uL (ref 4.22–5.81)
RBC: 4.25 MIL/uL (ref 4.22–5.81)
RDW: 12.5 % (ref 11.5–15.5)
RDW: 12.7 % (ref 11.5–15.5)
WBC: 7.8 10*3/uL (ref 4.0–10.5)
WBC: 11.5 10*3/uL — ABNORMAL HIGH (ref 4.0–10.5)
nRBC: 0 % (ref 0.0–0.2)
nRBC: 0 % (ref 0.0–0.2)

## 2022-03-06 LAB — POCT ACTIVATED CLOTTING TIME
Activated Clotting Time: 251 seconds
Activated Clotting Time: 299 seconds
Activated Clotting Time: 227 seconds

## 2022-03-06 LAB — APTT: aPTT: 29 seconds (ref 24–36)

## 2022-03-06 LAB — CREATININE, SERUM
Creatinine, Ser: 0.92 mg/dL (ref 0.61–1.24)
GFR, Estimated: >60 mL/min (ref >60)

## 2022-03-06 LAB — PROTIME-INR
INR: 1 (ref 0.8–1.2)
Prothrombin Time: 13.3 seconds (ref 11.4–15.2)

## 2022-03-06 SURGERY — ENDARTERECTOMY, FEMORAL
Anesthesia: General | Site: Groin | Laterality: Right

## 2022-03-06 MED ORDER — PROPOFOL 10 MG/ML IV BOLUS
INTRAVENOUS | Status: AC
  Filled 2022-03-06: qty 20

## 2022-03-06 MED ORDER — HYDROMORPHONE HCL 1 MG/ML IJ SOLN
0.2500 mg | INTRAMUSCULAR | Status: DC | PRN
  Administered 2022-03-06 (×4): 0.5 mg via INTRAVENOUS

## 2022-03-06 MED ORDER — HYDROMORPHONE HCL 1 MG/ML IJ SOLN
0.5000 mg | Freq: Once | INTRAMUSCULAR | Status: AC
  Administered 2022-03-06: 0.5 mg via INTRAVENOUS

## 2022-03-06 MED ORDER — ATORVASTATIN CALCIUM 40 MG PO TABS
40.0000 mg | ORAL_TABLET | Freq: Every day | ORAL | Status: DC
  Administered 2022-03-06 – 2022-03-07 (×2): 40 mg via ORAL
  Filled 2022-03-06 (×2): qty 1

## 2022-03-06 MED ORDER — HEPARIN SODIUM (PORCINE) 5000 UNIT/ML IJ SOLN
5000.0000 [IU] | Freq: Three times a day (TID) | INTRAMUSCULAR | Status: DC
  Administered 2022-03-07 – 2022-03-08 (×4): 5000 [IU] via SUBCUTANEOUS
  Filled 2022-03-06 (×4): qty 1

## 2022-03-06 MED ORDER — LACTATED RINGERS IV SOLN: INTRAVENOUS | Status: DC | PRN

## 2022-03-06 MED ORDER — SODIUM CHLORIDE 0.9 % IV SOLN: 500.0000 mL | Freq: Once | INTRAVENOUS | Status: DC | PRN

## 2022-03-06 MED ORDER — ACETAMINOPHEN 650 MG RE SUPP: 325.0000 mg | RECTAL | Status: DC | PRN

## 2022-03-06 MED ORDER — EPHEDRINE SULFATE-NACL 50-0.9 MG/10ML-% IV SOSY
PREFILLED_SYRINGE | INTRAVENOUS | Status: DC | PRN
  Administered 2022-03-06: 5 mg via INTRAVENOUS

## 2022-03-06 MED ORDER — HYDROMORPHONE HCL 1 MG/ML IJ SOLN
INTRAMUSCULAR | Status: AC
  Filled 2022-03-06: qty 1

## 2022-03-06 MED ORDER — FENTANYL CITRATE (PF) 250 MCG/5ML IJ SOLN
INTRAMUSCULAR | Status: DC | PRN
  Administered 2022-03-06 (×6): 50 ug via INTRAVENOUS
  Administered 2022-03-06: 100 ug via INTRAVENOUS
  Administered 2022-03-06: 50 ug via INTRAVENOUS

## 2022-03-06 MED ORDER — ACETAMINOPHEN 325 MG PO TABS
325.0000 mg | ORAL_TABLET | ORAL | Status: DC | PRN
  Administered 2022-03-07: 650 mg via ORAL
  Filled 2022-03-06: qty 2

## 2022-03-06 MED ORDER — FENTANYL CITRATE (PF) 250 MCG/5ML IJ SOLN
INTRAMUSCULAR | Status: AC
  Filled 2022-03-06: qty 5

## 2022-03-06 MED ORDER — POTASSIUM CHLORIDE CRYS ER 20 MEQ PO TBCR: 20.0000 meq | EXTENDED_RELEASE_TABLET | Freq: Every day | ORAL | Status: DC | PRN

## 2022-03-06 MED ORDER — LABETALOL HCL 5 MG/ML IV SOLN
10.0000 mg | INTRAVENOUS | Status: DC | PRN
  Administered 2022-03-06: 10 mg via INTRAVENOUS
  Filled 2022-03-06 (×2): qty 4

## 2022-03-06 MED ORDER — HYDRALAZINE HCL 20 MG/ML IJ SOLN: 5.0000 mg | INTRAMUSCULAR | Status: DC | PRN

## 2022-03-06 MED ORDER — ONDANSETRON HCL 4 MG/2ML IJ SOLN: 4.0000 mg | Freq: Four times a day (QID) | INTRAMUSCULAR | Status: DC | PRN

## 2022-03-06 MED ORDER — MORPHINE SULFATE ER 15 MG PO TBCR
30.0000 mg | EXTENDED_RELEASE_TABLET | Freq: Two times a day (BID) | ORAL | Status: DC
  Administered 2022-03-06 – 2022-03-08 (×4): 30 mg via ORAL
  Filled 2022-03-06 (×4): qty 2

## 2022-03-06 MED ORDER — PROPOFOL 10 MG/ML IV BOLUS
INTRAVENOUS | Status: DC | PRN
  Administered 2022-03-06: 20 mg via INTRAVENOUS
  Administered 2022-03-06: 100 mg via INTRAVENOUS

## 2022-03-06 MED ORDER — SUGAMMADEX SODIUM 200 MG/2ML IV SOLN
INTRAVENOUS | Status: DC | PRN
  Administered 2022-03-06: 200 mg via INTRAVENOUS

## 2022-03-06 MED ORDER — SODIUM CHLORIDE 0.9 % IV SOLN: INTRAVENOUS | Status: DC

## 2022-03-06 MED ORDER — OXYCODONE HCL 5 MG PO TABS
ORAL_TABLET | ORAL | Status: AC
  Filled 2022-03-06: qty 1

## 2022-03-06 MED ORDER — DEXMEDETOMIDINE (PRECEDEX) IN NS 20 MCG/5ML (4 MCG/ML) IV SYRINGE
PREFILLED_SYRINGE | INTRAVENOUS | Status: AC
  Filled 2022-03-06: qty 5

## 2022-03-06 MED ORDER — MORPHINE SULFATE 15 MG PO TABS
15.0000 mg | ORAL_TABLET | Freq: Four times a day (QID) | ORAL | Status: DC | PRN
  Administered 2022-03-07 – 2022-03-08 (×4): 15 mg via ORAL
  Filled 2022-03-06 (×5): qty 1

## 2022-03-06 MED ORDER — LACTATED RINGERS IV SOLN: INTRAVENOUS | Status: DC

## 2022-03-06 MED ORDER — BISACODYL 10 MG RE SUPP: 10.0000 mg | Freq: Every day | RECTAL | Status: DC | PRN

## 2022-03-06 MED ORDER — PHENOL 1.4 % MT LIQD: 1.0000 | OROMUCOSAL | Status: DC | PRN

## 2022-03-06 MED ORDER — GUAIFENESIN-DM 100-10 MG/5ML PO SYRP: 15.0000 mL | ORAL_SOLUTION | ORAL | Status: DC | PRN

## 2022-03-06 MED ORDER — DEXMEDETOMIDINE (PRECEDEX) IN NS 20 MCG/5ML (4 MCG/ML) IV SYRINGE
PREFILLED_SYRINGE | INTRAVENOUS | Status: DC | PRN
  Administered 2022-03-06: 8 ug via INTRAVENOUS
  Administered 2022-03-06 (×2): 4 ug via INTRAVENOUS

## 2022-03-06 MED ORDER — ONDANSETRON HCL 4 MG/2ML IJ SOLN
INTRAMUSCULAR | Status: AC
  Filled 2022-03-06: qty 2

## 2022-03-06 MED ORDER — LABETALOL HCL 5 MG/ML IV SOLN
INTRAVENOUS | Status: AC
  Filled 2022-03-06: qty 4

## 2022-03-06 MED ORDER — HEPARIN 6000 UNIT IRRIGATION SOLUTION
Status: AC
  Filled 2022-03-06: qty 500

## 2022-03-06 MED ORDER — OXYCODONE HCL 5 MG PO TABS
5.0000 mg | ORAL_TABLET | Freq: Once | ORAL | Status: AC | PRN
  Administered 2022-03-06: 5 mg via ORAL

## 2022-03-06 MED ORDER — 0.9 % SODIUM CHLORIDE (POUR BTL) OPTIME
TOPICAL | Status: DC | PRN
  Administered 2022-03-06: 2000 mL

## 2022-03-06 MED ORDER — CHLORHEXIDINE GLUCONATE CLOTH 2 % EX PADS: 6.0000 | MEDICATED_PAD | Freq: Once | CUTANEOUS | Status: DC

## 2022-03-06 MED ORDER — NICOTINE 14 MG/24HR TD PT24
14.0000 mg | MEDICATED_PATCH | Freq: Every day | TRANSDERMAL | Status: DC
  Administered 2022-03-06 – 2022-03-08 (×3): 14 mg via TRANSDERMAL
  Filled 2022-03-06 (×3): qty 1

## 2022-03-06 MED ORDER — MORPHINE SULFATE (PF) 2 MG/ML IV SOLN
2.0000 mg | INTRAVENOUS | Status: DC | PRN
  Administered 2022-03-06 – 2022-03-07 (×3): 2 mg via INTRAVENOUS
  Filled 2022-03-06 (×4): qty 1

## 2022-03-06 MED ORDER — ROCURONIUM BROMIDE 10 MG/ML (PF) SYRINGE
PREFILLED_SYRINGE | INTRAVENOUS | Status: AC
  Filled 2022-03-06: qty 10

## 2022-03-06 MED ORDER — ALUM & MAG HYDROXIDE-SIMETH 200-200-20 MG/5ML PO SUSP: 15.0000 mL | ORAL | Status: DC | PRN

## 2022-03-06 MED ORDER — HEMOSTATIC AGENTS (NO CHARGE) OPTIME
TOPICAL | Status: DC | PRN
  Administered 2022-03-06: 1

## 2022-03-06 MED ORDER — CEFAZOLIN SODIUM-DEXTROSE 2-4 GM/100ML-% IV SOLN
2.0000 g | INTRAVENOUS | Status: AC
  Administered 2022-03-06: 2 g via INTRAVENOUS
  Filled 2022-03-06: qty 100

## 2022-03-06 MED ORDER — ADULT MULTIVITAMIN W/MINERALS CH
1.0000 | ORAL_TABLET | Freq: Every evening | ORAL | Status: DC
  Administered 2022-03-06 – 2022-03-07 (×2): 1 via ORAL
  Filled 2022-03-06 (×2): qty 1

## 2022-03-06 MED ORDER — PHENYLEPHRINE 80 MCG/ML (10ML) SYRINGE FOR IV PUSH (FOR BLOOD PRESSURE SUPPORT)
PREFILLED_SYRINGE | INTRAVENOUS | Status: AC
  Filled 2022-03-06: qty 10

## 2022-03-06 MED ORDER — MAGNESIUM SULFATE 2 GM/50ML IV SOLN: 2.0000 g | Freq: Every day | INTRAVENOUS | Status: DC | PRN

## 2022-03-06 MED ORDER — CEFAZOLIN SODIUM-DEXTROSE 2-4 GM/100ML-% IV SOLN
2.0000 g | Freq: Three times a day (TID) | INTRAVENOUS | Status: AC
  Administered 2022-03-06 – 2022-03-07 (×2): 2 g via INTRAVENOUS
  Filled 2022-03-06 (×2): qty 100

## 2022-03-06 MED ORDER — MIDAZOLAM HCL 2 MG/2ML IJ SOLN
INTRAMUSCULAR | Status: AC
  Filled 2022-03-06: qty 2

## 2022-03-06 MED ORDER — POLYETHYLENE GLYCOL 3350 17 G PO PACK: 17.0000 g | PACK | Freq: Every day | ORAL | Status: DC | PRN

## 2022-03-06 MED ORDER — PROTAMINE SULFATE 10 MG/ML IV SOLN
INTRAVENOUS | Status: AC
  Filled 2022-03-06: qty 5

## 2022-03-06 MED ORDER — PHENYLEPHRINE 80 MCG/ML (10ML) SYRINGE FOR IV PUSH (FOR BLOOD PRESSURE SUPPORT)
PREFILLED_SYRINGE | INTRAVENOUS | Status: DC | PRN
  Administered 2022-03-06 (×3): 80 ug via INTRAVENOUS
  Administered 2022-03-06: 160 ug via INTRAVENOUS

## 2022-03-06 MED ORDER — LIDOCAINE 2% (20 MG/ML) 5 ML SYRINGE
INTRAMUSCULAR | Status: AC
  Filled 2022-03-06: qty 5

## 2022-03-06 MED ORDER — ROCURONIUM BROMIDE 10 MG/ML (PF) SYRINGE
PREFILLED_SYRINGE | INTRAVENOUS | Status: DC | PRN
  Administered 2022-03-06: 100 mg via INTRAVENOUS

## 2022-03-06 MED ORDER — ORAL CARE MOUTH RINSE
15.0000 mL | Freq: Once | OROMUCOSAL | Status: AC
  Administered 2022-03-06: 15 mL via OROMUCOSAL

## 2022-03-06 MED ORDER — MIDAZOLAM HCL 2 MG/2ML IJ SOLN
INTRAMUSCULAR | Status: DC | PRN
  Administered 2022-03-06: 2 mg via INTRAVENOUS

## 2022-03-06 MED ORDER — HYDROMORPHONE HCL 1 MG/ML IJ SOLN
INTRAMUSCULAR | Status: AC
Start: 2022-03-06 — End: 2022-03-07
  Filled 2022-03-06: qty 1

## 2022-03-06 MED ORDER — ONDANSETRON HCL 4 MG/2ML IJ SOLN
INTRAMUSCULAR | Status: DC | PRN
  Administered 2022-03-06: 4 mg via INTRAVENOUS

## 2022-03-06 MED ORDER — PHENYLEPHRINE HCL-NACL 20-0.9 MG/250ML-% IV SOLN
INTRAVENOUS | Status: DC | PRN
  Administered 2022-03-06: 40 ug/min via INTRAVENOUS

## 2022-03-06 MED ORDER — HEPARIN SODIUM (PORCINE) 1000 UNIT/ML IJ SOLN
INTRAMUSCULAR | Status: DC | PRN
  Administered 2022-03-06: 9000 [IU] via INTRAVENOUS

## 2022-03-06 MED ORDER — HEPARIN 6000 UNIT IRRIGATION SOLUTION
Status: DC | PRN
  Administered 2022-03-06: 1

## 2022-03-06 MED ORDER — OXYCODONE HCL 5 MG/5ML PO SOLN: 5.0000 mg | Freq: Once | ORAL | Status: AC | PRN

## 2022-03-06 MED ORDER — LIDOCAINE 2% (20 MG/ML) 5 ML SYRINGE
INTRAMUSCULAR | Status: DC | PRN
  Administered 2022-03-06: 60 mg via INTRAVENOUS

## 2022-03-06 MED ORDER — OXYMETAZOLINE HCL 0.05 % NA SOLN
1.0000 | Freq: Two times a day (BID) | NASAL | Status: DC | PRN
  Filled 2022-03-06: qty 30

## 2022-03-06 MED ORDER — DEXAMETHASONE SODIUM PHOSPHATE 10 MG/ML IJ SOLN
INTRAMUSCULAR | Status: AC
  Filled 2022-03-06: qty 1

## 2022-03-06 MED ORDER — ASPIRIN 325 MG PO TABS
325.0000 mg | ORAL_TABLET | Freq: Every evening | ORAL | Status: DC
  Administered 2022-03-07: 325 mg via ORAL
  Filled 2022-03-06: qty 1

## 2022-03-06 MED ORDER — DEXAMETHASONE SODIUM PHOSPHATE 10 MG/ML IJ SOLN
INTRAMUSCULAR | Status: DC | PRN
  Administered 2022-03-06: 10 mg via INTRAVENOUS

## 2022-03-06 MED ORDER — PANTOPRAZOLE SODIUM 40 MG PO TBEC
40.0000 mg | DELAYED_RELEASE_TABLET | Freq: Every day | ORAL | Status: DC
  Administered 2022-03-07 – 2022-03-08 (×2): 40 mg via ORAL
  Filled 2022-03-06 (×3): qty 1

## 2022-03-06 MED ORDER — METOPROLOL TARTRATE 5 MG/5ML IV SOLN: 2.0000 mg | INTRAVENOUS | Status: DC | PRN

## 2022-03-06 MED ORDER — ALBUMIN HUMAN 5 % IV SOLN: INTRAVENOUS | Status: DC | PRN

## 2022-03-06 MED ORDER — CHLORHEXIDINE GLUCONATE 0.12 % MT SOLN
15.0000 mL | Freq: Once | OROMUCOSAL | Status: AC
  Filled 2022-03-06: qty 15

## 2022-03-06 MED ORDER — ONDANSETRON HCL 4 MG/2ML IJ SOLN: 4.0000 mg | Freq: Once | INTRAMUSCULAR | Status: DC | PRN

## 2022-03-06 MED ORDER — PROTAMINE SULFATE 10 MG/ML IV SOLN
INTRAVENOUS | Status: DC | PRN
  Administered 2022-03-06: 50 mg via INTRAVENOUS

## 2022-03-06 MED ORDER — DOCUSATE SODIUM 100 MG PO CAPS
100.0000 mg | ORAL_CAPSULE | Freq: Every day | ORAL | Status: DC
  Administered 2022-03-07 – 2022-03-08 (×2): 100 mg via ORAL
  Filled 2022-03-06 (×2): qty 1

## 2022-03-06 MED ORDER — EPHEDRINE 5 MG/ML INJ
INTRAVENOUS | Status: AC
  Filled 2022-03-06: qty 5

## 2022-03-06 MED ORDER — STERILE WATER FOR IRRIGATION IR SOLN
Status: DC | PRN
  Administered 2022-03-06: 1000 mL

## 2022-03-06 SURGICAL SUPPLY — 50 items
ADH SKN CLS APL DERMABOND .7 (GAUZE/BANDAGES/DRESSINGS) ×2
BAG COUNTER SPONGE SURGICOUNT (BAG) ×3 IMPLANT
BAG SPNG CNTER NS LX DISP (BAG) ×1
BANDAGE ESMARK 6X9 LF (GAUZE/BANDAGES/DRESSINGS) IMPLANT
BLADE CLIPPER SURG (BLADE) ×6 IMPLANT
BNDG CMPR 9X6 STRL LF SNTH (GAUZE/BANDAGES/DRESSINGS)
BNDG ESMARK 6X9 LF (GAUZE/BANDAGES/DRESSINGS)
CANISTER SUCT 3000ML PPV (MISCELLANEOUS) ×3 IMPLANT
CLIP FOGARTY SPRING 6M (CLIP) ×3 IMPLANT
CLIP VESOCCLUDE MED 24/CT (CLIP) ×3 IMPLANT
CLIP VESOCCLUDE SM WIDE 24/CT (CLIP) ×3 IMPLANT
COVER PROBE W GEL 5X96 (DRAPES) ×4 IMPLANT
DERMABOND ADVANCED (GAUZE/BANDAGES/DRESSINGS) ×2
DERMABOND ADVANCED .7 DNX12 (GAUZE/BANDAGES/DRESSINGS) ×2 IMPLANT
ELECT REM PT RETURN 9FT ADLT (ELECTROSURGICAL) ×2
ELECTRODE REM PT RTRN 9FT ADLT (ELECTROSURGICAL) ×2 IMPLANT
GAUZE 4X4 16PLY ~~LOC~~+RFID DBL (SPONGE) ×1 IMPLANT
GLOVE BIO SURGEON STRL SZ7.5 (GLOVE) ×3 IMPLANT
GLOVE BIOGEL PI IND STRL 8 (GLOVE) ×2 IMPLANT
GLOVE BIOGEL PI INDICATOR 8 (GLOVE) ×1
GOWN STRL REUS W/ TWL LRG LVL3 (GOWN DISPOSABLE) ×6 IMPLANT
GOWN STRL REUS W/ TWL XL LVL3 (GOWN DISPOSABLE) ×4 IMPLANT
GOWN STRL REUS W/TWL LRG LVL3 (GOWN DISPOSABLE) ×6
GOWN STRL REUS W/TWL XL LVL3 (GOWN DISPOSABLE) ×4
GRAFT PROPATEN W/RING 6X80X60 (Vascular Products) ×1 IMPLANT
HEMOSTAT SNOW SURGICEL 2X4 (HEMOSTASIS) ×1 IMPLANT
INSERT FOGARTY SM (MISCELLANEOUS) ×1 IMPLANT
KIT BASIN OR (CUSTOM PROCEDURE TRAY) ×3 IMPLANT
KIT TURNOVER KIT B (KITS) ×3 IMPLANT
LOOP VESSEL MINI RED (MISCELLANEOUS) ×2 IMPLANT
NS IRRIG 1000ML POUR BTL (IV SOLUTION) ×6 IMPLANT
PACK PERIPHERAL VASCULAR (CUSTOM PROCEDURE TRAY) ×3 IMPLANT
PAD ARMBOARD 7.5X6 YLW CONV (MISCELLANEOUS) ×6 IMPLANT
PATCH VASC XENOSURE 1CMX6CM (Vascular Products) ×2 IMPLANT
PATCH VASC XENOSURE 1X6 (Vascular Products) IMPLANT
PENCIL BUTTON HOLSTER BLD 10FT (ELECTRODE) ×1 IMPLANT
SPONGE T-LAP 18X18 ~~LOC~~+RFID (SPONGE) ×3 IMPLANT
SUT MNCRL AB 4-0 PS2 18 (SUTURE) ×9 IMPLANT
SUT PROLENE 5 0 C 1 24 (SUTURE) ×6 IMPLANT
SUT PROLENE 6 0 BV (SUTURE) ×9 IMPLANT
SUT VIC AB 2-0 CT1 27 (SUTURE) ×6
SUT VIC AB 2-0 CT1 TAPERPNT 27 (SUTURE) ×4 IMPLANT
SUT VIC AB 3-0 SH 18 (SUTURE) ×1 IMPLANT
SUT VIC AB 3-0 SH 27 (SUTURE) ×6
SUT VIC AB 3-0 SH 27X BRD (SUTURE) ×6 IMPLANT
TAPE UMBILICAL 1/8 X36 TWILL (MISCELLANEOUS) ×1 IMPLANT
TOWEL GREEN STERILE (TOWEL DISPOSABLE) ×3 IMPLANT
TRAY FOLEY MTR SLVR 16FR STAT (SET/KITS/TRAYS/PACK) ×3 IMPLANT
UNDERPAD 30X36 HEAVY ABSORB (UNDERPADS AND DIAPERS) ×3 IMPLANT
WATER STERILE IRR 1000ML POUR (IV SOLUTION) ×3 IMPLANT

## 2022-03-06 NOTE — Transfer of Care (Signed)
Immediate Anesthesia Transfer of Care Note  Patient: Tyrone [name].  Procedure(s) Performed: RIGHT COMMON FEMORAL ENDARTERECTOMY (Right: Groin) RIGHT FEMORAL-POPLITEAL BYPASS WITH GORETEC GRAFT (Right: Groin)  Patient Location: PACU  Anesthesia Type:General  Level of Consciousness: awake and alert   Airway & Oxygen Therapy: Patient Spontanous Breathing  Post-op Assessment: Report given to RN and Post -op Vital signs reviewed and stable  Post vital signs: Reviewed and stable  Last Vitals:  Vitals Value Taken Time  BP/ 174/61   Temp    Pulse 66   Resp 18   SpO2 96     Last Pain:  Vitals:   03/06/22 0840  TempSrc:   PainSc: 8       Patients Stated Pain Goal: 0 (03/00/92 3300)  Complications: No notable events documented.

## 2022-03-06 NOTE — H&P (Signed)
History and Physical Interval Note:  03/06/2022 9:34 AM  Tyrone [name].  has presented today for surgery, with the diagnosis of Critical limb ischemia of right lower extremity with rest pain.  The various methods of treatment have been discussed with the patient and family. After consideration of risks, benefits and other options for treatment, the patient has consented to  Procedure(s) with comments: RIGHT COMMON FEMORAL ENDARTERECTOMY (Right) RIGHT FEMORAL-POPLITEAL BYPASS (Right) - INSERT ARTERIAL LINE as a surgical intervention.  The patient's history has been reviewed, patient examined, no change in status, stable for surgery.  I have reviewed the patient's chart and labs.  Questions were answered to the patient's satisfaction.    Right common femoral endarterectomy and right fem pop bypass for CLI with tissue loss.  No vein on previous vein mapping  and discussed high likelihood for prosthetic graft.    Marty Heck  Patient name: Tyrone [name].    MRN: [id number]        DOB: June 29, 1956          Sex: male   REASON FOR CONSULT: triage visit increasing right leg pain   HPI: Tyrone [name]. is a 66 y.o. male, with history of COPD, hypertension, prostate cancer, tobacco abuse that presents for triage visit for increasing right leg pain.  He was initially evaluated for severe PAD.  He had severe claudication worse in the left leg with some symptoms consistent with rest pain.  ABI of 0.44 on the right and 0.40 on the left at initial evaluation. He had a left leg bypass on 10/24/2021 with a left common femoral endarterectomy and bovine patch and a left common femoral to below knee popliteal artery bypass with PTFE.  We have been trying to manage his right leg claudication medically.   He states over the last week he started having increasing pain in the right foot.  Now the foot is numb at night keeping him awake.  States he has not slept since 3 AM.  Unable to really work to his  full capacity.  He has quit smoking which I congratulated him.  No wounds at this time.              Past Medical History:  Diagnosis Date   Arthritis      hands, knees, back   COPD (chronic obstructive pulmonary disease) ([place])     Depression     Diskitis 54/00/8676   Hardware complicating wound infection ([place]) 02/28/2021   Headache      migraines   Hypertension     Legionella pneumonia ([place]) 02/28/2021   Neuromuscular disorder ([place])      hands   Prostate cancer ([place])     Smoker 11/05/2021   Vaccine counseling 11/05/2021               Past Surgical History:  Procedure Laterality Date   ABDOMINAL AORTOGRAM W/LOWER EXTREMITY Bilateral 10/18/2021    Procedure: ABDOMINAL AORTOGRAM W/LOWER EXTREMITY;  Surgeon: Marty Heck, MD;  Location: [facility];  Service: Cardiovascular;  Laterality: Bilateral;   APPENDECTOMY       BACK SURGERY   1980"s    L4 to L 5 laminectomy   COLONOSCOPY WITH PROPOFOL N/A 02/06/2016    Procedure: COLONOSCOPY WITH PROPOFOL;  Surgeon: Garlan Fair, MD;  [facility];  Service: Endoscopy;  Laterality: N/A;   DIAGNOSTIC LAPAROSCOPY       ENDARTERECTOMY FEMORAL Left 10/24/2021  Procedure: ENDARTERECTOMY FEMORAL WITH PROFUNDAPLASTY;  Surgeon: Marty Heck, MD;  Location: [place];  Service: Vascular;  Laterality: Left;   FEMORAL-POPLITEAL BYPASS GRAFT Left 10/24/2021    Procedure: LEFT COMMON FEMORAL- BELOW KNEE POPLITEAL BYPASS;  Surgeon: Marty Heck, MD;  Location: [place];  Service: Vascular;  Laterality: Left;   LAPAROSCOPIC APPENDECTOMY N/A 08/13/2018    Procedure: APPENDECTOMY LAPAROSCOPIC;  Surgeon: Alphonsa Overall, MD;  [facility];  Service: General;  Laterality: N/A;   PATCH ANGIOPLASTY Left 10/24/2021    Procedure: PATCH ANGIOPLASTY;  Surgeon: Marty Heck, MD;  Location: [place];  Service: Vascular;  Laterality: Left;   PELVIC LYMPH NODE DISSECTION Bilateral 02/08/2019    Procedure: PELVIC LYMPH NODE  DISSECTION;  Surgeon: Lucas Mallow, MD;  [facility];  Service: Urology;  Laterality: Bilateral;   ROBOT ASSISTED LAPAROSCOPIC RADICAL PROSTATECTOMY N/A 02/08/2019    Procedure: XI ROBOTIC ASSISTED LAPAROSCOPIC RADICAL PROSTATECTOMY;  Surgeon: Lucas Mallow, MD;  [facility];  Service: Urology;  Laterality: N/A;   TRANSFORAMINAL LUMBAR INTERBODY FUSION (TLIF) WITH PEDICLE SCREW FIXATION 1 LEVEL Left 09/12/2020    Procedure: LEFT-SIDED LUMBAR 1 - LUMBAR 2 TRANSFORAMINAL LUMBAR INTERBODY FUSION WITH INSTRUMENTATION AND ALLOGRAFT;  Surgeon: Phylliss Bob, MD;  Location: [place];  Service: Orthopedics;  Laterality: Left;  3C BED   VEIN HARVEST Left 10/24/2021    Procedure: VEIN HARVEST;  Surgeon: Marty Heck, MD;  Location: [facility];  Service: Vascular;  Laterality: Left;   WRIST SURGERY Right      x 2                Family History  Problem Relation Age of Onset   Colon cancer Maternal Grandmother     Colon cancer Maternal Great-grandmother     Prostate cancer Neg Hx        SOCIAL HISTORY: Social History             Socioeconomic History   Marital status: Married      Spouse name: Not on file   Number of children: 6   Years of education: Not on file   Highest education level: Not on file  Occupational History   Occupation: Cour[employer]  Tobacco Use   Smoking status: Every Day      Packs/day: 2.00      Years: 51.00      Pack years: 102.00      Types: Cigarettes   Smokeless tobacco: Never  Vaping Use   Vaping Use: Never used  Substance and Sexual Activity   Alcohol use: Not Currently      Comment: occasional   Drug use: No   Sexual activity: Not Currently  Other Topics Concern   Not on file  Social History Narrative    Married for 19 years with 3 sons and 3 daughters. Smokes a ppd.     Social Determinants of Health    Financial Resource Strain: Not on file  Food Insecurity: Not on file  Transportation Needs: Not on file  Physical Activity: Not on  file  Stress: Not on file  Social Connections: Not on file  Intimate Partner Violence: Not on file               Allergies  Allergen Reactions   Neurontin [Gabapentin] Itching and Other (See Comments)      Confusion and sedation.  Current Outpatient Medications  Medication Sig Dispense Refill   aspirin 325 MG tablet Take 325 mg by mouth every evening.       atorvastatin (LIPITOR) 40 MG tablet Take 1 tablet (40 mg total) by mouth daily. 90 tablet 3   morphine (MS CONTIN) 30 MG 12 hr tablet Take 30 mg by mouth every evening.       morphine (MSIR) 15 MG tablet Take 15 mg by mouth 2 (two) times daily as needed (PAIN.).       Multiple Vitamin (MULTIVITAMIN WITH MINERALS) TABS tablet Take 1 tablet by mouth every evening. Centrum Silver       cefdinir (OMNICEF) 300 MG capsule Take 1 capsule (300 mg total) by mouth 2 (two) times daily. 60 capsule 11   cilostazol (PLETAL) 100 MG tablet Take 1 tablet (100 mg total) by mouth 2 (two) times daily before a meal. 60 tablet 11   doxycycline (VIBRA-TABS) 100 MG tablet Take 1 tablet (100 mg total) by mouth 2 (two) times daily. 60 tablet 11   oxyCODONE-acetaminophen (PERCOCET/ROXICET) 5-325 MG tablet Take 1-2 tablets by mouth every 4 (four) hours as needed (breakthrough pain). 30 tablet 0    No current facility-administered medications for this visit.      REVIEW OF SYSTEMS:  '[X]'$  denotes positive finding, '[ ]'$  denotes negative finding Cardiac   Comments:  Chest pain or chest pressure:      Shortness of breath upon exertion:      Short of breath when lying flat:      Irregular heart rhythm:             Vascular      Pain in calf, thigh, or hip brought on by ambulation:      Pain in feet at night that wakes you up from your sleep:  x Right foot  Blood clot in your veins:      Leg swelling:              Pulmonary      Oxygen at home:      Productive cough:       Wheezing:              Neurologic      Sudden weakness in arms  or legs:       Sudden numbness in arms or legs:       Sudden onset of difficulty speaking or slurred speech:      Temporary loss of vision in one eye:       Problems with dizziness:              Gastrointestinal      Blood in stool:       Vomited blood:              Genitourinary      Burning when urinating:       Blood in urine:             Psychiatric      Major depression:              Hematologic      Bleeding problems:      Problems with blood clotting too easily:             Skin      Rashes or ulcers:             Constitutional      Fever or chills:  PHYSICAL EXAM:      Vitals:    02/26/22 1208  BP: 138/77  Pulse: 66  Resp: 16  Temp: 97.9 F (36.6 C)  TempSrc: Temporal  SpO2: 97%  Weight: 198 lb (89.8 kg)  Height: '5\' 10"'$  (1.778 m)      GENERAL: The patient is a well-nourished male, in no acute distress. The vital signs are documented above. CARDIAC: There is a regular rate and rhythm.  VASCULAR:  Bilateral femoral pulses palpable Left DP palpable Right pedal pulses nonpalpable but no tissue loss   DATA:    ABIs on the right have dropped from 0.54 on 12/04/21 to 0.39 today/0.95 triphasic on the left     Assessment/Plan:   66 year old male well-known to me that has undergone left common femoral endarterectomy with bovine patch and a left common femoral to below knee popliteal bypass with PTFE on 10/24/2021 for lifestyle limiting claudication with early rest pain.  He is now having worsening symptoms in the right leg consistent with critical limb ischemia with rest pain in the setting of previous claudication.  His ABIs have dropped from 0.54 to 0.39 on the right.  He is really unable to sleep due to his rest pain.  I have offered him aortogram with lower extremity arteriogram given his last imaging was in January of 2023 and anticipate he will likely require femoropopliteal bypass.  Can certainly evaluate if there are any other endovascular options  as discussed with him today.  He has a diffusely diseased SFA on the right and an above-knee popliteal occlusion based on prior imaging.   Marty Heck, MD [facility] Office: [phone]

## 2022-03-06 NOTE — Anesthesia Preprocedure Evaluation (Signed)
Anesthesia Evaluation  Patient identified by MRN, date of birth, ID band Patient awake    Reviewed: Allergy & Precautions, NPO status , Patient's Chart, lab work & pertinent test results, reviewed documented beta blocker date and time   Airway Mallampati: II  TM Distance: >3 FB Neck ROM: Full    Dental  (+) Edentulous Upper, Edentulous Lower   Pulmonary pneumonia, resolved, COPD,  COPD inhaler, Current Smoker, former smoker,    Pulmonary exam normal breath sounds clear to auscultation       Cardiovascular hypertension, + Peripheral Vascular Disease  Normal cardiovascular exam Rhythm:Regular Rate:Normal  Critical limb ischemia right lower extremity with rest pain  EKG 1/523 NSR, Normal   Neuro/Psych  Headaches, PSYCHIATRIC DISORDERS Depression  Neuromuscular disease    GI/Hepatic negative GI ROS, Neg liver ROS,   Endo/Other  negative endocrine ROS  Renal/GU negative Renal ROS   Prostate Ca    Musculoskeletal  (+) Arthritis , Osteoarthritis,    Abdominal Normal abdominal exam  (+)   Peds  Hematology negative hematology ROS (+) Pletal therapy   Anesthesia Other Findings   Reproductive/Obstetrics                             Anesthesia Physical  Anesthesia Plan  ASA: 3  Anesthesia Plan: General   Post-op Pain Management: Ketamine IV*, Dilaudid IV and Precedex   Induction: Intravenous  PONV Risk Score and Plan: 3 and Ondansetron, Dexamethasone and Treatment may vary due to age or medical condition  Airway Management Planned: Oral ETT  Additional Equipment: Arterial line  Intra-op Plan:   Post-operative Plan: Extubation in OR  Informed Consent: I have reviewed the patients History and Physical, chart, labs and discussed the procedure including the risks, benefits and alternatives for the proposed anesthesia with the patient or authorized representative who has indicated his/her  understanding and acceptance.     Dental advisory given  Plan Discussed with: CRNA and Anesthesiologist  Anesthesia Plan Comments:         Anesthesia Quick Evaluation

## 2022-03-06 NOTE — Anesthesia Procedure Notes (Signed)
Arterial Line Insertion Start/End5/24/2023 9:30 AM, 03/06/2022 9:45 AM Performed by: CRNA  Patient location: Pre-op. Preanesthetic checklist: patient identified, IV checked, site marked, risks and benefits discussed, surgical consent, monitors and equipment checked, pre-op evaluation, timeout performed and anesthesia consent Lidocaine 1% used for infiltration Left, radial was placed Catheter size: 20 G Hand hygiene performed  and maximum sterile barriers used   Attempts: 2 Procedure performed without using ultrasound guided technique. Following insertion, Biopatch and dressing applied. Post procedure assessment: normal  Patient tolerated the procedure well with no immediate complications.

## 2022-03-06 NOTE — Op Note (Signed)
Date: Mar 06, 2022  Preoperative diagnosis: Critical limb ischemia of the right lower extremity with rest pain  Postoperative diagnosis: Same  Procedure: 1.  Right common femoral endarterectomy including endarterectomy of the distal external iliac artery as well as the proximal SFA and profunda with bovine pericardial patch angioplasty 2.  Right common femoral artery to below-knee popliteal artery bypass with 6 mm ringed PTFE  Surgeon: Dr. Marty Heck, MD  Assistant: Leontine Locket, PA  Indications: 65 year old male well-known to vascular surgery that has previously undergone a left common femoral endarterectomy with left lower extremity bypass.  We have been managing right lower extremity claudication medically.  He had progressive symptoms and now presents with rest pain with a drop in his ABI to 0.39 in the right foot.  He presents for right common femoral endarterectomy and femoropopliteal bypass after risk benefits discussed.  He has no usable vein on vein mapping.  An assistant was needed for exposure and to assist with sewing the anastomosis.  Findings: Transverse incision in the right groin.  The right common femoral artery was dissected out and then endarterectomy was performed of the distal external iliac artery, the common femoral, as well as the proximal SFA and profunda.  The arteriotomy was taken down onto the proximal profunda during extensive endarterectomy.  A bovine pericardial patch was sewn from the common femoral onto the profunda.  Excellent femoral pulse at completion.  A 6 mm ringed PTFE bypass was sewn from the right common femoral artery to the below-knee popliteal artery.  Palpable PT pulse at completion.  Anesthesia: General  EBL: 100 mL  Details: Patient was taken to the operating room after informed consent was obtained.  Placed on the operative table in supine position.  General endotracheal anesthesia was induced.  The right groin, right leg, and right  foot were all prepped and draped in standard sterile fashion.  Prior to prepping him, I did evaluate his right thigh and calf with [place] and I did not see any long usable saphenous vein for conduit which correlates with his vein mapping and is what I discussed with him preoperatively.  Ultimately timeout was performed.  Antibiotics were given.  Initially made a transverse incision in the right groin and dissected down through the subcutaneous tissue and used cerebellar retractors for added visualization.  The femoral sheath was opened longitudinally and I dissected out the SFA and profunda and control these with Vesseloops as well as dissected out the common femoral and the distal external iliac artery and all these branches were controlled with Vesseloops.  I then went down to the right calf medially and made a longitudinal incision one fingerbreadth posterior to the tibia and then opened the subcutaneous tissue as well as the fascia and entered the below-knee popliteal space.  I then used Meyerding retractors and dissected the below-knee popliteal artery away from the popliteal veins and placed Vesseloops on the below knee popliteal artery.  The artery was quite diseased.  I then used the GORE tunneler and tunneled from the below-knee popliteal artery up to the common femoral artery in a subfascial subsartorial plane.  Patient was given 100 units/kg IV heparin.  ACT was checked to maintain greater than 250.  I then started in the right groin and controlled the SFA with a vessel loop and the profunda with a baby profunda clamp and the distal external iliac artery was controlled with a Henley clamp.  The common femoral artery was circumferentially calcified with no good  pulse and this was opened with 11 blade scalpel extended with Potts scissors.  Endarterectomy was performed with a Garment/textile technologist including endarterectomy of the distal external iliac to common femoral as well as an eversion technique of the proximal  SFA and I was able to feather the plaque in the profunda.  We had excellent backbleeding from the profunda.  The arteriotomy was carried onto the profunda.  The SFA is known to be occluded distal to this.  Bovine pericardial patch was brought on the field and sewn in place with a 5-0 Prolene parachute technique with the help of my assistant.  We de-aired the artery prior to completion.  Excellent femoral pulse at completion.  I then recontrolled the arteries and then opened the bovine pericardial patch with a 11 blade scalpel and Potts scissors on the anterior surface of the common femoral and at this point in time I already tunneled by 6 mm ringed PTFE graft.  This was spatulated and end to side anastomosis was sewn to the right common femoral artery with 5-0 Prolene parachute technique with the help of my assistant.  The graft has excellent pulsatile flow distally.  I then straightened the leg and went and cut the graft to the appropriate length to reach the below-knee popliteal artery that had already been tunneled.  The below-knee popliteal artery was controlled with Vesseloops and opened this with 11 blade scalpel and Potts scissors.  The artery was quite diseased but sewable.  I then spatulated the graft and an end to side  anastomosis was sewn to the below-knee popliteal artery with a 6-0 Prolene parachute technique and the artery was de-aired prior to completion.  I did have to put several repair stitches in the distal anastomosis.  Palpable PT pulse in the foot.  Protamine was given for reversal.  All the incisions were irrigated out and closed with 2-0 Vicryl, 3-0 Vicryl, 4-0 Monocryl and Dermabond.  Taken to recovery in stable condition.  Complication: None  Condition: Stable  Marty Heck, MD [facility] Office: [place]

## 2022-03-06 NOTE — Anesthesia Postprocedure Evaluation (Signed)
Anesthesia Post Note  Patient: Tyrone [name].  Procedure(s) Performed: RIGHT COMMON FEMORAL ENDARTERECTOMY (Right: Groin) RIGHT FEMORAL-POPLITEAL BYPASS WITH GORETEC GRAFT (Right: Groin)     Patient location during evaluation: PACU Anesthesia Type: General Level of consciousness: awake and alert, oriented and patient cooperative Pain management: pain level controlled Vital Signs Assessment: post-procedure vital signs reviewed and stable Respiratory status: spontaneous breathing, nonlabored ventilation and respiratory function stable Cardiovascular status: blood pressure returned to baseline and stable Postop Assessment: no apparent nausea or vomiting Anesthetic complications: no   No notable events documented.  Last Vitals:  Vitals:   03/06/22 0811 03/06/22 1333  BP: (!) 162/63 (!) 179/81  Pulse: 65 78  Resp: 16   Temp: 36.5 C 36.6 C  SpO2: 98%                     [name]

## 2022-03-06 NOTE — Discharge Instructions (Signed)
 [facility]  Discharge instructions  Lower Extremity Bypass Surgery  Please refer to the following instruction for your post-procedure care. Your surgeon or physician assistant will discuss any changes with you.  Activity  You are encouraged to walk as much as you can. You can slowly return to normal activities during the month after your surgery. Avoid strenuous activity and heavy lifting until your doctor tells you it's OK. Avoid activities such as vacuuming or swinging a golf club. Do not drive until your doctor give the OK and you are no longer taking prescription pain medications. It is also normal to have difficulty with sleep habits, eating and bowel movement after surgery. These will go away with time.  Bathing/Showering  Shower daily after you go home. Do not soak in a bathtub, hot tub, or swim until the incision heals completely.  Incision Care  Clean your incision with mild soap and water. Shower every day. Pat the area dry with a clean towel. You do not need a bandage unless otherwise instructed. Do not apply any ointments or creams to your incision. If you have open wounds you will be instructed how to care for them or a visiting nurse may be arranged for you. If you have staples or sutures along your incision they will be removed at your post-op appointment. You may have skin glue on your incision. Do not peel it off. It will come off on its own in about one week.  Wash the groin wound with soap and water daily and pat dry. (No tub bath-only shower)  Then put a dry gauze or washcloth in the groin to keep this area dry to help prevent wound infection.  Do this daily and as needed.  Do not use Vaseline or neosporin on your incisions.  Only use soap and water on your incisions and then protect and keep dry.  Diet  Resume your normal diet. There are no special food restrictions following this procedure. A low fat/ low cholesterol diet is  recommended for all patients with vascular disease. In order to heal from your surgery, it is CRITICAL to get adequate nutrition. Your body requires vitamins, minerals, and protein. Vegetables are the best source of vitamins and minerals. Vegetables also provide the perfect balance of protein. Processed food has little nutritional value, so try to avoid this.  Medications  Resume taking all your medications unless your doctor or physician assistant tells you not to. If your incision is causing pain, you may take over-the-counter pain relievers such as acetaminophen (Tylenol). If you were prescribed a stronger pain medication, please aware these medication can cause nausea and constipation. Prevent nausea by taking the medication with a snack or meal. Avoid constipation by drinking plenty of fluids and eating foods with high amount of fiber, such as fruits, vegetables, and grains. Take Colace 100 mg (an over-the-counter stool softener) twice a day as needed for constipation.  Do not take Tylenol if you are taking prescription pain medications.  Follow Up  Our office will schedule a follow up appointment 2-3 weeks following discharge.  Please call us immediately for any of the following conditions  Severe or worsening pain in your legs or feet while at rest or while walking Increase pain, redness, warmth, or drainage (pus) from your incision site(s) Fever of 101 degree or higher The swelling in your leg with the bypass suddenly worsens and becomes more painful than when you were in the hospital If you have   been instructed to feel your graft pulse then you should do so every day. If you can no longer feel this pulse, call the office immediately. Not all patients are given this instruction.  Leg swelling is common after leg bypass surgery.  The swelling should improve over a few months following surgery. To improve the swelling, you may elevate your legs above the level of your heart while you are  sitting or resting. Your surgeon or physician assistant may ask you to apply an ACE wrap or wear compression (TED) stockings to help to reduce swelling.  Reduce your risk of vascular disease  Stop smoking. If you would like help call [facility] at 1-800-QUIT-NOW (1-800-784-8669) or [place] at 336-586-4000.  Manage your cholesterol Maintain a desired weight Control your diabetes weight Control your diabetes Keep your blood pressure down  If you have any questions, please call the office at [phone]  

## 2022-03-06 NOTE — Plan of Care (Signed)

## 2022-03-06 NOTE — Anesthesia Procedure Notes (Signed)
Procedure Name: Intubation Date/Time: 03/06/2022 10:14 AM Performed by: Lorie Phenix, CRNA Pre-anesthesia Checklist: Patient identified, Emergency Drugs available, Suction available and Patient being monitored Patient Re-evaluated:Patient Re-evaluated prior to induction Oxygen Delivery Method: Circle system utilized Preoxygenation: Pre-oxygenation with 100% oxygen Induction Type: IV induction Ventilation: Mask ventilation without difficulty Laryngoscope Size: Mac and 4 Grade View: Grade I Tube type: Oral Tube size: 7.5 mm Number of attempts: 1 Airway Equipment and Method: Stylet Placement Confirmation: ETT inserted through vocal cords under direct vision, positive ETCO2 and breath sounds checked- equal and bilateral Secured at: 23 cm Tube secured with: Tape Dental Injury: Teeth and Oropharynx as per pre-operative assessment

## 2022-03-06 NOTE — Progress Notes (Signed)
Pt admitted to rm 7 from PACU. CHG wipe given. Initiated tele. VSS oriented pt to the unit. Call bell within reach.   Lavenia Atlas, RN

## 2022-03-07 ENCOUNTER — Encounter (HOSPITAL_COMMUNITY): Payer: Self-pay | Admitting: Vascular Surgery

## 2022-03-07 ENCOUNTER — Other Ambulatory Visit (HOSPITAL_COMMUNITY): Payer: Self-pay

## 2022-03-07 MED ORDER — EZETIMIBE 10 MG PO TABS
10.0000 mg | ORAL_TABLET | Freq: Every day | ORAL | Status: DC
  Administered 2022-03-07 – 2022-03-08 (×2): 10 mg via ORAL
  Filled 2022-03-07 (×2): qty 1

## 2022-03-07 NOTE — TOC Benefit Eligibility Note (Signed)
Patient Teacher, English as a foreign language completed.    The patient is currently admitted and upon discharge could be taking ezetimibe (Zetia) 10 mg.  The current 30 day co-pay is, $0.00.   The patient is insured through [place] [place] Patient [place] Patient Advocate Team Direct Number: [phone]  Fax: [phone]

## 2022-03-07 NOTE — Progress Notes (Signed)
Patient refused lab this am, lab tried once and patient did not allow any more stick. Pt stated" I am freaking tired of needles, its not going to happen anymore. Per patient he is a very hard stick. Gave him sub q heparin this am, he stated he is not going take it anymore either. Patient stated he is not going to be pain cushion anymore. Care ongoing.

## 2022-03-07 NOTE — Evaluation (Signed)
Physical Therapy Evaluation Patient Details Name: Tyrone [name]. MRN: [id number] DOB: 10-Feb-1956 Today's Date: 03/07/2022  History of Present Illness  Pt is a 66 y.o. male who presented 03/06/22 for R common femoral endarterectomy including endarterectomy of the distal external iliac artery as well as the proximal SFA and profunda with bovine pericardial patch angioplasty along with R common femoral artery to below-knee popliteal artery bypass secondary to critical limb ischemia of the R lower extremity. PMH: COPD, hypertension, prostate cancer, tobacco abuse, PAD   Clinical Impression  Pt presents with condition above and deficits mentioned below, see PT Problem List. PTA, he was IND without DME, living with his family in a 2-level house with 1 STE. Currently, pt is primarily limited in R lower extremity weight bearing tolerance and R knee extension AROM by pain from the incisions/surgery. Pt is also reporting slight numbness at his R toes with noted edema. Despite the pain, pt is mobilizing well with a RW for pain management, not needing any physical assistance or displaying any LOB. I suspect he will quickly progress as the pain improves. Thus, I anticipate he will not need PT follow-up at d/c, but will continue to follow acutely to maximize his return to baseline prior to d/c.     Recommendations for follow up therapy are one component of a multi-disciplinary discharge planning process, led by the attending physician.  Recommendations may be updated based on patient status, additional functional criteria and insurance authorization.  Follow Up Recommendations No PT follow up    Assistance Recommended at Discharge PRN  Patient can return home with the following  Help with stairs or ramp for entrance;Assist for transportation;Assistance with cooking/housework    Equipment Recommendations None recommended by PT  Recommendations for Other Services       Functional Status Assessment  Patient has had a recent decline in their functional status and demonstrates the ability to make significant improvements in function in a reasonable and predictable amount of time.     Precautions / Restrictions Precautions Precautions: Fall Restrictions Weight Bearing Restrictions: No      Mobility  Bed Mobility Overal bed mobility: Needs Assistance Bed Mobility: Supine to Sit, Sit to Supine     Supine to sit: Supervision, HOB elevated Sit to supine: Supervision, HOB elevated   General bed mobility comments: Supervision for safety and to cue pt to hook R leg with L for improved ease with lifting leg back onto bed.    Transfers Overall transfer level: Needs assistance Equipment used: Rolling walker (2 wheels) Transfers: Sit to/from Stand Sit to Stand: Supervision           General transfer comment: Pt comes to stand with good power up from EOB, supervision for safety    Ambulation/Gait Ambulation/Gait assistance: Min guard Gait Distance (Feet): 240 Feet Assistive device: Rolling walker (2 wheels) Gait Pattern/deviations: Step-through pattern, Decreased stance time - right, Decreased dorsiflexion - right, Decreased dorsiflexion - left, Trunk flexed, Antalgic Gait velocity: reduced Gait velocity interpretation: <1.8 ft/sec, indicate of risk for recurrent falls   General Gait Details: Pt with slow, mildly antalgic gait pattern, skimming bil feet against ground (R>L) often. No LOB, but 1x pt had instability when R leg "cramped". Cues provided to improve upright posture, good momentary success. Min guard for safety  Stairs            Wheelchair Mobility    Modified Rankin (Stroke Patients Only)       Balance Overall balance assessment:  Needs assistance Sitting-balance support: Feet supported Sitting balance-Leahy Scale: Good     Standing balance support: During functional activity, No upper extremity supported, Bilateral upper extremity supported Standing  balance-Leahy Scale: Fair Standing balance comment: Able to stand statically without UE support but benefits from RW for pain management                             Pertinent Vitals/Pain Pain Assessment Pain Assessment: Faces Faces Pain Scale: Hurts whole lot Pain Location: R leg/groin Pain Descriptors / Indicators: Stabbing, Operative site guarding, Moaning Pain Intervention(s): Monitored during session, Limited activity within patient's tolerance, Premedicated before session, Repositioned    Home Living Family/patient expects to be discharged to:: Private residence Living Arrangements: Spouse/significant other;Other relatives Available Help at Discharge: Family;Available 24 hours/day;Available PRN/intermittently Type of Home: House Home Access: Stairs to enter Entrance Stairs-Rails: None Entrance Stairs-Number of Steps: 1 Alternate Level Stairs-Number of Steps: flight Home Layout: Two level;1/2 bath on main level;Bed/bath upstairs Home Equipment: BSC/3in1;Rollator (4 wheels);Shower seat      Prior Function Prior Level of Function : Independent/Modified Independent;Working/employed;Driving             Mobility Comments: No assistive device needed, but pain limited; began avoiding stairs due to pain ADLs Comments: Works at hotel (involves a lot of walking); Independent with ADLs/IADLs     Hand Dominance   Dominant Hand: Right    Extremity/Trunk Assessment   Upper Extremity Assessment Upper Extremity Assessment: Defer to OT evaluation    Lower Extremity Assessment Lower Extremity Assessment: RLE deficits/detail RLE Deficits / Details: noted incisions from surgery, limited AROM knee extension due to pain, reported decreased sensation to light touch at toes RLE Sensation: decreased light touch (toes)    Cervical / Trunk Assessment Cervical / Trunk Assessment: Kyphotic  Communication   Communication: No difficulties  Cognition Arousal/Alertness:  Awake/alert Behavior During Therapy: WFL for tasks assessed/performed Overall Cognitive Status: Within Functional Limits for tasks assessed                                          General Comments General comments (skin integrity, edema, etc.): encouraged pt to perform AROM of R leg to reduce stiffness    Exercises     Assessment/Plan    PT Assessment Patient needs continued PT services  PT Problem List Decreased range of motion;Decreased activity tolerance;Decreased balance;Decreased mobility;Impaired sensation;Pain;Decreased skin integrity       PT Treatment Interventions DME instruction;Gait training;Stair training;Functional mobility training;Therapeutic activities;Therapeutic exercise;Balance training;Neuromuscular re-education;Patient/family education    PT Goals (Current goals can be found in the Care Plan section)  Acute Rehab PT Goals Patient Stated Goal: to get better PT Goal Formulation: With patient Time For Goal Achievement: 03/21/22 Potential to Achieve Goals: Good    Frequency Min 3X/week     Co-evaluation               AM-PAC PT "6 Clicks" Mobility  Outcome Measure Help needed turning from your back to your side while in a flat bed without using bedrails?: A Little Help needed moving from lying on your back to sitting on the side of a flat bed without using bedrails?: A Little Help needed moving to and from a bed to a chair (including a wheelchair)?: A Little Help needed standing up from a chair using your arms (e.g.,  wheelchair or bedside chair)?: A Little Help needed to walk in hospital room?: A Little Help needed climbing 3-5 steps with a railing? : A Little 6 Click Score: 18    End of Session   Activity Tolerance: Patient tolerated treatment well Patient left: in bed;with call bell/phone within reach Nurse Communication: Mobility status PT Visit Diagnosis: Unsteadiness on feet (R26.81);Other abnormalities of gait and  mobility (R26.89);Difficulty in walking, not elsewhere classified (R26.2);Pain Pain - Right/Left: Right Pain - part of body: Leg    Time: 6122-4497 PT Time Calculation (min) (ACUTE ONLY): 27 min   Charges:   PT Evaluation $PT Eval Moderate Complexity: 1 Mod PT Treatments $Gait Training: 8-22 mins        Moishe Spice, PT, DPT Acute Rehabilitation Services  Pager: [phone] Office: [phone]   Orvan Falconer 03/07/2022, 4:41 PM

## 2022-03-07 NOTE — Progress Notes (Signed)
PHARMACIST LIPID MONITORING   Tyrone [name]. is a 66 y.o. male admitted on 03/06/2022 s/p right common femoral endarterectomy with a right common femoral to below-knee popliteal bypass with PTFE for CLI.  Pharmacy has been consulted to optimize lipid-lowering therapy with the indication of secondary prevention for clinical ASCVD.  Recent Labs: Lipid panel pending  Lipid Panel (last 6 months):   No results found for: CHOL, TRIG, HDL, CHOLHDL, VLDL, LDLCALC, LDLDIRECT  Hepatic function panel (last 6 months):   Lab Results  Component Value Date   AST 22 03/06/2022   ALT 25 03/06/2022   ALKPHOS 88 03/06/2022   BILITOT 0.4 03/06/2022    SCr (since admission):   Serum creatinine: 0.92 mg/dL 03/06/22 1807 Estimated creatinine clearance: 81.6 mL/min  Current therapy and lipid therapy tolerance Current lipid-lowering therapy: atorvastatin '40mg'$   Assessment:   Patient agrees with changes to lipid-lowering therapy. Pt states that he is currently having some leg/knee pain with the atorvastatin '40mg'$  and does not wish to increase to '80mg'$ . He is willing to try Zetia '10mg'$  daily.  Plan:    1.Statin intensity (high intensity recommended for all patients regardless of the LDL):  No statin changes. The patient is already on a high intensity statin.  2.Add ezetimibe '10mg'$  daily  4.Follow-up with:  Primary care provider - Wenda Low, MD  5.Follow-up labs after discharge:  Changes in lipid therapy were made. Check a lipid panel in 8-12 weeks then annually.     Sherlon Handing, PharmD, BCPS Please see amion for complete clinical pharmacist phone list 03/07/2022, 3:33 PM

## 2022-03-07 NOTE — Progress Notes (Addendum)
  Progress Note    03/07/2022 6:42 AM 1 Day Post-Op  Subjective:  says he's lousy this morning.  Refuses anymore lab draws.  Says his leg is sore  Afebrile HR 60's-70's  161'W-960'A systolic 54% RA  Vitals:   03/06/22 2315 03/07/22 0333  BP: (!) 145/63 (!) 136/57  Pulse: 71 69  Resp: 15 16  Temp: 98.1 F (36.7 C) 98.1 F (36.7 C)  SpO2: 93% 97%    Physical Exam: Cardiac:  regular Lungs:  non labored Incisions:  right groin and below knee incisions look good Extremities:  easily palpable right PT pulse   CBC    Component Value Date/Time   WBC 11.5 (H) 03/06/2022 1807   RBC 4.25 03/06/2022 1807   HGB 12.8 (L) 03/06/2022 1807   HCT 37.4 (L) 03/06/2022 1807   PLT 238 03/06/2022 1807   MCV 88.0 03/06/2022 1807   MCH 30.1 03/06/2022 1807   MCHC 34.2 03/06/2022 1807   RDW 12.7 03/06/2022 1807   LYMPHSABS 2,102 11/05/2021 1533   MONOABS 0.7 09/06/2020 0834   EOSABS 177 11/05/2021 1533   BASOSABS 77 11/05/2021 1533    BMET    Component Value Date/Time   NA 139 03/06/2022 0851   K 4.8 03/06/2022 0851   CL 106 03/06/2022 0851   CO2 24 03/06/2022 0851   GLUCOSE 105 (H) 03/06/2022 0851   BUN 15 03/06/2022 0851   CREATININE 0.92 03/06/2022 1807   CREATININE 0.96 11/05/2021 1533   CALCIUM 9.2 03/06/2022 0851   GFRNONAA >60 03/06/2022 1807   GFRNONAA 65 02/28/2021 0916   GFRAA 76 02/28/2021 0916    INR    Component Value Date/Time   INR 1.0 03/06/2022 0851     Intake/Output Summary (Last 24 hours) at 03/07/2022 0981 Last data filed at 03/07/2022 0600 Gross per 24 hour  Intake 3040 ml  Output 2625 ml  Net 415 ml     Assessment/Plan:  66 y.o. male is s/p:  1.  Right common femoral endarterectomy including endarterectomy of the distal external iliac artery as well as the proximal SFA and profunda with bovine pericardial patch angioplasty 2.  Right common femoral artery to below-knee popliteal artery bypass with 6 mm ringed PTFE  1 Day Post-Op   -pt  doing well this morning-he does have a palpable right PT pulse.  -acute surgical blood loss anemia-hgb down to 12.8 from 15.4.  no indication for transfusion.   -oob and mobilize today -DVT prophylaxis:  sq heparin to start this am   Leontine Locket, PA-C [facility] [phone] 03/07/2022 6:42 AM  I have seen and evaluated the patient. I agree with the PA note as documented above.  Postop day 1 status post a right common femoral endarterectomy with a right common femoral to below-knee popliteal bypass with PTFE for CLI with rest pain.  Incisions look great today.  He has a palpable PT pulse at the ankle.  Pain is an ongoing issue and has chronic pain.  MS Contin ordered which is home medication as well as IV medicine for breakthrough and p.o. oxycodone.  Out of bed to chair today.  Aspirin statin for risk reduction  Marty Heck, MD [facility] [place] Office: [phone]

## 2022-03-07 NOTE — Evaluation (Signed)
Occupational Therapy Evaluation Patient Details Name: Tyrone [name]. MRN: [id number] DOB: 07/23/1956 Today's Date: 03/07/2022   History of Present Illness Patient is a 66 y/o male admitted lifestyle limiting claudication in the R LE, pt s/p R fem to below knee popliteal BPG.  PMHx, COPD, HTN, prostate CA, tobacco abuse and severe PAD   Clinical Impression   Patient admitted for the diagnosis and procedure above.  PTA he continues to work full time, and after recovering from his prior bypass and graft on the L leg, needed no assist with ADL/IADL or mobility.  R leg pain did limit his abilities for stairs and prolonged mobility.  Currently pain to the R LE is limiting his independence, but he should progress quickly once pain is better managed.  OT will follow in the acute setting, but no post acute OT is anticipated.        Recommendations for follow up therapy are one component of a multi-disciplinary discharge planning process, led by the attending physician.  Recommendations may be updated based on patient status, additional functional criteria and insurance authorization.   Follow Up Recommendations  No OT follow up    Assistance Recommended at Discharge Intermittent Supervision/Assistance  Patient can return home with the following      Functional Status Assessment  Patient has had a recent decline in their functional status and demonstrates the ability to make significant improvements in function in a reasonable and predictable amount of time.  Equipment Recommendations  None recommended by OT    Recommendations for Other Services       Precautions / Restrictions Precautions Precautions: Fall Restrictions Weight Bearing Restrictions: No      Mobility Bed Mobility Overal bed mobility: Needs Assistance Bed Mobility: Supine to Sit, Sit to Supine     Supine to sit: Supervision Sit to supine: Supervision        Transfers Overall transfer level: Needs  assistance Equipment used: Rolling walker (2 wheels) Transfers: Sit to/from Stand, Bed to chair/wheelchair/BSC Sit to Stand: Supervision     Step pivot transfers: Min guard            Balance Overall balance assessment: Needs assistance Sitting-balance support: Feet supported Sitting balance-Leahy Scale: Good     Standing balance support: Reliant on assistive device for balance Standing balance-Leahy Scale: Poor                             ADL either performed or assessed with clinical judgement   ADL                   Upper Body Dressing : Min guard;Standing   Lower Body Dressing: Moderate assistance;Sit to/from stand   Toilet Transfer: Min guard;Rolling walker (2 wheels);Ambulation                   Vision Baseline Vision/History: 1 Wears glasses Patient Visual Report: No change from baseline       Perception Perception Perception: Not tested   Praxis Praxis Praxis: Not tested    Pertinent Vitals/Pain Pain Assessment Pain Assessment: Faces Faces Pain Scale: Hurts whole lot Pain Location: R leg/groin Pain Descriptors / Indicators: Stabbing Pain Intervention(s): Monitored during session     Hand Dominance Right   Extremity/Trunk Assessment Upper Extremity Assessment Upper Extremity Assessment: Overall WFL for tasks assessed   Lower Extremity Assessment Lower Extremity Assessment: Defer to PT evaluation   Cervical / Trunk Assessment  Cervical / Trunk Assessment: Kyphotic   Communication Communication Communication: No difficulties   Cognition Arousal/Alertness: Awake/alert Behavior During Therapy: WFL for tasks assessed/performed Overall Cognitive Status: Within Functional Limits for tasks assessed                                       General Comments   VSS on RA    Exercises     Shoulder Instructions      Home Living Family/patient expects to be discharged to:: Private residence Living  Arrangements: Spouse/significant other;Other relatives Available Help at Discharge: Family;Available 24 hours/day;Available PRN/intermittently Type of Home: House Home Access: Stairs to enter Entrance Stairs-Number of Steps: 1 Entrance Stairs-Rails: None Home Layout: Two level;1/2 bath on main level;Bed/bath upstairs Alternate Level Stairs-Number of Steps: flight Alternate Level Stairs-Rails: Right;Left Bathroom Shower/Tub: Occupational psychologist: Standard Bathroom Accessibility: Yes How Accessible: Accessible via walker Home Equipment: BSC/3in1;Rollator (4 wheels);Shower seat          Prior Functioning/Environment Prior Level of Function : Independent/Modified Independent;Working/employed;Driving             Mobility Comments: No assistive device needed, but pain limited; began avoiding stairs due to pain ADLs Comments: Works at hotel (involves a lot of walking); Independent with ADLs/IADLs        OT Problem List: Impaired balance (sitting and/or standing);Pain      OT Treatment/Interventions: Self-care/ADL training;Balance training;DME and/or AE instruction    OT Goals(Current goals can be found in the care plan section) Acute Rehab OT Goals Patient Stated Goal: Hurt less, hoping to go home by this weekend OT Goal Formulation: With patient Time For Goal Achievement: 03/21/22 Potential to Achieve Goals: Good ADL Goals Pt Will Perform Upper Body Dressing: with modified independence;sitting Pt Will Perform Lower Body Dressing: with modified independence;sit to/from stand Pt Will Transfer to Toilet: with modified independence;ambulating;regular height toilet  OT Frequency: Min 2X/week    Co-evaluation              AM-PAC OT "6 Clicks" Daily Activity     Outcome Measure Help from another person eating meals?: None Help from another person taking care of personal grooming?: None Help from another person toileting, which includes using toliet, bedpan,  or urinal?: A Little Help from another person bathing (including washing, rinsing, drying)?: A Little Help from another person to put on and taking off regular upper body clothing?: A Little Help from another person to put on and taking off regular lower body clothing?: A Little 6 Click Score: 20   End of Session Equipment Utilized During Treatment: Rolling walker (2 wheels) Nurse Communication: Mobility status  Activity Tolerance: Patient limited by pain Patient left: in bed;with call bell/phone within reach;with nursing/sitter in room;with family/visitor present  OT Visit Diagnosis: Unsteadiness on feet (R26.81);Pain Pain - Right/Left: Right Pain - part of body: Leg                Time: 3710-6269 OT Time Calculation (min): 20 min Charges:  OT General Charges $OT Visit: 1 Visit OT Evaluation $OT Eval Moderate Complexity: 1 Mod  03/07/2022  RP, OTR/L  Acute Rehabilitation Services  Office:  [phone]   Metta Clines 03/07/2022, 11:09 AM

## 2022-03-08 MED ORDER — EZETIMIBE 10 MG PO TABS: 10.0000 mg | ORAL_TABLET | Freq: Every day | ORAL | 4 refills | Status: DC

## 2022-03-08 NOTE — Discharge Summary (Signed)
Discharge Summary     Tyrone [name]. [birth year] 66 y.o. male  [id number]  Admission Date: 03/06/2022  Discharge Date: 03/08/2022  Physician: Marty Heck, MD  Admission Diagnosis: PAD (peripheral artery disease) ([place]) [I73.9]  HPI:   This is a 66 y.o. male with history of COPD, hypertension, prostate cancer, tobacco abuse that presents for triage visit for increasing right leg pain.  He was initially evaluated for severe PAD.  He had severe claudication worse in the left leg with some symptoms consistent with rest pain.  ABI of 0.44 on the right and 0.40 on the left at initial evaluation. He had a left leg bypass on 10/24/2021 with a left common femoral endarterectomy and bovine patch and a left common femoral to below knee popliteal artery bypass with PTFE.  We have been trying to manage his right leg claudication medically.   He states over the last week he started having increasing pain in the right foot.  Now the foot is numb at night keeping him awake.  States he has not slept since 3 AM.  Unable to really work to his full capacity.  He has quit smoking which I congratulated him.  No wounds at this time.  Hospital Course:  The patient was admitted to the hospital and taken to the operating room on 03/06/2022 and underwent: 1.  Right common femoral endarterectomy including endarterectomy of the distal external iliac artery as well as the proximal SFA and profunda with bovine pericardial patch angioplasty 2.  Right common femoral artery to below-knee popliteal artery bypass with 6 mm ringed PTFE    Findings: Transverse incision in the right groin.  The right common femoral artery was dissected out and then endarterectomy was performed of the distal external iliac artery, the common femoral, as well as the proximal SFA and profunda.  The arteriotomy was taken down onto the proximal profunda during extensive endarterectomy.  A bovine pericardial patch was sewn from the  common femoral onto the profunda.  Excellent femoral pulse at completion.  A 6 mm ringed PTFE bypass was sewn from the right common femoral artery to the below-knee popliteal artery.  Palpable PT pulse at completion.  The pt tolerated the procedure well and was transported to the PACU in good condition.   By POD 1, pt had easily palpable right PT pulse and incisions looked fine.   He did have acute surgical blood loss anemia but no indication for transfusion for hgb of 12.8.  His pain is an ongoing issue and has chronic pain.  MS Contin ordered which is home medication as well as IV medicine for breakthrough and p.o. oxycodone.  Out of bed to chair today.  Aspirin statin for risk reduction.  POD 2, continued to have palpable PT pulse in the right.  He did have some pain in his thigh but was soft. Pt was discharged home.     CBC    Component Value Date/Time   WBC 11.5 (H) 03/06/2022 1807   RBC 4.25 03/06/2022 1807   HGB 12.8 (L) 03/06/2022 1807   HCT 37.4 (L) 03/06/2022 1807   PLT 238 03/06/2022 1807   MCV 88.0 03/06/2022 1807   MCH 30.1 03/06/2022 1807   MCHC 34.2 03/06/2022 1807   RDW 12.7 03/06/2022 1807   LYMPHSABS 2,102 11/05/2021 1533   MONOABS 0.7 09/06/2020 0834   EOSABS 177 11/05/2021 1533   BASOSABS 77 11/05/2021 1533    BMET    Component Value Date/Time  NA 139 03/06/2022 0851   K 4.8 03/06/2022 0851   CL 106 03/06/2022 0851   CO2 24 03/06/2022 0851   GLUCOSE 105 (H) 03/06/2022 0851   BUN 15 03/06/2022 0851   CREATININE 0.92 03/06/2022 1807   CREATININE 0.96 11/05/2021 1533   CALCIUM 9.2 03/06/2022 0851   GFRNONAA >60 03/06/2022 1807   GFRNONAA 65 02/28/2021 0916   GFRAA 76 02/28/2021 0916     Discharge Instructions     Discharge patient   Complete by: As directed    Discharge disposition: 01-Home or Self Care   Discharge patient date: 03/08/2022       Discharge Diagnosis:  PAD (peripheral artery disease) ([place]) [I73.9]  Secondary Diagnosis: Patient  Active Problem List   Diagnosis Date Noted   Critical limb ischemia of right lower extremity ([place]) 02/26/2022   Critical limb ischemia of left lower extremity with rest pain ([place]) 01/01/2022   Smoker 11/05/2021   Vaccine counseling 11/05/2021   PAD (peripheral artery disease) ([place]) 10/16/2021   Diskitis 05/39/7673   Hardware complicating wound infection ([place]) 02/28/2021   Legionella pneumonia ([place]) 02/28/2021   Radiculopathy 09/12/2020   Prostate cancer ([place]) 02/08/2019   Malignant neoplasm of prostate ([place]) 12/02/2018   Appendicitis with perforation 08/14/2018   Past Medical History:  Diagnosis Date   Arthritis    hands, knees, back   COPD (chronic obstructive pulmonary disease) ([place])    Depression    Diskitis 41/93/7902   Hardware complicating wound infection ([place]) 02/28/2021   Headache    migraines   Hypertension    Legionella pneumonia ([place]) 02/28/2021   Neuromuscular disorder ([place])    hands   Peripheral vascular disease ([place])    Pre-diabetes    Prostate cancer ([place])    Smoker 11/05/2021   Vaccine counseling 11/05/2021     Allergies as of 03/08/2022       Reactions   Neurontin [gabapentin] Itching, Other (See Comments)   Confusion and sedation.   Celexa [citalopram Hydrobromide]    restless leg/insomnia worse   Topamax [topiramate]    numbness in hand        Medication List     STOP taking these medications    cilostazol 100 MG tablet Commonly known as: PLETAL       TAKE these medications    aspirin 325 MG tablet Take 325 mg by mouth every evening.   atorvastatin 40 MG tablet Commonly known as: LIPITOR Take 1 tablet (40 mg total) by mouth daily. What changed: when to take this   ezetimibe 10 MG tablet Commonly known as: ZETIA Take 1 tablet (10 mg total) by mouth daily.   morphine 15 MG tablet Commonly known as: MSIR Take 15 mg by mouth every 6 (six) hours as needed for moderate pain (PAIN.).   morphine 30 MG 12 hr tablet Commonly  known as: MS CONTIN Take 30 mg by mouth every 12 (twelve) hours.   multivitamin with minerals Tabs tablet Take 1 tablet by mouth every evening. Centrum Silver   nicotine 14 mg/24hr patch Commonly known as: NICODERM CQ - dosed in mg/24 hours Place 14 mg onto the skin daily.   oxymetazoline 0.05 % nasal spray Commonly known as: AFRIN Place 1 spray into both nostrils 2 (two) times daily as needed for congestion.        Discharge Instructions: [facility] Discharge instructions Lower Extremity Bypass Surgery  Please refer to the following instruction for your post-procedure care. Your surgeon  or physician assistant will discuss any changes with you.  Activity  You are encouraged to walk as much as you can. You can slowly return to normal activities during the month after your surgery. Avoid strenuous activity and heavy lifting until your doctor tells you it's OK. Avoid activities such as vacuuming or swinging a golf club. Do not drive until your doctor give the OK and you are no longer taking prescription pain medications. It is also normal to have difficulty with sleep habits, eating and bowel movement after surgery. These will go away with time.  Bathing/Showering  You may shower after you go home. Do not soak in a bathtub, hot tub, or swim until the incision heals completely.  Incision Care  Clean your incision with mild soap and water. Shower every day. Pat the area dry with a clean towel. You do not need a bandage unless otherwise instructed. Do not apply any ointments or creams to your incision. If you have open wounds you will be instructed how to care for them or a visiting nurse may be arranged for you. If you have staples or sutures along your incision they will be removed at your post-op appointment. You may have skin glue on your incision. Do not peel it off. It will come off on its own in about one week.  Wash the groin wound with soap and  water daily and pat dry. (No tub bath-only shower)  Then put a dry gauze or washcloth in the groin to keep this area dry to help prevent wound infection.  Do this daily and as needed.  Do not use Vaseline or neosporin on your incisions.  Only use soap and water on your incisions and then protect and keep dry.  Diet  Resume your normal diet. There are no special food restrictions following this procedure. A low fat/ low cholesterol diet is recommended for all patients with vascular disease. In order to heal from your surgery, it is CRITICAL to get adequate nutrition. Your body requires vitamins, minerals, and protein. Vegetables are the best source of vitamins and minerals. Vegetables also provide the perfect balance of protein. Processed food has little nutritional value, so try to avoid this.  Medications  Resume taking all your medications unless your doctor or Physician Assistant tells you not to. If your incision is causing pain, you may take over-the-counter pain relievers such as acetaminophen (Tylenol). If you were prescribed a stronger pain medication, please aware these medication can cause nausea and constipation. Prevent nausea by taking the medication with a snack or meal. Avoid constipation by drinking plenty of fluids and eating foods with high amount of fiber, such as fruits, vegetables, and grains. Take Colace 100 mg (an over-the-counter stool softener) twice a day as needed for constipation.  Do not take Tylenol if you are taking prescription pain medications.  Follow Up  Our office will schedule a follow up appointment 2-3 weeks following discharge.  Please call us immediately for any of the following conditions  Severe or worsening pain in your legs or feet while at rest or while walking Increase pain, redness, warmth, or drainage (pus) from your incision site(s) Fever of 101 degree or higher The swelling in your leg with the bypass suddenly worsens and becomes more painful than  when you were in the hospital If you have been instructed to feel your graft pulse then you should do so every day. If you can no longer feel this pulse, call the office immediately.  Not all patients are given this instruction.  Leg swelling is common after leg bypass surgery.  The swelling should improve over a few months following surgery. To improve the swelling, you may elevate your legs above the level of your heart while you are sitting or resting. Your surgeon or physician assistant may ask you to apply an ACE wrap or wear compression (TED) stockings to help to reduce swelling.  Reduce your risk of vascular disease  Stop smoking. If you would like help call [facility] at 1-800-QUIT-NOW 252-701-4280) or [facility] at 303-113-4705.  Manage your cholesterol Maintain a desired weight Control your diabetes weight Control your diabetes Keep your blood pressure down  If you have any questions, please call the office at [phone]   Prescriptions given: 1.  Zetia '10mg'$  daily #90 4 refills  Disposition: home  Patient's condition: is Good  Follow up: 1. [facility] in 2-3 weeks   Leontine Locket, PA-C [facility] [phone] 03/08/2022  10:56 AM  - For VQI Registry use ---   Post-op:  Wound infection: No  Graft infection: No  Transfusion: No    If yes, n/a units given New Arrhythmia: No Ipsilateral amputation: No, '[ ]'$  Minor, '[ ]'$  BKA, '[ ]'$  AKA Discharge patency: [x ] Primary, '[ ]'$  Primary assisted, '[ ]'$  Secondary, '[ ]'$  [place] Patency judged by: '[ ]'$  Dopper only, '[ ]'$  Palpable graft pulse, '[x]'$  Palpable distal pulse, '[ ]'$  ABI inc. > 0.15, '[ ]'$  Duplex Discharge ABI: R not done, L not done D/C Ambulatory Status: Ambulatory  Complications: MI: No, '[ ]'$  Troponin only, '[ ]'$  EKG or Clinical CHF: No Resp failure:No, '[ ]'$  Pneumonia, '[ ]'$  Ventilator Chg in renal function: No, '[ ]'$  Inc. Cr > 0.5, '[ ]'$  Temp. Dialysis,  '[ ]'$  Permanent dialysis Stroke: No, '[ ]'$  Minor, '[ ]'$   Major Return to OR: No  Reason for return to OR: '[ ]'$  Bleeding, '[ ]'$  Infection, '[ ]'$  Thrombosis, '[ ]'$  Revision  Discharge medications: Statin use:  yes ASA use:  yes Plavix use:  no Beta blocker use: no CCB use:  No ACEI use:   no ARB use:  no Coumadin use: no

## 2022-03-08 NOTE — Progress Notes (Signed)
PT Cancellation Note  Patient Details Name: Tyrone [name]. MRN: [id number] DOB: 1955/12/26   Cancelled Treatment:    Reason Eval/Treat Not Completed: Other (comment). As PT was arriving, pt was seen being rolled down hall on w/c with RN, discharging hospital.    Moishe Spice, PT, DPT Acute Rehabilitation Services  Pager: [phone] Office: [place] 03/08/2022, 12:28 PM

## 2022-03-08 NOTE — Progress Notes (Addendum)
  Progress Note    03/08/2022 6:44 AM 2 Days Post-Op  Subjective:  c/o sharp pain in his thigh  Afebrile HR 50's-70's  517'G-017'C systolic 94% RA  Vitals:   03/07/22 2310 03/08/22 0404  BP: 126/62 (!) 125/59  Pulse: 62 63  Resp: 17 14  Temp: 98 F (36.7 C) 97.9 F (36.6 C)  SpO2: 96% 97%    Physical Exam: Cardiac:  regular Lungs:  non labored Incisions:  right groin and below knee incisions are healing nicely Extremities:  easily palpable right DP/PT pulses; sensory in tact.  Right calf is soft and non tender   CBC    Component Value Date/Time   WBC 11.5 (H) 03/06/2022 1807   RBC 4.25 03/06/2022 1807   HGB 12.8 (L) 03/06/2022 1807   HCT 37.4 (L) 03/06/2022 1807   PLT 238 03/06/2022 1807   MCV 88.0 03/06/2022 1807   MCH 30.1 03/06/2022 1807   MCHC 34.2 03/06/2022 1807   RDW 12.7 03/06/2022 1807   LYMPHSABS 2,102 11/05/2021 1533   MONOABS 0.7 09/06/2020 0834   EOSABS 177 11/05/2021 1533   BASOSABS 77 11/05/2021 1533    BMET    Component Value Date/Time   NA 139 03/06/2022 0851   K 4.8 03/06/2022 0851   CL 106 03/06/2022 0851   CO2 24 03/06/2022 0851   GLUCOSE 105 (H) 03/06/2022 0851   BUN 15 03/06/2022 0851   CREATININE 0.92 03/06/2022 1807   CREATININE 0.96 11/05/2021 1533   CALCIUM 9.2 03/06/2022 0851   GFRNONAA >60 03/06/2022 1807   GFRNONAA 65 02/28/2021 0916   GFRAA 76 02/28/2021 0916    INR    Component Value Date/Time   INR 1.0 03/06/2022 0851     Intake/Output Summary (Last 24 hours) at 03/08/2022 0644 Last data filed at 03/07/2022 2026 Gross per 24 hour  Intake 320 ml  Output 1350 ml  Net -1030 ml     Assessment/Plan:  66 y.o. male is s/p:  1.  Right common femoral endarterectomy including endarterectomy of the distal external iliac artery as well as the proximal SFA and profunda with bovine pericardial patch angioplasty 2.  Right common femoral artery to below-knee popliteal artery bypass with 6 mm ringed PTFE   2 Days  Post-Op   -pt with patent bypass with easily palpable right DP/PT pulses -discussed with pt that pain in thigh most likely due to tunneling for bypass conduit.   -continue to mobilize -home with pain better controlled. -DVT prophylaxis:  sq heparin   Leontine Locket, PA-C [facility] [phone] 03/08/2022 6:44 AM  I have seen and evaluated the patient. I agree with the PA note as documented above.  Postop day 2 status post right common femoral endarterectomy with a right common femoral to below-knee popliteal bypass with PTFE.  Incisions look great.  He has a palpable PT pulse in the foot.  Some pain in the thigh that should continue to improve and the thigh is soft.  Discussed keeping him 1 more day versus discharge today.  He feels he will be more comfortable at home.  Aspirin statin for risk reduction.  PT recommends no follow-up.  We will see him in 2 to 3 weeks in the office for incision checks.  Marty Heck, MD [facility] [place] Office: [phone]

## 2022-03-10 LAB — BPAM RBC
Blood Product Expiration Date: 202306202359
Blood Product Expiration Date: 202306202359
Unit Type and Rh: 5100
Unit Type and Rh: 5100

## 2022-03-10 LAB — TYPE AND SCREEN
ABO/RH(D): O POS
Antibody Screen: POSITIVE
Unit division: 0
Unit division: 0

## 2022-03-19 ENCOUNTER — Encounter: Payer: Self-pay | Admitting: Vascular Surgery

## 2022-03-19 ENCOUNTER — Telehealth: Payer: Self-pay

## 2022-03-19 ENCOUNTER — Ambulatory Visit (INDEPENDENT_AMBULATORY_CARE_PROVIDER_SITE_OTHER): Payer: 59 | Admitting: Vascular Surgery

## 2022-03-19 VITALS — BP 137/73 | HR 66 | Temp 98.2°F | Resp 18 | Ht 70.0 in | Wt 204.0 lb

## 2022-03-19 DIAGNOSIS — I70222 Atherosclerosis of native arteries of extremities with rest pain, left leg: Secondary | ICD-10-CM

## 2022-03-19 NOTE — Telephone Encounter (Signed)
Patient called requesting a letter to return to work. I spoke with Dr. Carlis Abbott and he stated that he explained to patient we will r/o DVT prior to sending him back to work. His plan is to re-evaluate in 2 weeks & then determine return to work date. Patient voiced his understanding.

## 2022-03-19 NOTE — Progress Notes (Signed)
Patient name: Tyrone [name]. MRN: [id number] DOB: 30-Aug-1956 Sex: male  REASON FOR CONSULT: Post-op check  HPI: Keveon Amsler. is a 66 y.o. male, with history of COPD, hypertension, prostate cancer, tobacco abuse that presents for post-op check.  He was initially evaluated for severe PAD.  He most recently underwent a right common femoral endarterectomy with bovine patch and a right common femoral to below-knee popliteal bypass with PTFE on 03/06/2022 for CLI with rest pain.  Pain in his foot is much better.  He is concerned about swelling in the groin as well as significant swelling in the right leg.    Of note, he also previously had a left leg bypass on 10/24/2021 with a left common femoral endarterectomy and bovine patch and a left common femoral to below knee popliteal artery bypass with PTFE.     Past Medical History:  Diagnosis Date   Arthritis    hands, knees, back   COPD (chronic obstructive pulmonary disease) ([place])    Depression    Diskitis 62/95/2841   Hardware complicating wound infection ([place]) 02/28/2021   Headache    migraines   Hypertension    Legionella pneumonia ([place]) 02/28/2021   Neuromuscular disorder ([place])    hands   Peripheral vascular disease ([place])    Pre-diabetes    Prostate cancer ([place])    Smoker 11/05/2021   Vaccine counseling 11/05/2021    Past Surgical History:  Procedure Laterality Date   ABDOMINAL AORTOGRAM W/LOWER EXTREMITY Bilateral 10/18/2021   Procedure: ABDOMINAL AORTOGRAM W/LOWER EXTREMITY;  Surgeon: Marty Heck, MD;  Location: [facility];  Service: Cardiovascular;  Laterality: Bilateral;   ABDOMINAL AORTOGRAM W/LOWER EXTREMITY Right 02/28/2022   Procedure: ABDOMINAL AORTOGRAM W/LOWER EXTREMITY;  Surgeon: Marty Heck, MD;  Location: [facility];  Service: Cardiovascular;  Laterality: Right;   APPENDECTOMY     BACK SURGERY  1980"s   L4 to L 5 laminectomy   COLONOSCOPY WITH PROPOFOL N/A 02/06/2016    Procedure: COLONOSCOPY WITH PROPOFOL;  Surgeon: Garlan Fair, MD;  [facility];  Service: Endoscopy;  Laterality: N/A;   DIAGNOSTIC LAPAROSCOPY     ENDARTERECTOMY FEMORAL Left 10/24/2021   Procedure: ENDARTERECTOMY FEMORAL WITH PROFUNDAPLASTY;  Surgeon: Marty Heck, MD;  Location: [facility];  Service: Vascular;  Laterality: Left;   ENDARTERECTOMY FEMORAL Right 03/06/2022   Procedure: RIGHT COMMON FEMORAL ENDARTERECTOMY;  Surgeon: Marty Heck, MD;  Location: [facility];  Service: Vascular;  Laterality: Right;   FEMORAL-POPLITEAL BYPASS GRAFT Left 10/24/2021   Procedure: LEFT COMMON FEMORAL- BELOW KNEE POPLITEAL BYPASS;  Surgeon: Marty Heck, MD;  Location: [facility];  Service: Vascular;  Laterality: Left;   FEMORAL-POPLITEAL BYPASS GRAFT Right 03/06/2022   Procedure: RIGHT FEMORAL-POPLITEAL BYPASS WITH GORETEC GRAFT;  Surgeon: Marty Heck, MD;  Location: [facility];  Service: Vascular;  Laterality: Right;  INSERT ARTERIAL LINE   LAPAROSCOPIC APPENDECTOMY N/A 08/13/2018   Procedure: APPENDECTOMY LAPAROSCOPIC;  Surgeon: Alphonsa Overall, MD;  [facility];  Service: General;  Laterality: N/A;   PATCH ANGIOPLASTY Left 10/24/2021   Procedure: PATCH ANGIOPLASTY;  Surgeon: Marty Heck, MD;  Location: [facility];  Service: Vascular;  Laterality: Left;   PELVIC LYMPH NODE DISSECTION Bilateral 02/08/2019   Procedure: PELVIC LYMPH NODE DISSECTION;  Surgeon: Lucas Mallow, MD;  [facility];  Service: Urology;  Laterality: Bilateral;   ROBOT ASSISTED LAPAROSCOPIC RADICAL PROSTATECTOMY N/A 02/08/2019   Procedure: XI ROBOTIC ASSISTED  LAPAROSCOPIC RADICAL PROSTATECTOMY;  Surgeon: Lucas Mallow, MD;  [facility];  Service: Urology;  Laterality: N/A;   TRANSFORAMINAL LUMBAR INTERBODY FUSION (TLIF) WITH PEDICLE SCREW FIXATION 1 LEVEL Left 09/12/2020   Procedure: LEFT-SIDED LUMBAR 1 - LUMBAR 2 TRANSFORAMINAL LUMBAR INTERBODY FUSION WITH INSTRUMENTATION AND ALLOGRAFT;   Surgeon: Phylliss Bob, MD;  Location: [facility];  Service: Orthopedics;  Laterality: Left;  3C BED   VEIN HARVEST Left 10/24/2021   Procedure: VEIN HARVEST;  Surgeon: Marty Heck, MD;  Location: [facility];  Service: Vascular;  Laterality: Left;   WRIST SURGERY Right    x 2     Family History  Problem Relation Age of Onset   Colon cancer Maternal Grandmother    Colon cancer Maternal Great-grandmother    Prostate cancer Neg Hx     SOCIAL HISTORY: Social History   Socioeconomic History   Marital status: Married    Spouse name: Not on file   Number of children: 6   Years of education: Not on file   Highest education level: Not on file  Occupational History   Occupation: [employer]  Tobacco Use   Smoking status: Former    Types: Cigarettes    Quit date: 02/02/2022    Years since quitting: 0.1   Smokeless tobacco: Never   Tobacco comments:    Smoked 2 pkgs a day for 51 years  Vaping Use   Vaping Use: Never used  Substance and Sexual Activity   Alcohol use: Not Currently    Comment: occasional   Drug use: No   Sexual activity: Not Currently  Other Topics Concern   Not on file  Social History Narrative   Married for 24 years with 3 sons and 3 daughters. Smokes a ppd.    Social Determinants of Health   Financial Resource Strain: Not on file  Food Insecurity: Not on file  Transportation Needs: Not on file  Physical Activity: Not on file  Stress: Not on file  Social Connections: Not on file  Intimate Partner Violence: Not on file    Allergies  Allergen Reactions   Neurontin [Gabapentin] Itching and Other (See Comments)    Confusion and sedation.   Celexa [Citalopram Hydrobromide]     restless leg/insomnia worse   Topamax [Topiramate]     numbness in hand    Current Outpatient Medications  Medication Sig Dispense Refill   aspirin 325 MG tablet Take 325 mg by mouth every evening.     atorvastatin (LIPITOR) 40 MG tablet Take 1 tablet (40 mg total) by mouth  daily. (Patient taking differently: Take 40 mg by mouth at bedtime.) 90 tablet 3   ezetimibe (ZETIA) 10 MG tablet Take 1 tablet (10 mg total) by mouth daily. 90 tablet 4   morphine (MS CONTIN) 30 MG 12 hr tablet Take 30 mg by mouth every 12 (twelve) hours.     morphine (MSIR) 15 MG tablet Take 15 mg by mouth every 6 (six) hours as needed for moderate pain (PAIN.).     Multiple Vitamin (MULTIVITAMIN WITH MINERALS) TABS tablet Take 1 tablet by mouth every evening. Centrum Silver     nicotine (NICODERM CQ - DOSED IN MG/24 HOURS) 14 mg/24hr patch Place 14 mg onto the skin daily.     oxymetazoline (AFRIN) 0.05 % nasal spray Place 1 spray into both nostrils 2 (two) times daily as needed for congestion.     No current facility-administered medications for this visit.    REVIEW OF  SYSTEMS:  '[X]'$  denotes positive finding, '[ ]'$  denotes negative finding Cardiac  Comments:  Chest pain or chest pressure:    Shortness of breath upon exertion:    Short of breath when lying flat:    Irregular heart rhythm:        Vascular    Pain in calf, thigh, or hip brought on by ambulation:    Pain in feet at night that wakes you up from your sleep:     Blood clot in your veins:    Leg swelling:  x Right leg      Pulmonary    Oxygen at home:    Productive cough:     Wheezing:         Neurologic    Sudden weakness in arms or legs:     Sudden numbness in arms or legs:     Sudden onset of difficulty speaking or slurred speech:    Temporary loss of vision in one eye:     Problems with dizziness:         Gastrointestinal    Blood in stool:     Vomited blood:         Genitourinary    Burning when urinating:     Blood in urine:        Psychiatric    Major depression:         Hematologic    Bleeding problems:    Problems with blood clotting too easily:        Skin    Rashes or ulcers:        Constitutional    Fever or chills:      PHYSICAL EXAM: Vitals:   03/19/22 0939  BP: 137/73  Pulse: 66   Resp: 18  Temp: 98.2 F (36.8 C)  TempSrc: Temporal  SpO2: 96%  Weight: 204 lb (92.5 kg)  Height: '5\' 10"'$  (1.778 m)    GENERAL: The patient is a well-nourished male, in no acute distress. The vital signs are documented above. CARDIAC: There is a regular rate and rhythm.  VASCULAR:  Right groin incision healing Right below-knee popliteal incision healing Right PT palpable Right leg edema 2+  DATA:   N/A   Assessment/Plan:  65 year old male presents for postop check after right common femoral endarterectomy with bovine patch and a right common femoral to below-knee popliteal bypass with PTFE on 03/06/2022 for CLI with rest pain.  All of his incisions are healing with no signs of infection.  Excellent palpable PT pulse at the ankle.  Does have a fair amount of swelling in the right leg.  I have recommended putting him in a knee-high compression stocking 15 to 20 mmHg to control the swelling.  I will have him come back tomorrow for a DVT study of the right leg.  I will see him in 2 weeks to monitor his progress since he is asking about going back to work but is still having significant pain and postop swelling.  Marty Heck, MD [facility] [place] Office: [phone]

## 2022-03-20 ENCOUNTER — Ambulatory Visit (HOSPITAL_COMMUNITY)
Admission: RE | Admit: 2022-03-20 | Discharge: 2022-03-20 | Disposition: A | Discharge status: Home / Self Care | Payer: 59 | Source: Ambulatory Visit | Attending: Vascular Surgery | Admitting: Vascular Surgery

## 2022-03-20 ENCOUNTER — Other Ambulatory Visit: Payer: Self-pay

## 2022-03-20 DIAGNOSIS — I70221 Atherosclerosis of native arteries of extremities with rest pain, right leg: Secondary | ICD-10-CM

## 2022-04-02 ENCOUNTER — Encounter: Payer: Self-pay | Admitting: Vascular Surgery

## 2022-04-02 ENCOUNTER — Ambulatory Visit (INDEPENDENT_AMBULATORY_CARE_PROVIDER_SITE_OTHER): Payer: 59 | Admitting: Vascular Surgery

## 2022-04-02 VITALS — BP 129/76 | HR 78 | Temp 97.9°F | Resp 16 | Ht 70.0 in | Wt 201.0 lb

## 2022-04-02 DIAGNOSIS — I739 Peripheral vascular disease, unspecified: Secondary | ICD-10-CM

## 2022-04-02 DIAGNOSIS — I70222 Atherosclerosis of native arteries of extremities with rest pain, left leg: Secondary | ICD-10-CM

## 2022-04-02 NOTE — Progress Notes (Signed)
Patient name: Tyrone [name]. MRN: [id number] DOB: 1956-04-30 Sex: male  REASON FOR CONSULT: Ongoing post-op check  HPI: Tyrone Pamer. is a 66 y.o. male, with history of COPD, hypertension, prostate cancer, tobacco abuse that presents for ongoing post-op check.  He was initially evaluated for severe PAD.  He most recently underwent a right common femoral endarterectomy with bovine patch and a right common femoral to below-knee popliteal bypass with PTFE on 03/06/2022 for CLI with rest pain.  When I saw him 2 weeks ago he was having a fair amount of swelling in the right leg.  We did send him for DVT study that showed no DVT.  He did have a hematoma in adjacent to the distal anastomosis.  We put him in a compression stocking.  Overall he is improving.  Of note, he also previously had a left leg bypass on 10/24/2021 with a left common femoral endarterectomy and bovine patch and a left common femoral to below knee popliteal artery bypass with PTFE.     Past Medical History:  Diagnosis Date   Arthritis    hands, knees, back   COPD (chronic obstructive pulmonary disease) ([place])    Depression    Diskitis 08/67/6195   Hardware complicating wound infection ([place]) 02/28/2021   Headache    migraines   Hypertension    Legionella pneumonia ([place]) 02/28/2021   Neuromuscular disorder ([place])    hands   Peripheral vascular disease ([place])    Pre-diabetes    Prostate cancer ([place])    Smoker 11/05/2021   Vaccine counseling 11/05/2021    Past Surgical History:  Procedure Laterality Date   ABDOMINAL AORTOGRAM W/LOWER EXTREMITY Bilateral 10/18/2021   Procedure: ABDOMINAL AORTOGRAM W/LOWER EXTREMITY;  Surgeon: Marty Heck, MD;  Location: [facility];  Service: Cardiovascular;  Laterality: Bilateral;   ABDOMINAL AORTOGRAM W/LOWER EXTREMITY Right 02/28/2022   Procedure: ABDOMINAL AORTOGRAM W/LOWER EXTREMITY;  Surgeon: Marty Heck, MD;  Location: [facility];  Service:  Cardiovascular;  Laterality: Right;   APPENDECTOMY     BACK SURGERY  1980"s   L4 to L 5 laminectomy   COLONOSCOPY WITH PROPOFOL N/A 02/06/2016   Procedure: COLONOSCOPY WITH PROPOFOL;  Surgeon: Garlan Fair, MD;  [facility];  Service: Endoscopy;  Laterality: N/A;   DIAGNOSTIC LAPAROSCOPY     ENDARTERECTOMY FEMORAL Left 10/24/2021   Procedure: ENDARTERECTOMY FEMORAL WITH PROFUNDAPLASTY;  Surgeon: Marty Heck, MD;  Location: [facility];  Service: Vascular;  Laterality: Left;   ENDARTERECTOMY FEMORAL Right 03/06/2022   Procedure: RIGHT COMMON FEMORAL ENDARTERECTOMY;  Surgeon: Marty Heck, MD;  Location: [facility];  Service: Vascular;  Laterality: Right;   FEMORAL-POPLITEAL BYPASS GRAFT Left 10/24/2021   Procedure: LEFT COMMON FEMORAL- BELOW KNEE POPLITEAL BYPASS;  Surgeon: Marty Heck, MD;  Location: [place];  Service: Vascular;  Laterality: Left;   FEMORAL-POPLITEAL BYPASS GRAFT Right 03/06/2022   Procedure: RIGHT FEMORAL-POPLITEAL BYPASS WITH GORETEC GRAFT;  Surgeon: Marty Heck, MD;  Location: [facility] [place];  Service: Vascular;  Laterality: Right;  INSERT ARTERIAL LINE   LAPAROSCOPIC APPENDECTOMY N/A 08/13/2018   Procedure: APPENDECTOMY LAPAROSCOPIC;  Surgeon: Alphonsa Overall, MD;  [facility];  Service: General;  Laterality: N/A;   PATCH ANGIOPLASTY Left 10/24/2021   Procedure: PATCH ANGIOPLASTY;  Surgeon: Marty Heck, MD;  Location: [facility];  Service: Vascular;  Laterality: Left;   PELVIC LYMPH NODE DISSECTION Bilateral 02/08/2019   Procedure: PELVIC LYMPH NODE DISSECTION;  Surgeon: Lucas Mallow, MD;  [facility];  Service: Urology;  Laterality: Bilateral;   ROBOT ASSISTED LAPAROSCOPIC RADICAL PROSTATECTOMY N/A 02/08/2019   Procedure: XI ROBOTIC ASSISTED LAPAROSCOPIC RADICAL PROSTATECTOMY;  Surgeon: Lucas Mallow, MD;  [facility];  Service: Urology;  Laterality: N/A;   TRANSFORAMINAL LUMBAR INTERBODY FUSION (TLIF) WITH PEDICLE SCREW  FIXATION 1 LEVEL Left 09/12/2020   Procedure: LEFT-SIDED LUMBAR 1 - LUMBAR 2 TRANSFORAMINAL LUMBAR INTERBODY FUSION WITH INSTRUMENTATION AND ALLOGRAFT;  Surgeon: Phylliss Bob, MD;  Location: [facility];  Service: Orthopedics;  Laterality: Left;  3C BED   VEIN HARVEST Left 10/24/2021   Procedure: VEIN HARVEST;  Surgeon: Marty Heck, MD;  Location: [facility];  Service: Vascular;  Laterality: Left;   WRIST SURGERY Right    x 2     Family History  Problem Relation Age of Onset   Colon cancer Maternal Grandmother    Colon cancer Maternal Great-grandmother    Prostate cancer Neg Hx     SOCIAL HISTORY: Social History   Socioeconomic History   Marital status: Married    Spouse name: Not on file   Number of children: 6   Years of education: Not on file   Highest education level: Not on file  Occupational History   Occupation: [employer]  Tobacco Use   Smoking status: Former    Types: Cigarettes    Quit date: 02/02/2022    Years since quitting: 0.1   Smokeless tobacco: Never   Tobacco comments:    Smoked 2 pkgs a day for 51 years  Vaping Use   Vaping Use: Never used  Substance and Sexual Activity   Alcohol use: Not Currently    Comment: occasional   Drug use: No   Sexual activity: Not Currently  Other Topics Concern   Not on file  Social History Narrative   Married for 60 years with 3 sons and 3 daughters. Smokes a ppd.    Social Determinants of Health   Financial Resource Strain: Not on file  Food Insecurity: Not on file  Transportation Needs: Not on file  Physical Activity: Not on file  Stress: Not on file  Social Connections: Not on file  Intimate Partner Violence: Not on file    Allergies  Allergen Reactions   Neurontin [Gabapentin] Itching and Other (See Comments)    Confusion and sedation.   Celexa [Citalopram Hydrobromide]     restless leg/insomnia worse   Topamax [Topiramate]     numbness in hand    Current Outpatient Medications  Medication Sig  Dispense Refill   aspirin 325 MG tablet Take 325 mg by mouth every evening.     atorvastatin (LIPITOR) 40 MG tablet Take 1 tablet (40 mg total) by mouth daily. (Patient taking differently: Take 40 mg by mouth at bedtime.) 90 tablet 3   ezetimibe (ZETIA) 10 MG tablet Take 1 tablet (10 mg total) by mouth daily. 90 tablet 4   morphine (MS CONTIN) 30 MG 12 hr tablet Take 30 mg by mouth every 12 (twelve) hours.     morphine (MSIR) 15 MG tablet Take 15 mg by mouth every 6 (six) hours as needed for moderate pain (PAIN.).     Multiple Vitamin (MULTIVITAMIN WITH MINERALS) TABS tablet Take 1 tablet by mouth every evening. Centrum Silver     nicotine (NICODERM CQ - DOSED IN MG/24 HOURS) 14 mg/24hr patch Place 14 mg onto the skin daily.     oxymetazoline (AFRIN) 0.05 % nasal  spray Place 1 spray into both nostrils 2 (two) times daily as needed for congestion.     No current facility-administered medications for this visit.    REVIEW OF SYSTEMS:  '[X]'$  denotes positive finding, '[ ]'$  denotes negative finding Cardiac  Comments:  Chest pain or chest pressure:    Shortness of breath upon exertion:    Short of breath when lying flat:    Irregular heart rhythm:        Vascular    Pain in calf, thigh, or hip brought on by ambulation:    Pain in feet at night that wakes you up from your sleep:     Blood clot in your veins:    Leg swelling:  x Right leg      Pulmonary    Oxygen at home:    Productive cough:     Wheezing:         Neurologic    Sudden weakness in arms or legs:     Sudden numbness in arms or legs:     Sudden onset of difficulty speaking or slurred speech:    Temporary loss of vision in one eye:     Problems with dizziness:         Gastrointestinal    Blood in stool:     Vomited blood:         Genitourinary    Burning when urinating:     Blood in urine:        Psychiatric    Major depression:         Hematologic    Bleeding problems:    Problems with blood clotting too easily:         Skin    Rashes or ulcers:        Constitutional    Fever or chills:      PHYSICAL EXAM: Vitals:   04/02/22 1033  BP: 129/76  Pulse: 78  Resp: 16  Temp: 97.9 F (36.6 C)  TempSrc: Temporal  SpO2: 96%  Weight: 201 lb (91.2 kg)  Height: '5\' 10"'$  (1.778 m)    GENERAL: The patient is a well-nourished male, in no acute distress. The vital signs are documented above. CARDIAC: There is a regular rate and rhythm.  VASCULAR:  Right groin incision healing Right below-knee popliteal incision healing Right DP/PT palpable Right leg edema 2+ - improving  DATA:   N/A   Assessment/Plan:  66 year old male presents for ongoing postop check after right common femoral endarterectomy with bovine patch and a right common femoral to below-knee popliteal bypass with PTFE on 03/06/2022 for CLI with rest pain.  We previously performed a DVT study 2 weeks ago that showed no DVT.  His edema is improving.  We have put him in a compression stocking.  Still has a palpable pulse.  All of his incisions are healing.  We will write him a note today to go back to work in 2 weeks.  I will see him in 3 months with bilateral lower extremity arterial duplex and ABIs for surveillance of both of his lower extremity bypasses.  Discussed he call with questions or concerns.  Marty Heck, MD [facility] [place] Office: [phone]

## 2022-04-05 ENCOUNTER — Other Ambulatory Visit: Payer: Self-pay

## 2022-04-05 DIAGNOSIS — I70223 Atherosclerosis of native arteries of extremities with rest pain, bilateral legs: Secondary | ICD-10-CM

## 2022-04-05 DIAGNOSIS — I739 Peripheral vascular disease, unspecified: Secondary | ICD-10-CM

## 2022-04-09 ENCOUNTER — Encounter (HOSPITAL_COMMUNITY): Payer: Medicare Other

## 2022-04-09 ENCOUNTER — Ambulatory Visit: Payer: Medicare Other | Admitting: Vascular Surgery

## 2022-05-13 ENCOUNTER — Ambulatory Visit: Payer: Medicare Other | Admitting: Infectious Disease

## 2022-07-09 ENCOUNTER — Ambulatory Visit (HOSPITAL_COMMUNITY)
Admission: RE | Admit: 2022-07-09 | Discharge: 2022-07-09 | Disposition: A | Discharge status: Home / Self Care | Payer: 59 | Source: Ambulatory Visit | Attending: Family Medicine | Admitting: Family Medicine

## 2022-07-09 ENCOUNTER — Ambulatory Visit (INDEPENDENT_AMBULATORY_CARE_PROVIDER_SITE_OTHER): Payer: 59 | Admitting: Vascular Surgery

## 2022-07-09 ENCOUNTER — Ambulatory Visit (INDEPENDENT_AMBULATORY_CARE_PROVIDER_SITE_OTHER)
Admission: RE | Admit: 2022-07-09 | Discharge: 2022-07-09 | Disposition: A | Discharge status: Home / Self Care | Payer: 59 | Source: Ambulatory Visit | Attending: Vascular Surgery | Admitting: Vascular Surgery

## 2022-07-09 ENCOUNTER — Encounter: Payer: Self-pay | Admitting: Vascular Surgery

## 2022-07-09 VITALS — BP 166/68 | HR 65 | Temp 98.0°F | Resp 18 | Ht 70.0 in | Wt 200.0 lb

## 2022-07-09 DIAGNOSIS — I739 Peripheral vascular disease, unspecified: Secondary | ICD-10-CM | POA: Insufficient documentation

## 2022-07-09 DIAGNOSIS — I70223 Atherosclerosis of native arteries of extremities with rest pain, bilateral legs: Secondary | ICD-10-CM | POA: Insufficient documentation

## 2022-07-09 DIAGNOSIS — I70222 Atherosclerosis of native arteries of extremities with rest pain, left leg: Secondary | ICD-10-CM | POA: Diagnosis not present

## 2022-07-09 MED ORDER — VARENICLINE TARTRATE 1 MG PO TABS: 1.0000 mg | ORAL_TABLET | Freq: Two times a day (BID) | ORAL | 5 refills | Status: DC

## 2022-07-09 NOTE — Progress Notes (Signed)
Patient name: Tyrone [name]. MRN: [id number] DOB: 24-Oct-1955 Sex: male  REASON FOR CONSULT: 94-monthfollow-up for surveillance of bilateral lower extremity bypass grafts  HPI: WAgapito [name] is a 66y.o. male, with history of COPD, hypertension, prostate cancer, tobacco abuse that presents for 3 month follow-up of his lower extremity bypass grafts.  He was initially evaluated for severe PAD.  He most recently underwent a right common femoral endarterectomy with bovine patch and a right common femoral to below-knee popliteal bypass with PTFE on 03/06/2022 for CLI with rest pain.  He also previously had a left leg bypass on 10/24/2021 with a left common femoral endarterectomy and bovine patch and a left common femoral to below knee popliteal artery bypass with PTFE.    States he has been under significant stress.  Back to smoking 1.5 packs a day.  Cannot tolerate the nicotine patches due to rash.   Past Medical History:  Diagnosis Date   Arthritis    hands, knees, back   COPD (chronic obstructive pulmonary disease) ([facility]    Depression    Diskitis 065/99/3570  Hardware complicating wound infection ([facility] 02/28/2021   Headache    migraines   Hypertension    Legionella pneumonia ([facility] 02/28/2021   Neuromuscular disorder ([facility]    hands   Peripheral vascular disease ([facility]    Pre-diabetes    Prostate cancer ([facility]    Smoker 11/05/2021   Vaccine counseling 11/05/2021    Past Surgical History:  Procedure Laterality Date   ABDOMINAL AORTOGRAM W/LOWER EXTREMITY Bilateral 10/18/2021   Procedure: ABDOMINAL AORTOGRAM W/LOWER EXTREMITY;  Surgeon: CMarty Heck MD;  [facility];  Service: Cardiovascular;  Laterality: Bilateral;   ABDOMINAL AORTOGRAM W/LOWER EXTREMITY Right 02/28/2022   Procedure: ABDOMINAL AORTOGRAM W/LOWER EXTREMITY;  Surgeon: CMarty Heck MD;  [facility];  Service: Cardiovascular;  Laterality: Right;   APPENDECTOMY      BACK SURGERY  1980"s   L4 to L 5 laminectomy   COLONOSCOPY WITH PROPOFOL N/A 02/06/2016   Procedure: COLONOSCOPY WITH PROPOFOL;  Surgeon: MGarlan Fair MD;  [facility];  Service: Endoscopy;  Laterality: N/A;   DIAGNOSTIC LAPAROSCOPY     ENDARTERECTOMY FEMORAL Left 10/24/2021   Procedure: ENDARTERECTOMY FEMORAL WITH PROFUNDAPLASTY;  Surgeon: CMarty Heck MD;  [facility]  Service: Vascular;  Laterality: Left;   ENDARTERECTOMY FEMORAL Right 03/06/2022   Procedure: RIGHT COMMON FEMORAL ENDARTERECTOMY;  Surgeon: CMarty Heck MD;  [facility]  Service: Vascular;  Laterality: Right;   FEMORAL-POPLITEAL BYPASS GRAFT Left 10/24/2021   Procedure: LEFT COMMON FEMORAL- BELOW KNEE POPLITEAL BYPASS;  Surgeon: CMarty Heck MD;  [facility]  Service: Vascular;  Laterality: Left;   FEMORAL-POPLITEAL BYPASS GRAFT Right 03/06/2022   Procedure: RIGHT FEMORAL-POPLITEAL BYPASS WITH GORETEC GRAFT;  Surgeon: CMarty Heck MD;  [facility]  Service: Vascular;  Laterality: Right;  INSERT ARTERIAL LINE   LAPAROSCOPIC APPENDECTOMY N/A 08/13/2018   Procedure: APPENDECTOMY LAPAROSCOPIC;  Surgeon: NAlphonsa Overall MD;  [facility];  Service: General;  Laterality: N/A;   PATCH ANGIOPLASTY Left 10/24/2021   Procedure: PATCH ANGIOPLASTY;  Surgeon: CMarty Heck MD;  [facility] [facility]  Service: Vascular;  Laterality: Left;   PELVIC LYMPH NODE DISSECTION Bilateral 02/08/2019   Procedure: PELVIC LYMPH NODE DISSECTION;  Surgeon: BLucas Mallow MD;  [facility];  Service: Urology;  Laterality: Bilateral;   ROBOT ASSISTED LAPAROSCOPIC  RADICAL PROSTATECTOMY N/A 02/08/2019   Procedure: XI ROBOTIC ASSISTED LAPAROSCOPIC RADICAL PROSTATECTOMY;  Surgeon: Lucas Mallow, MD;  [facility];  Service: Urology;  Laterality: N/A;   TRANSFORAMINAL LUMBAR INTERBODY FUSION (TLIF) WITH PEDICLE SCREW FIXATION 1 LEVEL Left 09/12/2020   Procedure: LEFT-SIDED  LUMBAR 1 - LUMBAR 2 TRANSFORAMINAL LUMBAR INTERBODY FUSION WITH INSTRUMENTATION AND ALLOGRAFT;  Surgeon: Phylliss Bob, MD;  Location: [facility];  Service: Orthopedics;  Laterality: Left;  3C BED   VEIN HARVEST Left 10/24/2021   Procedure: VEIN HARVEST;  Surgeon: Marty Heck, MD;  [facility];  Service: Vascular;  Laterality: Left;   WRIST SURGERY Right    x 2     Family History  Problem Relation Age of Onset   Colon cancer Maternal Grandmother    Colon cancer Maternal Great-grandmother    Prostate cancer Neg Hx     SOCIAL HISTORY: Social History   Socioeconomic History   Marital status: Married    Spouse name: Not on file   Number of children: 6   Years of education: Not on file   Highest education level: Not on file  Occupational History   Occupation: Cour[employer]  Tobacco Use   Smoking status: Former    Types: Cigarettes    Quit date: 02/02/2022    Years since quitting: 0.4   Smokeless tobacco: Never   Tobacco comments:    Smoked 2 pkgs a day for 51 years  Vaping Use   Vaping Use: Never used  Substance and Sexual Activity   Alcohol use: Not Currently    Comment: occasional   Drug use: No   Sexual activity: Not Currently  Other Topics Concern   Not on file  Social History Narrative   Married for 89 years with 3 sons and 3 daughters. Smokes a ppd.    Social Determinants of Health   Financial Resource Strain: Not on file  Food Insecurity: Not on file  Transportation Needs: Not on file  Physical Activity: Not on file  Stress: Not on file  Social Connections: Not on file  Intimate Partner Violence: Not on file    Allergies  Allergen Reactions   Neurontin [Gabapentin] Itching and Other (See Comments)    Confusion and sedation.   Celexa [Citalopram Hydrobromide]     restless leg/insomnia worse   Topamax [Topiramate]     numbness in hand    Current Outpatient Medications  Medication Sig Dispense Refill   aspirin 325 MG tablet Take 325 mg by mouth  every evening.     atorvastatin (LIPITOR) 40 MG tablet Take 1 tablet (40 mg total) by mouth daily. (Patient taking differently: Take 40 mg by mouth at bedtime.) 90 tablet 3   ezetimibe (ZETIA) 10 MG tablet Take 1 tablet (10 mg total) by mouth daily. 90 tablet 4   morphine (MS CONTIN) 30 MG 12 hr tablet Take 30 mg by mouth every 12 (twelve) hours.     morphine (MSIR) 15 MG tablet Take 15 mg by mouth every 6 (six) hours as needed for moderate pain (PAIN.).     Multiple Vitamin (MULTIVITAMIN WITH MINERALS) TABS tablet Take 1 tablet by mouth every evening. Centrum Silver     nicotine (NICODERM CQ - DOSED IN MG/24 HOURS) 14 mg/24hr patch Place 14 mg onto the skin daily.     oxymetazoline (AFRIN) 0.05 % nasal spray Place 1 spray into both nostrils 2 (two) times daily as needed for congestion.     No current  facility-administered medications for this visit.    REVIEW OF SYSTEMS:  '[X]'$  denotes positive finding, '[ ]'$  denotes negative finding Cardiac  Comments:  Chest pain or chest pressure:    Shortness of breath upon exertion:    Short of breath when lying flat:    Irregular heart rhythm:        Vascular    Pain in calf, thigh, or hip brought on by ambulation:    Pain in feet at night that wakes you up from your sleep:     Blood clot in your veins:    Leg swelling:         Pulmonary    Oxygen at home:    Productive cough:     Wheezing:         Neurologic    Sudden weakness in arms or legs:     Sudden numbness in arms or legs:     Sudden onset of difficulty speaking or slurred speech:    Temporary loss of vision in one eye:     Problems with dizziness:         Gastrointestinal    Blood in stool:     Vomited blood:         Genitourinary    Burning when urinating:     Blood in urine:        Psychiatric    Major depression:         Hematologic    Bleeding problems:    Problems with blood clotting too easily:        Skin    Rashes or ulcers:        Constitutional    Fever or  chills:      PHYSICAL EXAM: Vitals:   07/09/22 0906  BP: (!) 166/68  Pulse: 65  Resp: 18  Temp: 98 F (36.7 C)  TempSrc: Temporal  SpO2: 96%  Weight: 200 lb (90.7 kg)  Height: '5\' 10"'$  (1.778 m)    GENERAL: The patient is a well-nourished male, in no acute distress. The vital signs are documented above. CARDIAC: There is a regular rate and rhythm.  VASCULAR:  Bilateral femoral pulses palpable Bilateral DP pulses palpable  DATA:   ABIs today are 1.07 on the right triphasic and 1.01 on the left triphasic   Bilateral lower extremity bypass grafts are widely patent   Assessment/Plan:  66 year old male presents for 3 month follow-up of his bilateral lower extremity bypass grafts.  He underwent a right common femoral endarterectomy with bovine patch and a right common femoral to below-knee popliteal bypass with PTFE on 03/06/2022 for CLI with rest pain.  He has also had a left leg bypass as well.  Bilateral lower extremity bypass grafts are widely patent by duplex today and ABI's normal.  He is back to smoking 1.5 packs/day.  I stressed the importance of smoking cessation and discussed smoking will limit the durability of his bypass grafts.  I will try putting him on Chantix since he cannot tolerate the nicotine patches.  I discussed walking therapies.  Discussed aspirin statin for risk reduction.  I will see him in 6 months with bilateral lower extremity arterial duplex and ABIs.  Marty Heck, MD [facility] Office: [phone]

## 2022-07-10 ENCOUNTER — Other Ambulatory Visit: Payer: Self-pay

## 2022-07-10 DIAGNOSIS — I70223 Atherosclerosis of native arteries of extremities with rest pain, bilateral legs: Secondary | ICD-10-CM

## 2022-07-10 DIAGNOSIS — I739 Peripheral vascular disease, unspecified: Secondary | ICD-10-CM

## 2022-12-31 ENCOUNTER — Encounter (HOSPITAL_BASED_OUTPATIENT_CLINIC_OR_DEPARTMENT_OTHER): Payer: Self-pay

## 2022-12-31 ENCOUNTER — Emergency Department (HOSPITAL_BASED_OUTPATIENT_CLINIC_OR_DEPARTMENT_OTHER)
Admission: EM | Admit: 2022-12-31 | Discharge: 2022-12-31 | Disposition: A | Discharge status: Home / Self Care | Payer: 59 | Attending: Emergency Medicine | Admitting: Emergency Medicine

## 2022-12-31 ENCOUNTER — Other Ambulatory Visit: Payer: Self-pay

## 2022-12-31 ENCOUNTER — Emergency Department (HOSPITAL_BASED_OUTPATIENT_CLINIC_OR_DEPARTMENT_OTHER): Payer: 59 | Admitting: Radiology

## 2022-12-31 DIAGNOSIS — R0602 Shortness of breath: Secondary | ICD-10-CM | POA: Diagnosis present

## 2022-12-31 DIAGNOSIS — U071 COVID-19: Secondary | ICD-10-CM | POA: Diagnosis not present

## 2022-12-31 DIAGNOSIS — Z7982 Long term (current) use of aspirin: Secondary | ICD-10-CM | POA: Insufficient documentation

## 2022-12-31 DIAGNOSIS — Z8546 Personal history of malignant neoplasm of prostate: Secondary | ICD-10-CM | POA: Diagnosis not present

## 2022-12-31 DIAGNOSIS — J449 Chronic obstructive pulmonary disease, unspecified: Secondary | ICD-10-CM | POA: Diagnosis not present

## 2022-12-31 DIAGNOSIS — J1282 Pneumonia due to coronavirus disease 2019: Secondary | ICD-10-CM | POA: Diagnosis not present

## 2022-12-31 DIAGNOSIS — Z87891 Personal history of nicotine dependence: Secondary | ICD-10-CM | POA: Insufficient documentation

## 2022-12-31 LAB — CBC
HCT: 45.6 % (ref 39.0–52.0)
Hemoglobin: 15.9 g/dL (ref 13.0–17.0)
MCH: 30.5 pg (ref 26.0–34.0)
MCHC: 34.9 g/dL (ref 30.0–36.0)
MCV: 87.5 fL (ref 80.0–100.0)
Platelets: 203 10*3/uL (ref 150–400)
RBC: 5.21 MIL/uL (ref 4.22–5.81)
RDW: 12 % (ref 11.5–15.5)
WBC: 4.9 10*3/uL (ref 4.0–10.5)
nRBC: 0 % (ref 0.0–0.2)

## 2022-12-31 LAB — BASIC METABOLIC PANEL
Anion gap: 9 (ref 5–15)
BUN: 14 mg/dL (ref 8–23)
CO2: 28 mmol/L (ref 22–32)
Calcium: 9 mg/dL (ref 8.9–10.3)
Chloride: 99 mmol/L (ref 98–111)
Creatinine, Ser: 0.88 mg/dL (ref 0.61–1.24)
GFR, Estimated: >60 mL/min (ref >60)
Glucose, Bld: 127 mg/dL — ABNORMAL HIGH (ref 70–99)
Potassium: 4.1 mmol/L (ref 3.5–5.1)
Sodium: 136 mmol/L (ref 135–145)

## 2022-12-31 LAB — TROPONIN I (HIGH SENSITIVITY)
Troponin I (High Sensitivity): 3 ng/L (ref <18)
Troponin I (High Sensitivity): 2 ng/L (ref <18)

## 2022-12-31 MED ORDER — PREDNISONE 50 MG PO TABS: 60.0000 mg | ORAL_TABLET | Freq: Once | ORAL | Status: DC

## 2022-12-31 MED ORDER — AZITHROMYCIN 250 MG PO TABS
500.0000 mg | ORAL_TABLET | Freq: Once | ORAL | Status: AC
  Administered 2022-12-31: 500 mg via ORAL
  Filled 2022-12-31: qty 2

## 2022-12-31 MED ORDER — AZITHROMYCIN 250 MG PO TABS: 250.0000 mg | ORAL_TABLET | Freq: Every day | ORAL | 0 refills | Status: DC

## 2022-12-31 NOTE — Discharge Instructions (Addendum)
You are seen in the emergency department for shortness of breath in the setting of COVID infection.  Your lab work looked okay your chest x-ray showed some possible pneumonia.  Please continue your Paxlovid and your steroids.  We are adding an antibiotic.  Please rest and stop smoking.  Return to the emergency department if any worsening or concerning symptoms.

## 2022-12-31 NOTE — ED Notes (Signed)
Called patient to be room. No answer.

## 2022-12-31 NOTE — ED Provider Notes (Signed)
[facility] Provider Note   CSN: [id number] Arrival date & time: 12/31/22  1805     History {Add pertinent medical, surgical, social history, OB history to HPI:1} Chief Complaint  Patient presents with  . Shortness of Breath    Tyrone [name]. is a 67 y.o. male.  He has a history of peripheral vascular disease, COPD, prostate cancer.  He said he started getting sick on Friday with cough and shortness of breath.  He tested positive for COVID Sunday and started Paxlovid yesterday.  He continues to be very short of breath especially with any type of exertion.  He went to [facility] today and his saturations were around 93%.  No nausea vomiting diarrhea.  He does not think he has been having a fever.  The history is provided by the patient.  Shortness of Breath Severity:  Moderate Onset quality:  Gradual Duration:  5 days Timing:  Intermittent Progression:  Unchanged Chronicity:  New Relieved by:  Nothing Worsened by:  Activity Ineffective treatments:  Rest Associated symptoms: cough   Associated symptoms: no abdominal pain, no chest pain, no fever, no hemoptysis, no sputum production and no vomiting   Risk factors: tobacco use        Home Medications Prior to Admission medications   Medication Sig Start Date End Date Taking? Authorizing Provider  aspirin 325 MG tablet Take 325 mg by mouth every evening.    [provider]  atorvastatin (LIPITOR) 40 MG tablet Take 1 tablet (40 mg total) by mouth daily. Patient taking differently: Take 40 mg by mouth at bedtime. 10/28/21 10/28/22  Cherre Robins, MD  ezetimibe (ZETIA) 10 MG tablet Take 1 tablet (10 mg total) by mouth daily. 03/08/22   Rhyne, Hulen Shouts, PA-C  morphine (MS CONTIN) 30 MG 12 hr tablet Take 30 mg by mouth every 12 (twelve) hours. 06/21/21   [provider]  morphine (MSIR) 15 MG tablet Take 15 mg by mouth every 6 (six) hours as needed for moderate pain  (PAIN.). 03/28/21   [provider]  Multiple Vitamin (MULTIVITAMIN WITH MINERALS) TABS tablet Take 1 tablet by mouth every evening. Centrum Silver    [provider]  nicotine (NICODERM CQ - DOSED IN MG/24 HOURS) 14 mg/24hr patch Place 14 mg onto the skin daily.    [provider]  oxymetazoline (AFRIN) 0.05 % nasal spray Place 1 spray into both nostrils 2 (two) times daily as needed for congestion.    [provider]  varenicline (CHANTIX) 1 MG tablet Take 1 tablet (1 mg total) by mouth 2 (two) times daily. Take 0.5 mg daily for 3 days.  Then 0.5 mg twice daily for 4 days.  Then 1 mg twice daily for continuing therapy. 07/09/22   Marty Heck, MD      Allergies    Neurontin [gabapentin], Celexa [citalopram hydrobromide], and Topamax [topiramate]    Review of Systems   Review of Systems  Constitutional:  Negative for fever.  Respiratory:  Positive for cough and shortness of breath. Negative for hemoptysis and sputum production.   Cardiovascular:  Negative for chest pain.  Gastrointestinal:  Negative for abdominal pain and vomiting.    Physical Exam Updated Vital Signs BP (!) 145/95   Pulse 76   Temp 98 F (36.7 C)   Resp 18   Ht 5\' 10"  (1.778 m)   Wt 90.7 kg   SpO2 97%   BMI 28.69 kg/m  Physical Exam Vitals and nursing note reviewed.  Constitutional:      General: He is not in acute distress.    Appearance: He is well-developed.  HENT:     Head: Normocephalic and atraumatic.  Eyes:     Conjunctiva/sclera: Conjunctivae normal.  Cardiovascular:     Rate and Rhythm: Normal rate and regular rhythm.     Heart sounds: No murmur heard. Pulmonary:     Effort: Pulmonary effort is normal. No respiratory distress.     Breath sounds: Normal breath sounds.  Abdominal:     Palpations: Abdomen is soft.     Tenderness: There is no abdominal tenderness.  Musculoskeletal:        General: No swelling.     Cervical back: Neck supple.     Right  lower leg: No edema.     Left lower leg: No edema.  Skin:    General: Skin is warm and dry.     Capillary Refill: Capillary refill takes less than 2 seconds.  Neurological:     General: No focal deficit present.     Mental Status: He is alert.     ED Results / Procedures / Treatments   Labs (all labs ordered are listed, but only abnormal results are displayed) Labs Reviewed  BASIC METABOLIC PANEL - Abnormal; Notable for the following components:      Result Value   Glucose, Bld 127 (*)    All other components within normal limits  CBC  TROPONIN I (HIGH SENSITIVITY)  TROPONIN I (HIGH SENSITIVITY)    EKG EKG Interpretation  Date/Time:  Tuesday December 31 2022 18:29:56 EDT Ventricular Rate:  86 PR Interval:  148 QRS Duration: 82 QT Interval:  340 QTC Calculation: 406 R Axis:   79 Text Interpretation: Normal sinus rhythm Right atrial enlargement Borderline ECG When compared with ECG of 18-Oct-2021 10:46, No significant change was found Confirmed by Aletta Edouard [phone]) on 12/31/2022 6:31:57 PM  Radiology DG Chest 2 View  Result Date: 12/31/2022 CLINICAL DATA:  Shortness of breath. COVID positive yesterday. History of COPD. EXAM: CHEST - 2 VIEW COMPARISON:  Chest and rib radiographs 11/30/2015. Lung bases from abdominal CT 12/05/2020 FINDINGS: Chronic hyperinflation with emphysema and interstitial coarsening. Chronic scarring in the left lung base. Blunting of the costophrenic angles is favored to represent hyperinflation rather than pleural effusion. Ill-defined patchy opacity in both lung bases. No pneumothorax. The heart is normal in size. Stable mediastinal contours. IMPRESSION: 1. Ill-defined patchy opacity in both lung bases, may represent atelectasis or infection in the setting of COVID 19. 2. Chronic hyperinflation, emphysema and interstitial coarsening. Chronic peripheral scarring at the left lung base. Electronically Signed   By: Keith Rake M.D.   On: 12/31/2022  18:52    Procedures Procedures  {Document cardiac monitor, telemetry assessment procedure when appropriate:1}  Medications Ordered in ED Medications - No data to display  ED Course/ Medical Decision Making/ A&P Clinical Course as of 12/31/22 2153  Tue Dec 31, 2022  2152 Chest x-ray interpreted by me as possible lower lobe infiltrate.  Awaiting radiology reading. [MB]    Clinical Course User Index [MB] Hayden Rasmussen, MD   {   Click here for ABCD2, HEART and other calculatorsREFRESH Note before signing :1}                          Medical Decision Making Amount and/or Complexity of Data Reviewed Labs: ordered. Radiology: ordered.   ***  {  Document critical care time when appropriate:1} {Document review of labs and clinical decision tools ie heart score, Chads2Vasc2 etc:1}  {Document your independent review of radiology images, and any outside records:1} {Document your discussion with family members, caretakers, and with consultants:1} {Document social determinants of health affecting pt's care:1} {Document your decision making why or why not admission, treatments were needed:1} Final Clinical Impression(s) / ED Diagnoses Final diagnoses:  None    Rx / DC Orders ED Discharge Orders     None

## 2022-12-31 NOTE — ED Notes (Signed)
All appropriate discharge materials reviewed at length with patient. Time for questions provided. Pt has no other questions at this time and verbalizes understanding of all provided materials.  

## 2022-12-31 NOTE — ED Triage Notes (Signed)
Patient here POV from Home.  Endorses feeling ill since Friday. Began initially with a Cough and Aches.   Tested Positive with COVID-19 yesterday. Began taking Paxlovid yesterday. Seeks Evaluation today with Continued SOB and worsening SOB with Exertion.   NAD Noted during Triage. A&Ox4. GCS 15. Ambulatory.

## 2023-01-06 ENCOUNTER — Emergency Department (HOSPITAL_COMMUNITY): Payer: 59

## 2023-01-06 ENCOUNTER — Other Ambulatory Visit: Payer: Self-pay

## 2023-01-06 ENCOUNTER — Observation Stay (HOSPITAL_COMMUNITY)
Admission: EM | Admit: 2023-01-06 | Discharge: 2023-01-07 | Disposition: A | Discharge status: Home / Self Care | Payer: 59 | Attending: Internal Medicine | Admitting: Internal Medicine

## 2023-01-06 ENCOUNTER — Encounter (HOSPITAL_COMMUNITY): Payer: Self-pay

## 2023-01-06 DIAGNOSIS — J438 Other emphysema: Secondary | ICD-10-CM

## 2023-01-06 DIAGNOSIS — F17200 Nicotine dependence, unspecified, uncomplicated: Secondary | ICD-10-CM | POA: Diagnosis present

## 2023-01-06 DIAGNOSIS — Z79899 Other long term (current) drug therapy: Secondary | ICD-10-CM | POA: Insufficient documentation

## 2023-01-06 DIAGNOSIS — J440 Chronic obstructive pulmonary disease with acute lower respiratory infection: Secondary | ICD-10-CM

## 2023-01-06 DIAGNOSIS — I10 Essential (primary) hypertension: Secondary | ICD-10-CM | POA: Diagnosis not present

## 2023-01-06 DIAGNOSIS — Z7982 Long term (current) use of aspirin: Secondary | ICD-10-CM | POA: Diagnosis not present

## 2023-01-06 DIAGNOSIS — Z87891 Personal history of nicotine dependence: Secondary | ICD-10-CM | POA: Diagnosis not present

## 2023-01-06 DIAGNOSIS — J449 Chronic obstructive pulmonary disease, unspecified: Secondary | ICD-10-CM | POA: Diagnosis present

## 2023-01-06 DIAGNOSIS — R059 Cough, unspecified: Secondary | ICD-10-CM | POA: Diagnosis present

## 2023-01-06 DIAGNOSIS — J441 Chronic obstructive pulmonary disease with (acute) exacerbation: Principal | ICD-10-CM | POA: Insufficient documentation

## 2023-01-06 DIAGNOSIS — J189 Pneumonia, unspecified organism: Secondary | ICD-10-CM | POA: Insufficient documentation

## 2023-01-06 LAB — COMPREHENSIVE METABOLIC PANEL
ALT: 33 U/L (ref 0–44)
AST: 29 U/L (ref 15–41)
Albumin: 3.6 g/dL (ref 3.5–5.0)
Alkaline Phosphatase: 80 U/L (ref 38–126)
Anion gap: 12 (ref 5–15)
BUN: 14 mg/dL (ref 8–23)
CO2: 25 mmol/L (ref 22–32)
Calcium: 8.9 mg/dL (ref 8.9–10.3)
Chloride: 97 mmol/L — ABNORMAL LOW (ref 98–111)
Creatinine, Ser: 0.86 mg/dL (ref 0.61–1.24)
GFR, Estimated: >60 mL/min (ref >60)
Glucose, Bld: 108 mg/dL — ABNORMAL HIGH (ref 70–99)
Potassium: 4.2 mmol/L (ref 3.5–5.1)
Sodium: 134 mmol/L — ABNORMAL LOW (ref 135–145)
Total Bilirubin: 0.7 mg/dL (ref 0.3–1.2)
Total Protein: 6.5 g/dL (ref 6.5–8.1)

## 2023-01-06 LAB — CBC WITH DIFFERENTIAL/PLATELET
Abs Immature Granulocytes: 0.05 10*3/uL (ref 0.00–0.07)
Basophils Absolute: 0.1 10*3/uL (ref 0.0–0.1)
Basophils Relative: 1 %
Eosinophils Absolute: 0.3 10*3/uL (ref 0.0–0.5)
Eosinophils Relative: 3 %
HCT: 47.1 % (ref 39.0–52.0)
Hemoglobin: 16.5 g/dL (ref 13.0–17.0)
Immature Granulocytes: 1 %
Lymphocytes Relative: 31 %
Lymphs Abs: 3.1 10*3/uL (ref 0.7–4.0)
MCH: 30.4 pg (ref 26.0–34.0)
MCHC: 35 g/dL (ref 30.0–36.0)
MCV: 86.9 fL (ref 80.0–100.0)
Monocytes Absolute: 0.7 10*3/uL (ref 0.1–1.0)
Monocytes Relative: 7 %
Neutro Abs: 5.7 10*3/uL (ref 1.7–7.7)
Neutrophils Relative %: 57 %
Platelets: 287 10*3/uL (ref 150–400)
RBC: 5.42 MIL/uL (ref 4.22–5.81)
RDW: 11.8 % (ref 11.5–15.5)
WBC: 9.9 10*3/uL (ref 4.0–10.5)
nRBC: 0 % (ref 0.0–0.2)

## 2023-01-06 LAB — TROPONIN I (HIGH SENSITIVITY)
Troponin I (High Sensitivity): 4 ng/L (ref <18)
Troponin I (High Sensitivity): 23 ng/L — ABNORMAL HIGH (ref <18)

## 2023-01-06 LAB — LACTIC ACID, PLASMA
Lactic Acid, Venous: 2.2 mmol/L (ref 0.5–1.9)
Lactic Acid, Venous: 1.6 mmol/L (ref 0.5–1.9)

## 2023-01-06 MED ORDER — ASPIRIN 325 MG PO TABS
325.0000 mg | ORAL_TABLET | Freq: Every evening | ORAL | Status: DC
  Administered 2023-01-06: 325 mg via ORAL
  Filled 2023-01-06: qty 1

## 2023-01-06 MED ORDER — ACETAMINOPHEN 650 MG RE SUPP: 650.0000 mg | Freq: Four times a day (QID) | RECTAL | Status: DC | PRN

## 2023-01-06 MED ORDER — MOMETASONE FURO-FORMOTEROL FUM 200-5 MCG/ACT IN AERO
2.0000 | INHALATION_SPRAY | Freq: Two times a day (BID) | RESPIRATORY_TRACT | Status: DC
  Administered 2023-01-06 – 2023-01-07 (×2): 2 via RESPIRATORY_TRACT
  Filled 2023-01-06: qty 8.8

## 2023-01-06 MED ORDER — IPRATROPIUM-ALBUTEROL 0.5-2.5 (3) MG/3ML IN SOLN
9.0000 mL | Freq: Once | RESPIRATORY_TRACT | Status: AC
  Administered 2023-01-06: 9 mL via RESPIRATORY_TRACT
  Filled 2023-01-06: qty 3

## 2023-01-06 MED ORDER — MORPHINE SULFATE ER 15 MG PO TBCR: 15.0000 mg | EXTENDED_RELEASE_TABLET | Freq: Three times a day (TID) | ORAL | Status: DC | PRN

## 2023-01-06 MED ORDER — IPRATROPIUM-ALBUTEROL 0.5-2.5 (3) MG/3ML IN SOLN
3.0000 mL | Freq: Four times a day (QID) | RESPIRATORY_TRACT | Status: DC
  Administered 2023-01-06 – 2023-01-07 (×3): 3 mL via RESPIRATORY_TRACT
  Filled 2023-01-06 (×3): qty 3

## 2023-01-06 MED ORDER — GUAIFENESIN ER 600 MG PO TB12
1200.0000 mg | ORAL_TABLET | Freq: Two times a day (BID) | ORAL | Status: DC
  Administered 2023-01-06 – 2023-01-07 (×3): 1200 mg via ORAL
  Filled 2023-01-06 (×3): qty 2

## 2023-01-06 MED ORDER — MAGNESIUM SULFATE 2 GM/50ML IV SOLN
2.0000 g | Freq: Once | INTRAVENOUS | Status: AC
  Administered 2023-01-06: 2 g via INTRAVENOUS
  Filled 2023-01-06: qty 50

## 2023-01-06 MED ORDER — DOXYCYCLINE HYCLATE 100 MG PO TABS
100.0000 mg | ORAL_TABLET | Freq: Two times a day (BID) | ORAL | Status: DC
  Administered 2023-01-06 – 2023-01-07 (×2): 100 mg via ORAL
  Filled 2023-01-06 (×2): qty 1

## 2023-01-06 MED ORDER — VARENICLINE TARTRATE 1 MG PO TABS
1.0000 mg | ORAL_TABLET | Freq: Two times a day (BID) | ORAL | Status: DC
  Administered 2023-01-06 – 2023-01-07 (×2): 1 mg via ORAL
  Filled 2023-01-06 (×3): qty 1

## 2023-01-06 MED ORDER — ACETAMINOPHEN 325 MG PO TABS: 650.0000 mg | ORAL_TABLET | Freq: Four times a day (QID) | ORAL | Status: DC | PRN

## 2023-01-06 MED ORDER — ENOXAPARIN SODIUM 40 MG/0.4ML IJ SOSY
40.0000 mg | PREFILLED_SYRINGE | INTRAMUSCULAR | Status: DC
  Administered 2023-01-06: 40 mg via SUBCUTANEOUS
  Filled 2023-01-06: qty 0.4

## 2023-01-06 MED ORDER — MORPHINE SULFATE ER 30 MG PO TBCR: 30.0000 mg | EXTENDED_RELEASE_TABLET | Freq: Two times a day (BID) | ORAL | Status: DC

## 2023-01-06 MED ORDER — METHYLPREDNISOLONE SODIUM SUCC 125 MG IJ SOLR
125.0000 mg | Freq: Once | INTRAMUSCULAR | Status: AC
  Administered 2023-01-06: 125 mg via INTRAVENOUS
  Filled 2023-01-06: qty 2

## 2023-01-06 MED ORDER — BUDESONIDE 0.5 MG/2ML IN SUSP
2.0000 mg | Freq: Two times a day (BID) | RESPIRATORY_TRACT | Status: DC
  Administered 2023-01-06 – 2023-01-07 (×2): 2 mg via RESPIRATORY_TRACT
  Filled 2023-01-06 (×2): qty 8

## 2023-01-06 MED ORDER — NICOTINE 14 MG/24HR TD PT24
14.0000 mg | MEDICATED_PATCH | Freq: Every day | TRANSDERMAL | Status: DC
  Administered 2023-01-06 – 2023-01-07 (×2): 14 mg via TRANSDERMAL
  Filled 2023-01-06 (×2): qty 1

## 2023-01-06 MED ORDER — EZETIMIBE 10 MG PO TABS
10.0000 mg | ORAL_TABLET | Freq: Every day | ORAL | Status: DC
  Administered 2023-01-06 – 2023-01-07 (×2): 10 mg via ORAL
  Filled 2023-01-06 (×2): qty 1

## 2023-01-06 MED ORDER — IPRATROPIUM-ALBUTEROL 0.5-2.5 (3) MG/3ML IN SOLN
RESPIRATORY_TRACT | Status: AC
  Filled 2023-01-06: qty 6

## 2023-01-06 MED ORDER — ALBUTEROL SULFATE (2.5 MG/3ML) 0.083% IN NEBU: 2.5000 mg | INHALATION_SOLUTION | RESPIRATORY_TRACT | Status: DC | PRN

## 2023-01-06 MED ORDER — PREDNISONE 20 MG PO TABS: 40.0000 mg | ORAL_TABLET | Freq: Every day | ORAL | Status: DC

## 2023-01-06 MED ORDER — METHYLPREDNISOLONE SODIUM SUCC 40 MG IJ SOLR
40.0000 mg | Freq: Two times a day (BID) | INTRAMUSCULAR | Status: AC
  Administered 2023-01-06 – 2023-01-07 (×2): 40 mg via INTRAVENOUS
  Filled 2023-01-06 (×2): qty 1

## 2023-01-06 MED ORDER — ATORVASTATIN CALCIUM 40 MG PO TABS
40.0000 mg | ORAL_TABLET | Freq: Every day | ORAL | Status: DC
  Administered 2023-01-06: 40 mg via ORAL
  Filled 2023-01-06: qty 1

## 2023-01-06 NOTE — ED Triage Notes (Signed)
Patient here for evaluation of shortness of breath after being diagnosed with COVID approximately one week ago. Was started on paxlovid, steroids, and antibiotics. Room air SpO2 96%, no tachypnea. Patient is alert, oriented, reporting difficulty speaking in complete sentences.

## 2023-01-06 NOTE — Progress Notes (Signed)
Received patient from ED. Patient alert and oriented x4, VSS, room air. Orders reviewed, plan of care discussed.  Skin assessment and unit orientation completed. Patient is independent/ambulatory. IS teaching and return demonstration. NAD, handoff to night shift nurse.

## 2023-01-06 NOTE — ED Notes (Signed)
ED TO INPATIENT HANDOFF REPORT  ED Nurse Name and Phone #: [phone]  S Name/Age/Gender Tyrone [name]. 67 y.o. male Room/Bed: 026C/026C  Code Status   Code Status: Full Code  Home/SNF/Other Home Patient oriented to: self, place, time, and situation Is this baseline? Yes   Triage Complete: Triage complete  Chief Complaint COPD (chronic obstructive pulmonary disease) ([place]) [J44.9]  Triage Note Patient here for evaluation of shortness of breath after being diagnosed with COVID approximately one week ago. Was started on paxlovid, steroids, and antibiotics. Room air SpO2 96%, no tachypnea. Patient is alert, oriented, reporting difficulty speaking in complete sentences.    Allergies Allergies  Allergen Reactions   Neurontin [Gabapentin] Itching and Other (See Comments)    Confusion and sedation.   Celexa [Citalopram Hydrobromide]     restless leg/insomnia worse   Topamax [Topiramate]     numbness in hand    Level of Care/Admitting Diagnosis ED Disposition     ED Disposition  Admit   Condition  --   [place]: [place] [[id number]]  Level of Care: Med-Surg [16]  May place patient in observation at [facility] or [facility] if equivalent level of care is available:: No  Covid Evaluation: Recent COVID positive no isolation required infection day 21-90  Diagnosis: COPD (chronic obstructive pulmonary disease) [facility]) [id number]  Admitting Physician: Lequita Halt [id number]  Attending Physician: Lequita Halt [id number]          B Medical/Surgery History Past Medical History:  Diagnosis Date   Arthritis    hands, knees, back   COPD (chronic obstructive pulmonary disease) ([place])    Depression    Diskitis [id number]   Hardware complicating wound infection ([place]) 02/28/2021   Headache    migraines   Hypertension    Legionella pneumonia ([place]) 02/28/2021   Neuromuscular disorder ([place])    hands   Peripheral vascular disease ([place])     Pre-diabetes    Prostate cancer ([place])    Smoker 11/05/2021   Vaccine counseling 11/05/2021   Past Surgical History:  Procedure Laterality Date   ABDOMINAL AORTOGRAM W/LOWER EXTREMITY Bilateral 10/18/2021   Procedure: ABDOMINAL AORTOGRAM W/LOWER EXTREMITY;  Surgeon: Marty Heck, MD;  Location: [place] [facility];  Service: Cardiovascular;  Laterality: Bilateral;   ABDOMINAL AORTOGRAM W/LOWER EXTREMITY Right 02/28/2022   Procedure: ABDOMINAL AORTOGRAM W/LOWER EXTREMITY;  Surgeon: Marty Heck, MD;  Location: [place] [facility];  Service: Cardiovascular;  Laterality: Right;   APPENDECTOMY     BACK SURGERY  1980"s   L4 to L 5 laminectomy   COLONOSCOPY WITH PROPOFOL N/A 02/06/2016   Procedure: COLONOSCOPY WITH PROPOFOL;  Surgeon: Garlan Fair, MD;  [facility];  Service: Endoscopy;  Laterality: N/A;   DIAGNOSTIC LAPAROSCOPY     ENDARTERECTOMY FEMORAL Left 10/24/2021   Procedure: ENDARTERECTOMY FEMORAL WITH PROFUNDAPLASTY;  Surgeon: Marty Heck, MD;  Location: [place];  Service: Vascular;  Laterality: Left;   ENDARTERECTOMY FEMORAL Right 03/06/2022   Procedure: RIGHT COMMON FEMORAL ENDARTERECTOMY;  Surgeon: Marty Heck, MD;  Location: [place];  Service: Vascular;  Laterality: Right;   FEMORAL-POPLITEAL BYPASS GRAFT Left 10/24/2021   Procedure: LEFT COMMON FEMORAL- BELOW KNEE POPLITEAL BYPASS;  Surgeon: Marty Heck, MD;  Location: [place];  Service: Vascular;  Laterality: Left;   FEMORAL-POPLITEAL BYPASS GRAFT Right 03/06/2022   Procedure: RIGHT FEMORAL-POPLITEAL BYPASS WITH GORETEC GRAFT;  Surgeon: Marty Heck, MD;  Location: [place];  Service:  Vascular;  Laterality: Right;  INSERT ARTERIAL LINE   LAPAROSCOPIC APPENDECTOMY N/A 08/13/2018   Procedure: APPENDECTOMY LAPAROSCOPIC;  Surgeon: Alphonsa Overall, MD;  [facility];  Service: General;  Laterality: N/A;   PATCH ANGIOPLASTY Left 10/24/2021   Procedure: PATCH ANGIOPLASTY;  Surgeon: Marty Heck, MD;  Location: [place];  Service: Vascular;  Laterality: Left;   PELVIC LYMPH NODE DISSECTION Bilateral 02/08/2019   Procedure: PELVIC LYMPH NODE DISSECTION;  Surgeon: Lucas Mallow, MD;  [facility];  Service: Urology;  Laterality: Bilateral;   ROBOT ASSISTED LAPAROSCOPIC RADICAL PROSTATECTOMY N/A 02/08/2019   Procedure: XI ROBOTIC ASSISTED LAPAROSCOPIC RADICAL PROSTATECTOMY;  Surgeon: Lucas Mallow, MD;  [facility];  Service: Urology;  Laterality: N/A;   TRANSFORAMINAL LUMBAR INTERBODY FUSION (TLIF) WITH PEDICLE SCREW FIXATION 1 LEVEL Left 09/12/2020   Procedure: LEFT-SIDED LUMBAR 1 - LUMBAR 2 TRANSFORAMINAL LUMBAR INTERBODY FUSION WITH INSTRUMENTATION AND ALLOGRAFT;  Surgeon: Phylliss Bob, MD;  Location: [place];  Service: Orthopedics;  Laterality: Left;  3C BED   VEIN HARVEST Left 10/24/2021   Procedure: VEIN HARVEST;  Surgeon: Marty Heck, MD;  Location: [place];  Service: Vascular;  Laterality: Left;   WRIST SURGERY Right    x 2      A IV Location/Drains/Wounds Patient Lines/Drains/Airways Status     Active Line/Drains/Airways     Name Placement date Placement time Site Days   Peripheral IV 01/06/23 20 G Anterior;Right;Upper Arm 01/06/23  1304  Arm  less than 1            Intake/Output Last 24 hours No intake or output data in the 24 hours ending 01/06/23 1527  Labs/Imaging Results for orders placed or performed during the hospital encounter of 01/06/23 (from the past 48 hour(s))  Lactic acid, plasma     Status: Abnormal   Collection Time: 01/06/23  1:00 PM  Result Value Ref Range   Lactic Acid, Venous 2.2 (HH) 0.5 - 1.9 mmol/L    Comment: CRITICAL RESULT CALLED TO, READ BACK BY AND VERIFIED WITH E.Lillyauna Jenkinson,RN @1420  01/06/2023 [email or web address].J Performed at [facility], [place] [address] [place], [place] [address]   Comprehensive metabolic panel     Status: Abnormal   Collection Time: 01/06/23  1:00 PM  Result Value Ref Range   Sodium 134  (L) 135 - 145 mmol/L   Potassium 4.2 3.5 - 5.1 mmol/L   Chloride 97 (L) 98 - 111 mmol/L   CO2 25 22 - 32 mmol/L   Glucose, Bld 108 (H) 70 - 99 mg/dL    Comment: Glucose reference range applies only to samples taken after fasting for at least 8 hours.   BUN 14 8 - 23 mg/dL   Creatinine, Ser 0.86 0.61 - 1.24 mg/dL   Calcium 8.9 8.9 - 10.3 mg/dL   Total Protein 6.5 6.5 - 8.1 g/dL   Albumin 3.6 3.5 - 5.0 g/dL   AST 29 15 - 41 U/L   ALT 33 0 - 44 U/L   Alkaline Phosphatase 80 38 - 126 U/L   Total Bilirubin 0.7 0.3 - 1.2 mg/dL   GFR, Estimated >60 >60 mL/min    Comment: (NOTE) Calculated using the CKD-EPI Creatinine Equation (2021)    Anion gap 12 5 - 15    Comment: Performed at [facility] [address] [place], [place] [address]  CBC with Differential     Status: None   Collection Time: 01/06/23  1:00 PM  Result Value Ref Range   WBC 9.9 4.0 - 10.5 K/uL   RBC 5.42 4.22 - 5.81 MIL/uL   Hemoglobin 16.5 13.0 - 17.0 g/dL   HCT 47.1 39.0 - 52.0 %   MCV 86.9 80.0 - 100.0 fL   MCH 30.4 26.0 - 34.0 pg   MCHC 35.0 30.0 - 36.0 g/dL   RDW 11.8 11.5 - 15.5 %   Platelets 287 150 - 400 K/uL   nRBC 0.0 0.0 - 0.2 %   Neutrophils Relative % 57 %   Neutro Abs 5.7 1.7 - 7.7 K/uL   Lymphocytes Relative 31 %   Lymphs Abs 3.1 0.7 - 4.0 K/uL   Monocytes Relative 7 %   Monocytes Absolute 0.7 0.1 - 1.0 K/uL   Eosinophils Relative 3 %   Eosinophils Absolute 0.3 0.0 - 0.5 K/uL   Basophils Relative 1 %   Basophils Absolute 0.1 0.0 - 0.1 K/uL   Immature Granulocytes 1 %   Abs Immature Granulocytes 0.05 0.00 - 0.07 K/uL    Comment: Performed at [facility], [address] [place], [place] [address]  Troponin I (High Sensitivity)     Status: None   Collection Time: 01/06/23  1:00 PM  Result Value Ref Range   Troponin I (High Sensitivity) 4 <18 ng/L    Comment: (NOTE) Elevated high sensitivity troponin I (hsTnI) values and significant  changes across serial measurements may suggest  ACS but many other  chronic and acute conditions are known to elevate hsTnI results.  Refer to the "Links" section for chest pain algorithms and additional  guidance. Performed at [facility], [place] [address], [place], [place] [address]    DG Chest 2 View  Result Date: 01/06/2023 CLINICAL DATA:  Shortness of breath EXAM: CHEST - 2 VIEW COMPARISON:  12/31/2022 FINDINGS: Cardiac size is within normal limits. Flattening of diaphragms suggests COPD. In the lateral view, increased markings are seen in posterior lower lung field. There is blunting of left lateral CP angle. There is slight improvement in aeration of lower lung fields. There is no pneumothorax. IMPRESSION: Increased density seen in posterior left lower lung field may suggest atelectasis/pneumonia. There is interval improvement in aeration in both lower lung fields. No new infiltrates are seen. Blunting of left lateral CP angle may be due to pleural thickening or minimal effusion. Electronically Signed   By: Elmer Picker M.D.   On: 01/06/2023 12:59    Pending Labs Unresulted Labs (From admission, onward)     Start     Ordered   01/06/23 1521  Expectorated Sputum Assessment w Gram Stain, Rflx to Resp Cult  Once,   R        01/06/23 1520   01/06/23 1520  HIV Antibody (routine testing w rflx)  (HIV Antibody (Routine testing w reflex) panel)  Once,   R        01/06/23 1520   01/06/23 1148  Lactic acid, plasma  Now then every 2 hours,   R (with STAT occurrences)      01/06/23 1148            Vitals/Pain Today's Vitals   01/06/23 1147 01/06/23 1149 01/06/23 1300 01/06/23 1430  BP:  (!) 145/86 (!) 151/85 109/79  Pulse:  100 89 95  Resp:  (!) 24 20 12   Temp:  97.7 F (36.5 C)    TempSrc:  Oral    SpO2:  96% 96% 100%  PainSc: 0-No pain  Isolation Precautions No active isolations  Medications Medications  magnesium sulfate IVPB 2 g 50 mL (2 g Intravenous New Bag/Given 01/06/23 1427)  aspirin tablet 325  mg (has no administration in time range)  morphine (MS CONTIN) 12 hr tablet 30 mg (has no administration in time range)  doxycycline (VIBRA-TABS) tablet 100 mg (has no administration in time range)  atorvastatin (LIPITOR) tablet 40 mg (has no administration in time range)  ezetimibe (ZETIA) tablet 10 mg (has no administration in time range)  nicotine (NICODERM CQ - dosed in mg/24 hours) patch 14 mg (has no administration in time range)  varenicline (CHANTIX) tablet 1 mg (has no administration in time range)  budesonide (PULMICORT) nebulizer solution 2 mg (has no administration in time range)  methylPREDNISolone sodium succinate (SOLU-MEDROL) 40 mg/mL injection 40 mg (has no administration in time range)    Followed by  predniSONE (DELTASONE) tablet 40 mg (has no administration in time range)  mometasone-formoterol (DULERA) 200-5 MCG/ACT inhaler 2 puff (has no administration in time range)  ipratropium-albuterol (DUONEB) 0.5-2.5 (3) MG/3ML nebulizer solution 3 mL (has no administration in time range)  albuterol (PROVENTIL) (2.5 MG/3ML) 0.083% nebulizer solution 2.5 mg (has no administration in time range)  guaiFENesin (MUCINEX) 12 hr tablet 1,200 mg (has no administration in time range)  enoxaparin (LOVENOX) injection 40 mg (has no administration in time range)  acetaminophen (TYLENOL) tablet 650 mg (has no administration in time range)    Or  acetaminophen (TYLENOL) suppository 650 mg (has no administration in time range)  ipratropium-albuterol (DUONEB) 0.5-2.5 (3) MG/3ML nebulizer solution 9 mL ( Nebulization Not Given 01/06/23 1435)  methylPREDNISolone sodium succinate (SOLU-MEDROL) 125 mg/2 mL injection 125 mg (125 mg Intravenous Given 01/06/23 1425)    Mobility walks     Focused Assessments Pulmonary Assessment Handoff:  Lung sounds: Bilateral Breath Sounds: Diminished O2 Device: Room Air      R Recommendations: See Admitting Provider Note  Report given to:   Additional  Notes:

## 2023-01-06 NOTE — ED Provider Notes (Signed)
[facility] Provider Note   CSN: [id number] Arrival date & time: 01/06/23  1139     History  Chief Complaint  Patient presents with   Shortness of Breath    Tyrone Whan. is a 66 y.o. male.  67 year old male presents today for evaluation of persistent shortness of breath.  He has history of COPD.  He was diagnosed with COVID last Monday and was started on Paxlovid and steroids.  He was then presented to the emergency department the day after on Tuesday and had a chest x-ray done which showed pneumonia and he was started on Zithromax.  He has completed all these medications without any improvement in symptoms.  Reports minimal exertion still gets him very short of breath.  Endorses ongoing cough.  Endorses wheezing.  Denies any chest pain, or fever.  The history is provided by the patient. No language interpreter was used.       Home Medications Prior to Admission medications   Medication Sig Start Date End Date Taking? Authorizing Provider  aspirin 325 MG tablet Take 325 mg by mouth every evening.    [provider]  atorvastatin (LIPITOR) 40 MG tablet Take 1 tablet (40 mg total) by mouth daily. Patient taking differently: Take 40 mg by mouth at bedtime. 10/28/21 10/28/22  Cherre Robins, MD  azithromycin (ZITHROMAX) 250 MG tablet Take 1 tablet (250 mg total) by mouth daily. 12/31/22   Hayden Rasmussen, MD  ezetimibe (ZETIA) 10 MG tablet Take 1 tablet (10 mg total) by mouth daily. 03/08/22   Rhyne, Hulen Shouts, PA-C  morphine (MS CONTIN) 30 MG 12 hr tablet Take 30 mg by mouth every 12 (twelve) hours. 06/21/21   [provider]  morphine (MSIR) 15 MG tablet Take 15 mg by mouth every 6 (six) hours as needed for moderate pain (PAIN.). 03/28/21   [provider]  Multiple Vitamin (MULTIVITAMIN WITH MINERALS) TABS tablet Take 1 tablet by mouth every evening. Centrum Silver    [provider]  nicotine  (NICODERM CQ - DOSED IN MG/24 HOURS) 14 mg/24hr patch Place 14 mg onto the skin daily.    [provider]  oxymetazoline (AFRIN) 0.05 % nasal spray Place 1 spray into both nostrils 2 (two) times daily as needed for congestion.    [provider]  varenicline (CHANTIX) 1 MG tablet Take 1 tablet (1 mg total) by mouth 2 (two) times daily. Take 0.5 mg daily for 3 days.  Then 0.5 mg twice daily for 4 days.  Then 1 mg twice daily for continuing therapy. 07/09/22   Marty Heck, MD      Allergies    Neurontin [gabapentin], Celexa [citalopram hydrobromide], and Topamax [topiramate]    Review of Systems   Review of Systems  Constitutional:  Negative for chills and fever.  Respiratory:  Positive for cough and shortness of breath.   Cardiovascular:  Negative for chest pain and palpitations.  Neurological:  Negative for weakness and light-headedness.  All other systems reviewed and are negative.   Physical Exam Updated Vital Signs BP (!) 151/85   Pulse 89   Temp 97.7 F (36.5 C) (Oral)   Resp 20   SpO2 96%  Physical Exam Vitals and nursing note reviewed.  Constitutional:      General: He is not in acute distress.    Appearance: Normal appearance. He is not ill-appearing.  HENT:     Head: Normocephalic and atraumatic.  Nose: Nose normal.  Eyes:     General: No scleral icterus.    Extraocular Movements: Extraocular movements intact.     Conjunctiva/sclera: Conjunctivae normal.  Cardiovascular:     Rate and Rhythm: Normal rate and regular rhythm.     Pulses: Normal pulses.  Pulmonary:     Effort: Pulmonary effort is normal. No respiratory distress.     Breath sounds: Wheezing (diffuse) present. No rales.  Abdominal:     General: There is no distension.     Palpations: Abdomen is soft.     Tenderness: There is no abdominal tenderness. There is no guarding.  Musculoskeletal:        General: Normal range of motion.     Cervical back: Normal range of motion.      Right lower leg: No edema.     Left lower leg: No edema.  Skin:    General: Skin is warm and dry.  Neurological:     General: No focal deficit present.     Mental Status: He is alert. Mental status is at baseline.     ED Results / Procedures / Treatments   Labs (all labs ordered are listed, but only abnormal results are displayed) Labs Reviewed  LACTIC ACID, PLASMA  LACTIC ACID, PLASMA  COMPREHENSIVE METABOLIC PANEL  CBC WITH DIFFERENTIAL/PLATELET  TROPONIN I (HIGH SENSITIVITY)    EKG None  Radiology DG Chest 2 View  Result Date: 01/06/2023 CLINICAL DATA:  Shortness of breath EXAM: CHEST - 2 VIEW COMPARISON:  12/31/2022 FINDINGS: Cardiac size is within normal limits. Flattening of diaphragms suggests COPD. In the lateral view, increased markings are seen in posterior lower lung field. There is blunting of left lateral CP angle. There is slight improvement in aeration of lower lung fields. There is no pneumothorax. IMPRESSION: Increased density seen in posterior left lower lung field may suggest atelectasis/pneumonia. There is interval improvement in aeration in both lower lung fields. No new infiltrates are seen. Blunting of left lateral CP angle may be due to pleural thickening or minimal effusion. Electronically Signed   By: Elmer Picker M.D.   On: 01/06/2023 12:59    Procedures Procedures    Medications Ordered in ED Medications  ipratropium-albuterol (DUONEB) 0.5-2.5 (3) MG/3ML nebulizer solution 9 mL (has no administration in time range)  methylPREDNISolone sodium succinate (SOLU-MEDROL) 125 mg/2 mL injection 125 mg (has no administration in time range)  magnesium sulfate IVPB 2 g 50 mL (has no administration in time range)    ED Course/ Medical Decision Making/ A&P                             Medical Decision Making Amount and/or Complexity of Data Reviewed Labs: ordered. Radiology: ordered.  Risk Prescription drug management. Decision regarding  hospitalization.   Medical Decision Making / ED Course   This patient presents to the ED for concern of shortness of breath, this involves an extensive number of treatment options, and is a complaint that carries with it a high risk of complications and morbidity.  The differential diagnosis includes PE, pneumonia, viral URI, COPD exacerbation, CHF exacerbation  MDM: 67 year old male with past medical history significant for COPD presents today for with persistent shortness of breath and wheezing.  Ongoing for the past week.  Diagnosed with COVID.  Has done outpatient Paxlovid, Zithromax, as well as steroids without improvement.  Does have wheezing on initial exam.  Will obtain labs, provide DuoNeb,  magnesium, Solu-Medrol.   Chest x-ray shows persistent finding of infiltrate with some improvement.  No worsening since last week.  CBC without leukocytosis or anemia.  CMP shows preserved renal function, normal electrolytes.  Troponin negative.  Low suspicion for ACS.  Initial lactic of 2.2 improved to 1.6.  EKG without acute ischemic changes.  Patient following back-to-back DuoNebs, Solu-Medrol, magnesium without improvement.  Discussed with hospitalist will evaluate patient for admission.  Lab Tests: -I ordered, reviewed, and interpreted labs.   The pertinent results include:   Labs Reviewed  LACTIC ACID, PLASMA - Abnormal; Notable for the following components:      Result Value   Lactic Acid, Venous 2.2 (*)    All other components within normal limits  COMPREHENSIVE METABOLIC PANEL - Abnormal; Notable for the following components:   Sodium 134 (*)    Chloride 97 (*)    Glucose, Bld 108 (*)    All other components within normal limits  EXPECTORATED SPUTUM ASSESSMENT W GRAM STAIN, RFLX TO RESP C  CBC WITH DIFFERENTIAL/PLATELET  LACTIC ACID, PLASMA  HIV ANTIBODY (ROUTINE TESTING W REFLEX)  TROPONIN I (HIGH SENSITIVITY)  TROPONIN I (HIGH SENSITIVITY)      EKG  EKG  Interpretation  Date/Time:    Ventricular Rate:    PR Interval:    QRS Duration:   QT Interval:    QTC Calculation:   R Axis:     Text Interpretation:           Imaging Studies ordered: I ordered imaging studies including chest x-ray I independently visualized and interpreted imaging. I agree with the radiologist interpretation   Medicines ordered and prescription drug management: Meds ordered this encounter  Medications   ipratropium-albuterol (DUONEB) 0.5-2.5 (3) MG/3ML nebulizer solution 9 mL   methylPREDNISolone sodium succinate (SOLU-MEDROL) 125 mg/2 mL injection 125 mg    IV methylprednisolone will be converted to either a q12h or q24h frequency with the same total daily dose (TDD).  Ordered Dose: 1 to 125 mg TDD; convert to: TDD q24h.  Ordered Dose: 126 to 250 mg TDD; convert to: TDD div q12h.  Ordered Dose: >250 mg TDD; DAW.   magnesium sulfate IVPB 2 g 50 mL   ipratropium-albuterol (DUONEB) 0.5-2.5 (3) MG/3ML nebulizer solution    Anello, Elexes A: cabinet override   aspirin tablet 325 mg   morphine (MS CONTIN) 12 hr tablet 30 mg   doxycycline (VIBRA-TABS) tablet 100 mg   atorvastatin (LIPITOR) tablet 40 mg   ezetimibe (ZETIA) tablet 10 mg   nicotine (NICODERM CQ - dosed in mg/24 hours) patch 14 mg   varenicline (CHANTIX) tablet 1 mg    Take 0.5 mg daily for 3 days.  Then 0.5 mg twice daily for 4 days.  Then 1 mg twice daily for continuing therapy.     budesonide (PULMICORT) nebulizer solution 2 mg   FOLLOWED BY Linked Order Group    methylPREDNISolone sodium succinate (SOLU-MEDROL) 40 mg/mL injection 40 mg     IV methylprednisolone will be converted to either a q12h or q24h frequency with the same total daily dose (TDD).  Ordered Dose: 1 to 125 mg TDD; convert to: TDD q24h.  Ordered Dose: 126 to 250 mg TDD; convert to: TDD div q12h.  Ordered Dose: >250 mg TDD; DAW.    predniSONE (DELTASONE) tablet 40 mg   mometasone-formoterol (DULERA) 200-5 MCG/ACT inhaler  2 puff   ipratropium-albuterol (DUONEB) 0.5-2.5 (3) MG/3ML nebulizer solution 3 mL   albuterol (PROVENTIL) (2.5 MG/3ML)  0.083% nebulizer solution 2.5 mg   guaiFENesin (MUCINEX) 12 hr tablet 1,200 mg   enoxaparin (LOVENOX) injection 40 mg   OR Linked Order Group    acetaminophen (TYLENOL) tablet 650 mg    acetaminophen (TYLENOL) suppository 650 mg    -I have reviewed the patients home medicines and have made adjustments as needed  Critical interventions Multiple DuoNebs, Solu-Medrol, magnesium  Co morbidities that complicate the patient evaluation  Past Medical History:  Diagnosis Date   Arthritis    hands, knees, back   COPD (chronic obstructive pulmonary disease) ([place])    Depression    Diskitis [id number]   Hardware complicating wound infection ([place]) 02/28/2021   Headache    migraines   Hypertension    Legionella pneumonia ([place]) 02/28/2021   Neuromuscular disorder ([place])    hands   Peripheral vascular disease ([place])    Pre-diabetes    Prostate cancer ([place])    Smoker 11/05/2021   Vaccine counseling 11/05/2021      Dispostion: Patient discussed with hospitalist will evaluate patient for admission.  Final Clinical Impression(s) / ED Diagnoses Final diagnoses:  COPD exacerbation [facility])    Rx / DC Orders ED Discharge Orders     None         Evlyn Courier, PA-C 01/06/23 [place], [place], DO 01/07/23 [phone]

## 2023-01-06 NOTE — H&P (Signed)
History and Physical    Tyrone [name]. [id number] DOB: December 04, 1955 DOA: 01/06/2023  PCP: Wenda Low, MD (Confirm with patient/family/NH records and if not entered, this has to be entered at [facility] point of entry) Patient coming from: Home  I have personally briefly reviewed patient's old medical records in [place]  Chief Complaint: Cough, wheezing, SOB  HPI: Tyrone Ghannam. is a 67 y.o. male with medical history significant of COPD Gold stage II, PVD with multiple intervention in the past, cigarette smoker, HTN, HLD, chronic back pain on narcotics, presented with worsening of cough wheezing shortness of breath.  Patient developed COVID-like symptoms with new onset of cough and congestion and was tested positive for COVID infection.  He completed 5 days course of Paxlovid and 6 days of p.o. steroid last Saturday.  However he continued to experience productive cough with yellow/greenish sputum, wheezing and shortness of breath and came to ED last Sunday, and he was diagnosed with postviral pneumonia and given 4-day course of azithromycin and he completed this Wednesday.  Continuously, increasing his breathing symptoms since getting worse, walking inside his house causing significant shortness of breath.  He reported having initiated quit smoking process and has been on nicotine patch since last Friday.  Despite he still smokes half pack a day.  He is aware he has COPD, but only has as needed inhaler but no maintenance medications.  ED Course: Patient was found to be very tachypneic borderline tachycardia, O2 saturation 96% on room air however desat quickly with short interval walking to 88% on room air.  Chest x-ray showed pulmonary congestion but no significant infiltrates or consolidation.  Review of Systems: As per HPI otherwise 14 point review of systems negative.   Past Medical History:  Diagnosis Date   Arthritis    hands, knees, back   COPD (chronic obstructive  pulmonary disease) ([place])    Depression    Diskitis [id number]   Hardware complicating wound infection ([place]) 02/28/2021   Headache    migraines   Hypertension    Legionella pneumonia ([place]) 02/28/2021   Neuromuscular disorder ([place])    hands   Peripheral vascular disease ([place])    Pre-diabetes    Prostate cancer ([place])    Smoker 11/05/2021   Vaccine counseling 11/05/2021    Past Surgical History:  Procedure Laterality Date   ABDOMINAL AORTOGRAM W/LOWER EXTREMITY Bilateral 10/18/2021   Procedure: ABDOMINAL AORTOGRAM W/LOWER EXTREMITY;  Surgeon: Marty Heck, MD;  Location: [place] [facility];  Service: Cardiovascular;  Laterality: Bilateral;   ABDOMINAL AORTOGRAM W/LOWER EXTREMITY Right 02/28/2022   Procedure: ABDOMINAL AORTOGRAM W/LOWER EXTREMITY;  Surgeon: Marty Heck, MD;  Location: [place] [facility];  Service: Cardiovascular;  Laterality: Right;   APPENDECTOMY     BACK SURGERY  1980"s   L4 to L 5 laminectomy   COLONOSCOPY WITH PROPOFOL N/A 02/06/2016   Procedure: COLONOSCOPY WITH PROPOFOL;  Surgeon: Garlan Fair, MD;  Location: [facility];  Service: Endoscopy;  Laterality: N/A;   DIAGNOSTIC LAPAROSCOPY     ENDARTERECTOMY FEMORAL Left 10/24/2021   Procedure: ENDARTERECTOMY FEMORAL WITH PROFUNDAPLASTY;  Surgeon: Marty Heck, MD;  Location: [place];  Service: Vascular;  Laterality: Left;   ENDARTERECTOMY FEMORAL Right 03/06/2022   Procedure: RIGHT COMMON FEMORAL ENDARTERECTOMY;  Surgeon: Marty Heck, MD;  Location: [place];  Service: Vascular;  Laterality: Right;   FEMORAL-POPLITEAL BYPASS GRAFT Left 10/24/2021   Procedure: LEFT COMMON FEMORAL- BELOW KNEE POPLITEAL BYPASS;  Surgeon:  Marty Heck, MD;  Location: [place];  Service: Vascular;  Laterality: Left;   FEMORAL-POPLITEAL BYPASS GRAFT Right 03/06/2022   Procedure: RIGHT FEMORAL-POPLITEAL BYPASS WITH GORETEC GRAFT;  Surgeon: Marty Heck, MD;  Location: [place];  Service: Vascular;   Laterality: Right;  INSERT ARTERIAL LINE   LAPAROSCOPIC APPENDECTOMY N/A 08/13/2018   Procedure: APPENDECTOMY LAPAROSCOPIC;  Surgeon: Alphonsa Overall, MD;  Location: [facility];  Service: General;  Laterality: N/A;   PATCH ANGIOPLASTY Left 10/24/2021   Procedure: PATCH ANGIOPLASTY;  Surgeon: Marty Heck, MD;  Location: [place];  Service: Vascular;  Laterality: Left;   PELVIC LYMPH NODE DISSECTION Bilateral 02/08/2019   Procedure: PELVIC LYMPH NODE DISSECTION;  Surgeon: Lucas Mallow, MD;  [facility]: [facility];  Service: Urology;  Laterality: Bilateral;   ROBOT ASSISTED LAPAROSCOPIC RADICAL PROSTATECTOMY N/A 02/08/2019   Procedure: XI ROBOTIC ASSISTED LAPAROSCOPIC RADICAL PROSTATECTOMY;  Surgeon: Lucas Mallow, MD;  Location: [facility];  Service: Urology;  Laterality: N/A;   TRANSFORAMINAL LUMBAR INTERBODY FUSION (TLIF) WITH PEDICLE SCREW FIXATION 1 LEVEL Left 09/12/2020   Procedure: LEFT-SIDED LUMBAR 1 - LUMBAR 2 TRANSFORAMINAL LUMBAR INTERBODY FUSION WITH INSTRUMENTATION AND ALLOGRAFT;  Surgeon: Phylliss Bob, MD;  Location: [facility];  Service: Orthopedics;  Laterality: Left;  3C BED   VEIN HARVEST Left 10/24/2021   Procedure: VEIN HARVEST;  Surgeon: Marty Heck, MD;  Location: [facility];  Service: Vascular;  Laterality: Left;   WRIST SURGERY Right    x 2      reports that he quit smoking about 11 months ago. His smoking use included cigarettes. He smoked an average of 1 pack per day. He has never used smokeless tobacco. He reports that he does not currently use alcohol. He reports that he does not use drugs.  Allergies  Allergen Reactions   Neurontin [Gabapentin] Itching and Other (See Comments)    Confusion and sedation.   Celexa [Citalopram Hydrobromide]     restless leg/insomnia worse   Topamax [Topiramate]     numbness in hand    Family History  Problem Relation Age of Onset   Colon cancer Maternal Grandmother    Colon cancer Maternal Great-grandmother    Prostate cancer Neg  Hx      Prior to Admission medications   Medication Sig Start Date End Date Taking? Authorizing Provider  aspirin 325 MG tablet Take 325 mg by mouth every evening.    [provider]  atorvastatin (LIPITOR) 40 MG tablet Take 1 tablet (40 mg total) by mouth daily. Patient taking differently: Take 40 mg by mouth at bedtime. 10/28/21 10/28/22  Cherre Robins, MD  azithromycin (ZITHROMAX) 250 MG tablet Take 1 tablet (250 mg total) by mouth daily. 12/31/22   Hayden Rasmussen, MD  ezetimibe (ZETIA) 10 MG tablet Take 1 tablet (10 mg total) by mouth daily. 03/08/22   Rhyne, Hulen Shouts, PA-C  morphine (MS CONTIN) 30 MG 12 hr tablet Take 30 mg by mouth every 12 (twelve) hours. 06/21/21   [provider]  morphine (MSIR) 15 MG tablet Take 15 mg by mouth every 6 (six) hours as needed for moderate pain (PAIN.). 03/28/21   [provider]  Multiple Vitamin (MULTIVITAMIN WITH MINERALS) TABS tablet Take 1 tablet by mouth every evening. Centrum Silver    [provider]  nicotine (NICODERM CQ - DOSED IN MG/24 HOURS) 14 mg/24hr patch Place 14 mg onto the skin daily.    [provider]  oxymetazoline Melissa Montane)  0.05 % nasal spray Place 1 spray into both nostrils 2 (two) times daily as needed for congestion.    [provider]  varenicline (CHANTIX) 1 MG tablet Take 1 tablet (1 mg total) by mouth 2 (two) times daily. Take 0.5 mg daily for 3 days.  Then 0.5 mg twice daily for 4 days.  Then 1 mg twice daily for continuing therapy. 07/09/22   Marty Heck, MD    Physical Exam: Vitals:   01/06/23 1149 01/06/23 1300 01/06/23 1430  BP: (!) 145/86 (!) 151/85 109/79  Pulse: 100 89 95  Resp: (!) 24 20 12   Temp: 97.7 F (36.5 C)    TempSrc: Oral    SpO2: 96% 96% 100%    Constitutional: NAD, calm, comfortable Vitals:   01/06/23 1149 01/06/23 1300 01/06/23 1430  BP: (!) 145/86 (!) 151/85 109/79  Pulse: 100 89 95  Resp: (!) 24 20 12   Temp: 97.7 F (36.5 C)     TempSrc: Oral    SpO2: 96% 96% 100%   Eyes: PERRL, lids and conjunctivae normal ENMT: Mucous membranes are moist. Posterior pharynx clear of any exudate or lesions.Normal dentition.  Neck: normal, supple, no masses, no thyromegaly Respiratory: clear to auscultation bilaterally, diffused wheezing and scattered crackles bilaterally, increasing breathing effort. No accessory muscle use.  Cardiovascular: Regular rate and rhythm, no murmurs / rubs / gallops. No extremity edema. 2+ pedal pulses. No carotid bruits.  Abdomen: no tenderness, no masses palpated. No hepatosplenomegaly. Bowel sounds positive.  Musculoskeletal: no clubbing / cyanosis. No joint deformity upper and lower extremities. Good ROM, no contractures. Normal muscle tone.  Skin: no rashes, lesions, ulcers. No induration Neurologic: CN 2-12 grossly intact. Sensation intact, DTR normal. Strength 5/5 in all 4.  Psychiatric: Normal judgment and insight. Alert and oriented x 3. Normal mood.     Labs on Admission: I have personally reviewed following labs and imaging studies  CBC: Recent Labs  Lab 12/31/22 1820 01/06/23 1300  WBC 4.9 9.9  NEUTROABS  --  5.7  HGB 15.9 16.5  HCT 45.6 47.1  MCV 87.5 86.9  PLT 203 [id number]   Basic Metabolic Panel: Recent Labs  Lab 12/31/22 1820 01/06/23 1300  NA 136 134*  K 4.1 4.2  CL 99 97*  CO2 28 25  GLUCOSE 127* 108*  BUN 14 14  CREATININE 0.88 0.86  CALCIUM 9.0 8.9   GFR: Estimated Creatinine Clearance: 95.7 mL/min (by C-G formula based on SCr of 0.86 mg/dL). Liver Function Tests: Recent Labs  Lab 01/06/23 1300  AST 29  ALT 33  ALKPHOS 80  BILITOT 0.7  PROT 6.5  ALBUMIN 3.6   No results for input(s): "LIPASE", "AMYLASE" in the last 168 hours. No results for input(s): "AMMONIA" in the last 168 hours. Coagulation Profile: No results for input(s): "INR", "PROTIME" in the last 168 hours. Cardiac Enzymes: No results for input(s): "CKTOTAL", "CKMB", "CKMBINDEX", "TROPONINI"  in the last 168 hours. BNP (last 3 results) No results for input(s): "PROBNP" in the last 8760 hours. HbA1C: No results for input(s): "HGBA1C" in the last 72 hours. CBG: No results for input(s): "GLUCAP" in the last 168 hours. Lipid Profile: No results for input(s): "CHOL", "HDL", "LDLCALC", "TRIG", "CHOLHDL", "LDLDIRECT" in the last 72 hours. Thyroid Function Tests: No results for input(s): "TSH", "T4TOTAL", "FREET4", "T3FREE", "THYROIDAB" in the last 72 hours. Anemia Panel: No results for input(s): "VITAMINB12", "FOLATE", "FERRITIN", "TIBC", "IRON", "RETICCTPCT" in the last 72 hours. Urine analysis:    Component Value  Date/Time   COLORURINE YELLOW 10/24/2021 [place] 10/24/2021 0847   LABSPEC >1.030 (H) 10/24/2021 0847   PHURINE 6.0 10/24/2021 0847   GLUCOSEU NEGATIVE 10/24/2021 0847   HGBUR TRACE (A) 10/24/2021 0847   BILIRUBINUR NEGATIVE 10/24/2021 0847   [place] 10/24/2021 0847   PROTEINUR NEGATIVE 10/24/2021 0847   NITRITE NEGATIVE 10/24/2021 0847   LEUKOCYTESUR NEGATIVE 10/24/2021 0847    Radiological Exams on Admission: DG Chest 2 View  Result Date: 01/06/2023 CLINICAL DATA:  Shortness of breath EXAM: CHEST - 2 VIEW COMPARISON:  12/31/2022 FINDINGS: Cardiac size is within normal limits. Flattening of diaphragms suggests COPD. In the lateral view, increased markings are seen in posterior lower lung field. There is blunting of left lateral CP angle. There is slight improvement in aeration of lower lung fields. There is no pneumothorax. IMPRESSION: Increased density seen in posterior left lower lung field may suggest atelectasis/pneumonia. There is interval improvement in aeration in both lower lung fields. No new infiltrates are seen. Blunting of left lateral CP angle may be due to pleural thickening or minimal effusion. Electronically Signed   By: Elmer Picker M.D.   On: 01/06/2023 12:59    EKG: Independently reviewed.  Sinus rhythm, no  acute ST changes.  Assessment/Plan Principal Problem:   COPD (chronic obstructive pulmonary disease) (HCC) Active Problems:   Smoker   COPD with acute exacerbation (HCC)   PNA (pneumonia)  (please populate well all problems here in Problem List. (For example, if patient is on BP meds at home and you resume or decide to hold them, it is a problem that needs to be her. Same for CAD, COPD, HLD and so on)  Acute COPD exacerbation -Likely triggered by her COVID infection -Clinically patient still has signs and symptoms of ongoing bronchitis, despite 4 days course of azithromycin.  Decided to initiate patient on doxycycline -Check sputum culture -IV steroid today bridging for p.o. prednisone tomorrow -Initiate ICS and LABA -DuoNebs and as needed albuterol -Will need outpatient pulmonary follow-up and renew lung function test.  Explained to the patient and his wife at bedside, both expressed understanding and agreed. -Incentive spirometry and flutter valve -Ambulation pulse ox every shift, may consider short course of home oxygen -Patient is aware importance of quit smoking  Chronic cigarette smoker -Cessation education performed at bedside -Continue nicotine patch and Chantix  Elevated lactate -2/2 COPD exacerbation, low suspicion for sepsis. -Antibiotics for acute bronchitis  History of chronic back pain and narcotic dependence -Continue home regimen of narcotics  PAD -No acute issue, continue aspirin and statin  DVT prophylaxis: Lovenox Code Status: Full code Family Communication: Wife at bedside Disposition Plan: Expect less than 2 midnight hospital stay Consults called: None Admission status: Medsurg obs   Lequita Halt MD [facility] Pager [phone]  01/06/2023, 3:21 PM

## 2023-01-06 NOTE — ED Provider Triage Note (Signed)
Emergency Medicine Provider Triage Evaluation Note  Tyrone [name] , a 67 y.o. male  was evaluated in triage.  Pt complains of shortness of breath onset 1 week.  Notes he was just diagnosed with COVID 1 week ago.  Was started on Paxlovid, steroids, antibiotics.  Denies chest pain, nausea, vomiting. Pt denies recent travel, immobilization, surgery, anticoagulant use, or PMHx of PE/DVT.  Has a past medical history of COPD and has been using his inhaler without relief of symptoms.  Denies past medical history of asthma or CHF.  No oxygen use at home.  Review of Systems  Positive:  Negative:   Physical Exam  BP (!) 145/86   Pulse 100   Temp 97.7 F (36.5 C) (Oral)   Resp (!) 24   SpO2 96%  Gen:   Awake, no distress   Resp:  Normal effort  MSK:   Moves extremities without difficulty  Other:  Expiratory wheezing noted throughout all lung fields.  No appreciable leg swelling noted bilaterally.  Medical Decision Making  Medically screening exam initiated at 12:14 PM.  Appropriate orders placed.  Tyrone [name]. was informed that the remainder of the evaluation will be completed by another provider, this initial triage assessment does not replace that evaluation, and the importance of remaining in the ED until their evaluation is complete.  Work-up initiated.    Demika Langenderfer A, PA-C 01/06/23 1218

## 2023-01-07 DIAGNOSIS — J441 Chronic obstructive pulmonary disease with (acute) exacerbation: Secondary | ICD-10-CM | POA: Diagnosis not present

## 2023-01-07 LAB — HIV ANTIBODY (ROUTINE TESTING W REFLEX): HIV Screen 4th Generation wRfx: NONREACTIVE

## 2023-01-07 MED ORDER — BUDESONIDE 0.25 MG/2ML IN SUSP
RESPIRATORY_TRACT | Status: AC
  Filled 2023-01-07: qty 2

## 2023-01-07 MED ORDER — CEFDINIR 300 MG PO CAPS: 300.0000 mg | ORAL_CAPSULE | Freq: Two times a day (BID) | ORAL | 0 refills | Status: AC

## 2023-01-07 MED ORDER — IPRATROPIUM-ALBUTEROL 0.5-2.5 (3) MG/3ML IN SOLN: 3.0000 mL | Freq: Two times a day (BID) | RESPIRATORY_TRACT | 0 refills | Status: AC

## 2023-01-07 MED ORDER — PREDNISONE 20 MG PO TABS: ORAL_TABLET | ORAL | 0 refills | Status: DC

## 2023-01-07 MED ORDER — IPRATROPIUM-ALBUTEROL 0.5-2.5 (3) MG/3ML IN SOLN: 3.0000 mL | Freq: Two times a day (BID) | RESPIRATORY_TRACT | Status: DC

## 2023-01-07 MED ORDER — GUAIFENESIN ER 600 MG PO TB12: 1200.0000 mg | ORAL_TABLET | Freq: Two times a day (BID) | ORAL | Status: DC

## 2023-01-07 NOTE — Discharge Summary (Signed)
Discharge Summary  Tyrone [name]. [id number] DOB: 08-Nov-1955  PCP: Wenda Low, MD  Admit date: 01/06/2023 Discharge date: 01/07/2023    Time spent: . 44mins  Recommendations for Outpatient Follow-up:  F/u with PCP within a week  for hospital discharge follow up, repeat cbc/bmp at follow up, pcp to repeat cxr in 2-3 weeks Pulmonology for PFT/copd management    Discharge Diagnoses:  Active Hospital Problems   Diagnosis Date Noted   COPD (chronic obstructive pulmonary disease) ([place]) 01/06/2023   COPD with acute exacerbation ([place]) 01/06/2023   PNA (pneumonia) 01/06/2023   Smoker 11/05/2021    Resolved Hospital Problems  No resolved problems to display.    Discharge Condition: stable  Diet recommendation: heart healthy  Filed Weights   01/06/23 2001  Weight: 90.7 kg    History of present illness: (per admitting MD Dr Roosevelt Locks) PCP: Wenda Low, MD (Confirm with patient/family/NH records and if not entered, this has to be entered at [facility] point of entry) Patient coming from: Home   I have personally briefly reviewed patient's old medical records in [place]   Chief Complaint: Cough, wheezing, SOB   HPI: Tyrone [name]. is a 67 y.o. male with medical history significant of COPD Gold stage II, PVD with multiple intervention in the past, cigarette smoker, HTN, HLD, chronic back pain on narcotics, presented with worsening of cough wheezing shortness of breath.   Patient developed COVID-like symptoms with new onset of cough and congestion and was tested positive for COVID infection.  He completed 5 days course of Paxlovid and 6 days of p.o. steroid last Saturday.  However he continued to experience productive cough with yellow/greenish sputum, wheezing and shortness of breath and came to ED last Sunday, and he was diagnosed with postviral pneumonia and given 4-day course of azithromycin and he completed this Wednesday.  Continuously, increasing his  breathing symptoms since getting worse, walking inside his house causing significant shortness of breath.  He reported having initiated quit smoking process and has been on nicotine patch since last Friday.  Despite he still smokes half pack a day.  He is aware he has COPD, but only has as needed inhaler but no maintenance medications.   ED Course: Patient was found to be very tachypneic borderline tachycardia, O2 saturation 96% on room air however desat quickly with short interval walking to 88% on room air.  Chest x-ray showed pulmonary congestion but no significant infiltrates or consolidation.  Hospital Course:  Principal Problem:   COPD (chronic obstructive pulmonary disease) (HCC) Active Problems:   Smoker   COPD with acute exacerbation (HCC)   PNA (pneumonia)   Assessment and Plan:  COPD exacerbation Likely triggered by recent covid infection ( tested positive on 3/15),  was treated with paxlovid/steroids from 3/16 He was seen in the ED due to cough and sob on 3/19 was given zithromycin, reports continued to feel sob but no hypoxia , he presents to the Ed on 3/25, he was found to have wheezing on presentation , cxr "Increased density seen in posterior left lower lung field may suggest atelectasis/pneumonia. There is interval improvement in aeration in both lower lung fields. No new infiltrates are seen. Blunting of left lateral CP angle may be due to pleural thickening or minimal effusion."  he received solumedrol by ems and in the ED, then changed to prednisone, He has no fever, no leukocytosis, no hypoxia, he did have mild elevated lactic acid which has normalized, wheezing has resolved,  continue to have dry cough, no hypoxia, he reports walked the hallway without hypoxia, he desires to go home , he is discharged on omnicef/prednisone taper and nebulizer ( reports nebulizer helped), he is advised to follow up with pcp this week and also follow up with pulmonology   Chronic cigarette  smoker -Cessation education performed at bedside -Continue nicotine patch      History of chronic back pain and narcotic dependence -Continue home regimen of narcotics   PAD -No acute issue, continue aspirin and statin   Discharge Exam: BP 138/86 (BP Location: Right Arm)   Pulse (!) 101   Temp 98.1 F (36.7 C) (Oral)   Resp 18   Ht 5\' 10"  (1.778 m)   Wt 90.7 kg   SpO2 96%   BMI 28.69 kg/m   General: NAD Cardiovascular: RRR Respiratory: normal respiratory effort     Discharge Instructions     Diet - low sodium heart healthy   Complete by: As directed    For home use only DME Nebulizer machine   Complete by: As directed    Patient needs a nebulizer to treat with the following condition: COPD (chronic obstructive pulmonary disease) ([place])   Length of Need: 6 Months   Increase activity slowly   Complete by: As directed       Allergies as of 01/07/2023       Reactions   Neurontin [gabapentin] Itching, Other (See Comments)   Confusion and sedation.   Celexa [citalopram Hydrobromide]    restless leg/insomnia worse   Topamax [topiramate]    numbness in hand        Medication List     STOP taking these medications    azithromycin 250 MG tablet Commonly known as: ZITHROMAX   varenicline 1 MG tablet Commonly known as: Chantix       TAKE these medications    aspirin 325 MG tablet Take 325 mg by mouth every evening.   atorvastatin 40 MG tablet Commonly known as: LIPITOR Take 1 tablet (40 mg total) by mouth daily.   cefdinir 300 MG capsule Commonly known as: OMNICEF Take 1 capsule (300 mg total) by mouth 2 (two) times daily for 5 days.   ezetimibe 10 MG tablet Commonly known as: ZETIA Take 1 tablet (10 mg total) by mouth daily.   guaiFENesin 600 MG 12 hr tablet Commonly known as: MUCINEX Take 2 tablets (1,200 mg total) by mouth 2 (two) times daily.   ipratropium-albuterol 0.5-2.5 (3) MG/3ML Soln Commonly known as: DUONEB Take 3 mLs by  nebulization 2 (two) times daily.   morphine 15 MG tablet Commonly known as: MSIR Take 15 mg by mouth every 6 (six) hours as needed for moderate pain (PAIN.).   multivitamin with minerals Tabs tablet Take 1 tablet by mouth every evening. Centrum Silver   nicotine 14 mg/24hr patch Commonly known as: NICODERM CQ - dosed in mg/24 hours Place 14 mg onto the skin daily.   oxymetazoline 0.05 % nasal spray Commonly known as: AFRIN Place 1 spray into both nostrils 2 (two) times daily as needed for congestion.   predniSONE 20 MG tablet Commonly known as: DELTASONE Label  & dispense according to the schedule below. 5 Pills PO on day one then, 4 Pills PO on day two, 3 Pills PO on day three, 2Pills PO on day four, 1 Pill PO on day five,  then STOP.  Total of 15 tabs Start taking on: January 08, 2023  Durable Medical Equipment  (From admission, onward)           Start     Ordered   01/07/23 0000  For home use only DME Nebulizer machine       Question Answer Comment  Patient needs a nebulizer to treat with the following condition COPD (chronic obstructive pulmonary disease) ([place])   Length of Need 6 Months      01/07/23 1018           Allergies  Allergen Reactions   Neurontin [Gabapentin] Itching and Other (See Comments)    Confusion and sedation.   Celexa [Citalopram Hydrobromide]     restless leg/insomnia worse   Topamax [Topiramate]     numbness in hand    Follow-up Information     Wenda Low, MD Follow up in 1 week(s).   Specialty: Internal Medicine Why: hospital discahrge follow up, pcp to repeat cxr in 2-3 weeks Contact information: [address] [place] [address] [phone]         [facility] Follow up.   Specialty: Pulmonology Why: for lung function test Contact information: [address] [place] [id number] [phone]                  The results of significant diagnostics from this hospitalization (including imaging, microbiology, ancillary and laboratory) are listed below for reference.    Significant Diagnostic Studies: DG Chest 2 View  Result Date: 01/06/2023 CLINICAL DATA:  Shortness of breath EXAM: CHEST - 2 VIEW COMPARISON:  12/31/2022 FINDINGS: Cardiac size is within normal limits. Flattening of diaphragms suggests COPD. In the lateral view, increased markings are seen in posterior lower lung field. There is blunting of left lateral CP angle. There is slight improvement in aeration of lower lung fields. There is no pneumothorax. IMPRESSION: Increased density seen in posterior left lower lung field may suggest atelectasis/pneumonia. There is interval improvement in aeration in both lower lung fields. No new infiltrates are seen. Blunting of left lateral CP angle may be due to pleural thickening or minimal effusion. Electronically Signed   By: Elmer Picker M.D.   On: 01/06/2023 12:59   DG Chest 2 View  Result Date: 12/31/2022 CLINICAL DATA:  Shortness of breath. COVID positive yesterday. History of COPD. EXAM: CHEST - 2 VIEW COMPARISON:  Chest and rib radiographs 11/30/2015. Lung bases from abdominal CT 12/05/2020 FINDINGS: Chronic hyperinflation with emphysema and interstitial coarsening. Chronic scarring in the left lung base. Blunting of the costophrenic angles is favored to represent hyperinflation rather than pleural effusion. Ill-defined patchy opacity in both lung bases. No pneumothorax. The heart is normal in size. Stable mediastinal contours. IMPRESSION: 1. Ill-defined patchy opacity in both lung bases, may represent atelectasis or infection in the setting of COVID 19. 2. Chronic hyperinflation, emphysema and interstitial coarsening. Chronic peripheral scarring at the left lung base. Electronically Signed   By: Keith Rake M.D.   On: 12/31/2022 18:52    Microbiology: No results found for this or any  previous visit (from the past 240 hour(s)).   Labs: Basic Metabolic Panel: Recent Labs  Lab 12/31/22 1820 01/06/23 1300  NA 136 134*  K 4.1 4.2  CL 99 97*  CO2 28 25  GLUCOSE 127* 108*  BUN 14 14  CREATININE 0.88 0.86  CALCIUM 9.0 8.9   Liver Function Tests: Recent Labs  Lab 01/06/23 1300  AST 29  ALT 33  ALKPHOS 80  BILITOT 0.7  PROT 6.5  ALBUMIN 3.6   No results for input(s): "LIPASE", "AMYLASE" in the last 168 hours. No results for input(s): "AMMONIA" in the last 168 hours. CBC: Recent Labs  Lab 12/31/22 1820 01/06/23 1300  WBC 4.9 9.9  NEUTROABS  --  5.7  HGB 15.9 16.5  HCT 45.6 47.1  MCV 87.5 86.9  PLT 203 287   Cardiac Enzymes: No results for input(s): "CKTOTAL", "CKMB", "CKMBINDEX", "TROPONINI" in the last 168 hours. BNP: BNP (last 3 results) No results for input(s): "BNP" in the last 8760 hours.  ProBNP (last 3 results) No results for input(s): "PROBNP" in the last 8760 hours.  CBG: No results for input(s): "GLUCAP" in the last 168 hours.  FURTHER DISCHARGE INSTRUCTIONS:   Get Medicines reviewed and adjusted: Please take all your medications with you for your next visit with your Primary MD   Laboratory/radiological data: Please request your Primary MD to go over all hospital tests and procedure/radiological results at the follow up, please ask your Primary MD to get all Hospital records sent to his/her office.   In some cases, they will be blood work, cultures and biopsy results pending at the time of your discharge. Please request that your primary care M.D. goes through all the records of your hospital data and follows up on these results.   Also Note the following: If you experience worsening of your admission symptoms, develop shortness of breath, life threatening emergency, suicidal or homicidal thoughts you must seek medical attention immediately by calling 911 or calling your MD immediately  if symptoms less severe.   You must read  complete instructions/literature along with all the possible adverse reactions/side effects for all the Medicines you take and that have been prescribed to you. Take any new Medicines after you have completely understood and accpet all the possible adverse reactions/side effects.    Do not drive when taking Pain medications or sleeping medications (Benzodaizepines)   Do not take more than prescribed Pain, Sleep and Anxiety Medications. It is not advisable to combine anxiety,sleep and pain medications without talking with your primary care practitioner   Special Instructions: If you have smoked or chewed Tobacco  in the last 2 yrs please stop smoking, stop any regular Alcohol  and or any Recreational drug use.   Wear Seat belts while driving.   Please note: You were cared for by a hospitalist during your hospital stay. Once you are discharged, your primary care physician will handle any further medical issues. Please note that NO REFILLS for any discharge medications will be authorized once you are discharged, as it is imperative that you return to your primary care physician (or establish a relationship with a primary care physician if you do not have one) for your post hospital discharge needs so that they can reassess your need for medications and monitor your lab values.     Signed:  Florencia Reasons MD, PhD, FACP  [facility] 01/07/2023, 10:21 AM

## 2023-01-07 NOTE — Progress Notes (Signed)
Spoke with patient's RN. He is awaiting a nebulizer delivery to room for home use. Wife is at the bedside to transport downstairs.

## 2023-01-07 NOTE — TOC CM/SW Note (Signed)
  Transition of Care [facility]) Screening Note   Patient Details  Name: Tyrone [name]. Date of Birth: 05/24/1956   [facility] of [facility]) CM/SW Contact:    Marilu Favre, RN Phone Number: 01/07/2023, 10:29 AM  Ordered NEB machine from [place] with [place]   Transition of Care Department [facility]) has reviewed patient and no TOC needs have been identified at this time. We will continue to monitor patient advancement through interdisciplinary progression rounds. If new patient transition needs arise, please place a TOC consult.

## 2023-01-07 NOTE — Progress Notes (Signed)
Tyrone [name]. to be D/C'd  per MD order.  Discussed with the patient and all questions fully answered.  VSS, Skin clean, dry and intact without evidence of skin break down, no evidence of skin tears noted.  IV catheter discontinued intact. Site without signs and symptoms of complications. Dressing and pressure applied.  An After Visit Summary was printed and given to the patient. Patient received prescription script and his nebulizer.  D/c education completed with patient/family including follow up instructions, medication list, d/c activities limitations if indicated, with other d/c instructions as indicated by MD - patient able to verbalize understanding, all questions fully answered.   Patient instructed to return to ED, call 911, or call MD for any changes in condition.   Patient to be escorted via [place], and D/C home via private auto.

## 2023-01-15 IMAGING — XA DG FLUORO GUIDE NDL PLC/BX
2 series · 2 of 2 positions shown · non-contrast
Comparison: MRI of the lumbar spine 02/19/2021

INDICATION: Abnormal disc in marrow signal at L1-2 fusion. Progressive pain and
MRI changes.

EXAM:
FLUORO GUIDED NEEDLE PLACEMENT AND/OR ASPIRATION

[Series 2: ortho adipose · 1 of 1 slices shown (1 of 2)]
[im 1/1]
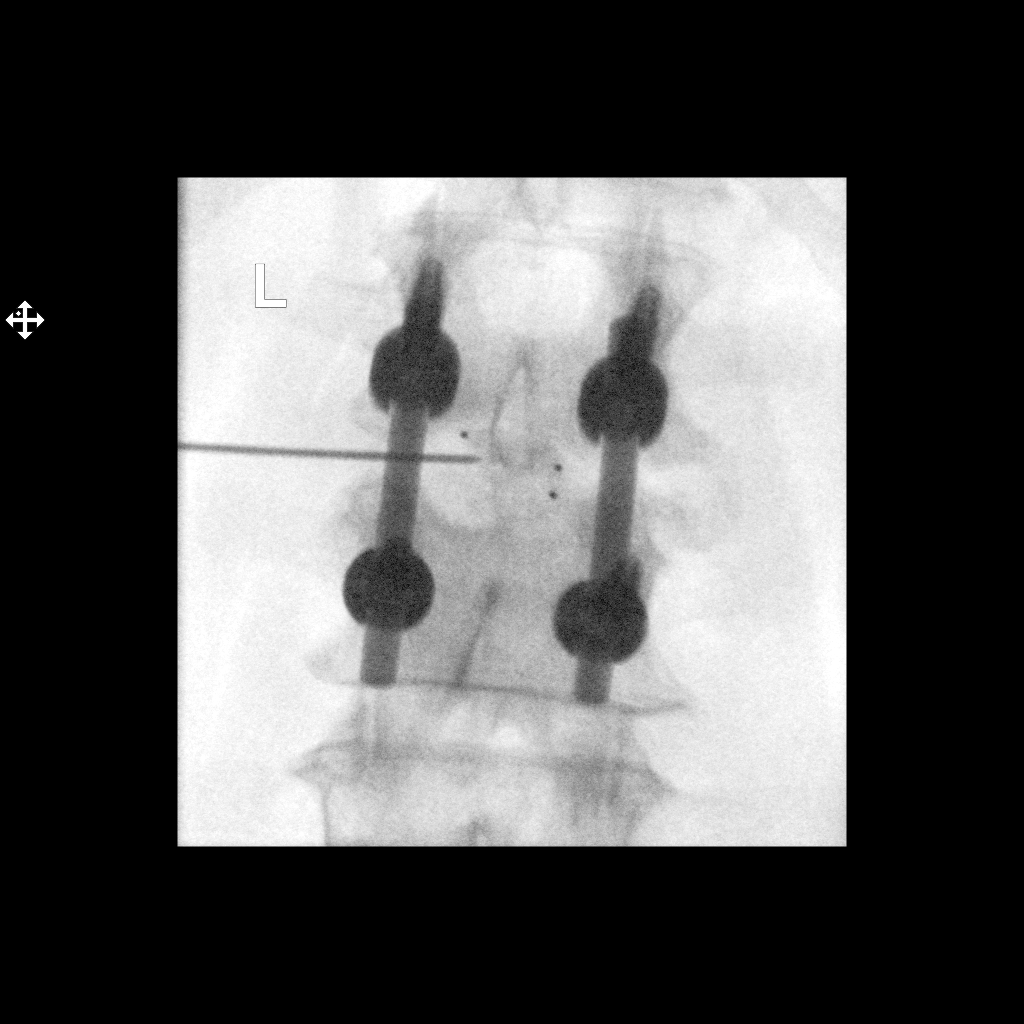

[Series 3: ortho adipose · 1 of 1 slices shown (2 of 2)]
[im 1/1]
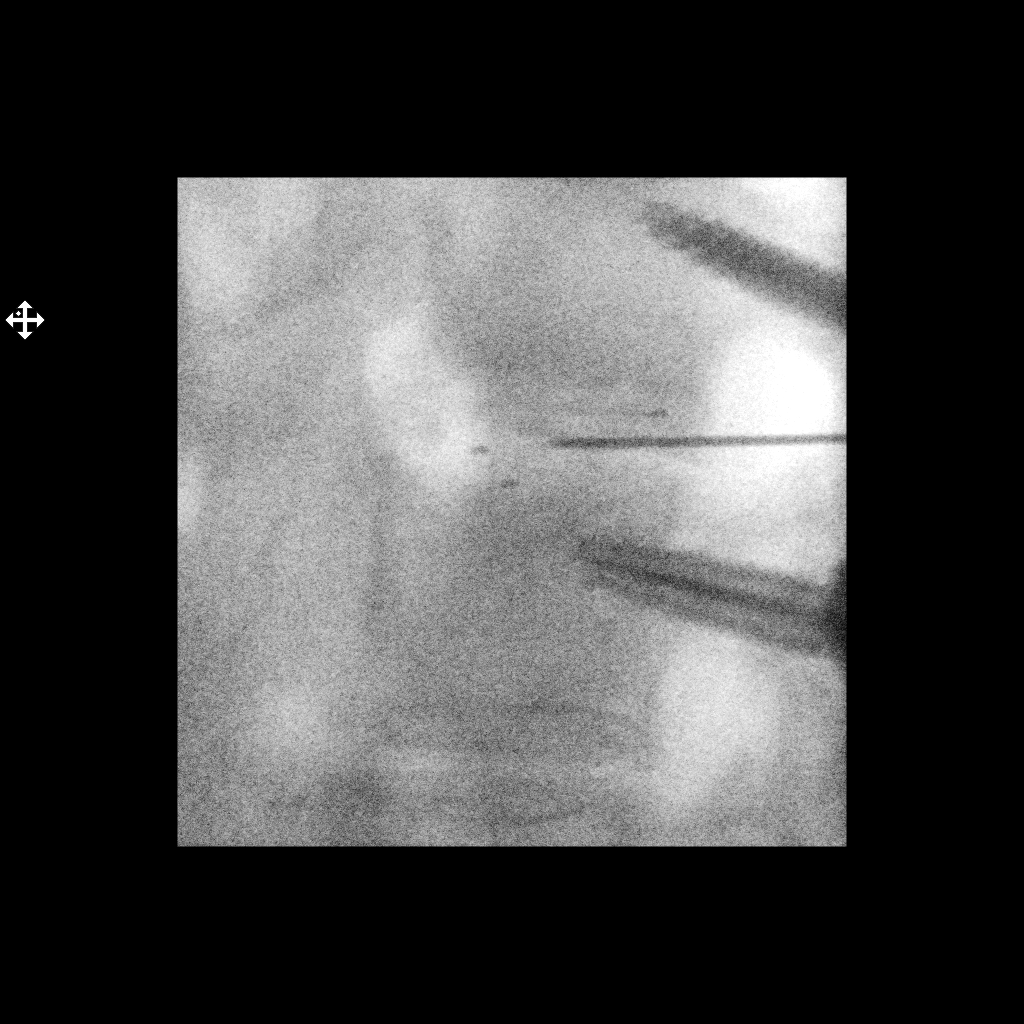

[2 of 2 positions shown; findings below may reference images not displayed]

MEDICATIONS:
None

ANESTHESIA/SEDATION:
Moderate (conscious) sedation was employed during this procedure. A
total of Fentanyl 100 mcg was administered intravenously. A total of
2 mg of Versed was administered intravenously. 30 mg of Toradol was
administered at the end of the procedure. If ceased

Moderate Sedation Time: 16 minutes. The patient's level of
consciousness and vital signs were monitored continuously by
radiology nursing throughout the procedure under my direct
supervision.

CONTRAST:  None

FLUOROSCOPY TIME:  Radiation Exposure Index (as provided by the
fluoroscopic device): 97.77 uGy*m2

COMPLICATIONS:
None immediate.

PROCEDURE:
Informed written consent was obtained from Mr. Klever After a
discussion of the risks, benefits and alternatives to treatment. A
timeout was performed prior to the initiation of the procedure. The
patient was positioned prone on the fluoroscopy table. The L1-2
intervertebral disc space was marked fluoroscopically.

Utilizing an oblique, left-sided approach, a 18 gauge trocar needle
was a advanced into the targeted intervertebral disc. Appropriate
positioning was confirmed with the fluoroscopic imaging in both the
anterior and lateral projections. Multiple spot fluoroscopic images
were saved for procedural documentation purposes.

With the needle appropriate position, approximately 0.5 cc of fluid
was aspirated from the disc. All fluid was capped and sent to the
laboratory for analysis. I then injected 3 mL of sterile saline. I
was able to aspirate 1 additional cc of fluid that was sent for
laboratory analysis.

The needle was removed and superficial hemostasis was achieved with
manual compression. A dressing was placed. The patient tolerated the
procedure well without immediate postprocedural complication.
IMPRESSION: Technically successful fluoroscopic guided L1-2 disc aspiration.

## 2023-01-16 IMAGING — XA IR FLUORO GUIDE CV LINE*L*
2 series · 2 of 2 positions shown · IV contrast (agent unspecified)
Comparison: none

INDICATION: Discitis, requiring long-term IV antibiotics. Request for PICC line
placement.

EXAM:
ULTRASOUND AND FLUOROSCOPIC GUIDED PICC LINE INSERTION
MEDICATIONS:
1% lidocaine
CONTRAST:  None
FLUOROSCOPY TIME:  24 seconds (4 mGy)
COMPLICATIONS:
None immediate.
TECHNIQUE: The procedure, risks, benefits, and alternatives were explained to
the patient and informed written consent was obtained.

[Series 2: single · 1 of 1 slices shown]
[im 1/1]
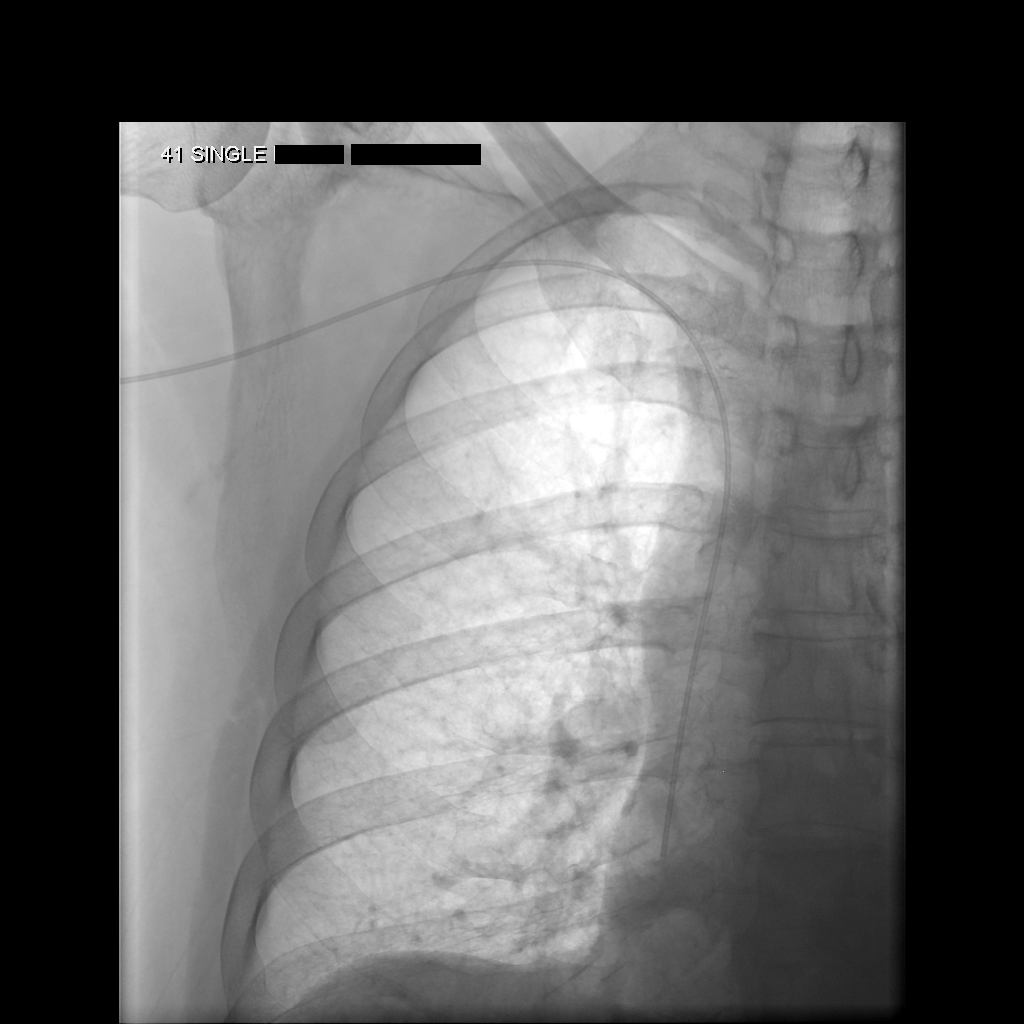

[Series 300: ir fluoro guide cv line left · 1 of 1 slices shown]
[im 1/1]
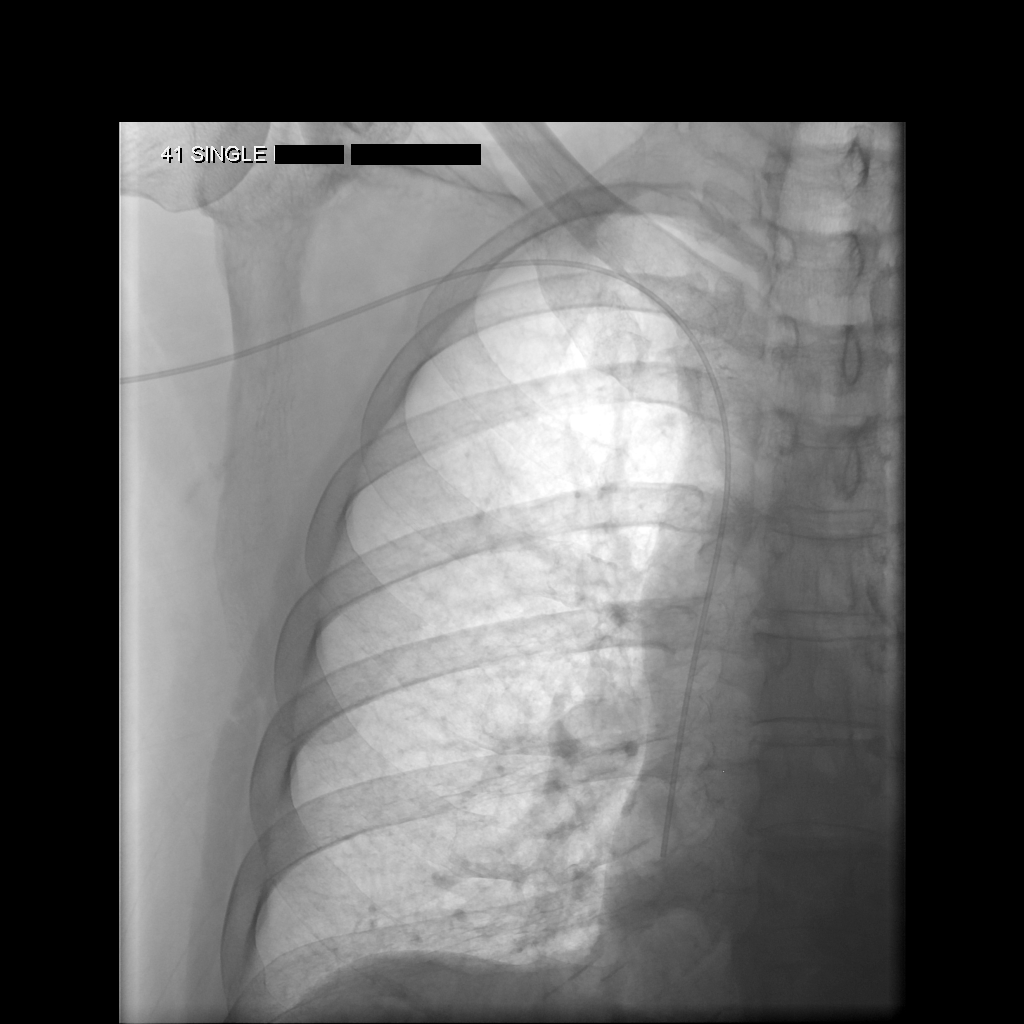

[2 of 2 positions shown; findings below may reference images not displayed]

The right upper extremity was prepped with chlorhexidine in a
sterile fashion, and a sterile drape was applied covering the
operative field. Maximum barrier sterile technique with sterile
gowns and gloves were used for the procedure. A timeout was
performed prior to the initiation of the procedure. Local anesthesia
was provided with 1% lidocaine.

After the overlying soft tissues were anesthetized with 1%
lidocaine, a micropuncture kit was utilized to access the right
brachial vein. Real-time ultrasound guidance was utilized for
vascular access including the acquisition of a permanent ultrasound
image documenting patency of the accessed vessel.

A guidewire was advanced to the level of the superior caval-atrial
junction for measurement purposes and the PICC line was cut to
length. A peel-away sheath was placed and a 41 cm, 5 French, single
lumen was inserted to level of the superior caval-atrial junction. A
post procedure spot fluoroscopic was obtained. The catheter easily
aspirated and flushed and was secured in place. A dressing was
placed. The patient tolerated the procedure well without immediate
post procedural complication.
FINDINGS: After catheter placement, the tip lies within the superior
cavoatrial junction. The catheter aspirates and flushes normally and
is ready for immediate use.
IMPRESSION: Successful ultrasound and fluoroscopic guided placement of a right
brachial vein approach, 41 cm, 5 French, single lumen PICC with tip
at the superior caval-atrial junction. The PICC line is ready for
immediate use.

## 2023-03-10 ENCOUNTER — Other Ambulatory Visit: Payer: Self-pay | Admitting: Physician Assistant

## 2023-09-05 ENCOUNTER — Other Ambulatory Visit: Payer: Self-pay

## 2023-09-05 DIAGNOSIS — I739 Peripheral vascular disease, unspecified: Secondary | ICD-10-CM

## 2023-09-19 ENCOUNTER — Ambulatory Visit (HOSPITAL_COMMUNITY)
Admission: RE | Admit: 2023-09-19 | Discharge: 2023-09-19 | Disposition: A | Discharge status: Home / Self Care | Payer: Medicare Other | Source: Ambulatory Visit | Attending: Vascular Surgery | Admitting: Vascular Surgery

## 2023-09-19 ENCOUNTER — Ambulatory Visit (INDEPENDENT_AMBULATORY_CARE_PROVIDER_SITE_OTHER)
Admission: RE | Admit: 2023-09-19 | Discharge: 2023-09-19 | Disposition: A | Discharge status: Home / Self Care | Payer: Medicare Other | Source: Ambulatory Visit | Attending: Vascular Surgery

## 2023-09-19 DIAGNOSIS — I739 Peripheral vascular disease, unspecified: Secondary | ICD-10-CM | POA: Diagnosis present

## 2023-09-19 LAB — VAS US ABI WITH/WO TBI
Left ABI: 0.88
Right ABI: 0.43

## 2023-09-23 ENCOUNTER — Ambulatory Visit (INDEPENDENT_AMBULATORY_CARE_PROVIDER_SITE_OTHER): Payer: Medicare Other | Admitting: Vascular Surgery

## 2023-09-23 ENCOUNTER — Encounter: Payer: Self-pay | Admitting: Vascular Surgery

## 2023-09-23 VITALS — BP 138/83 | HR 95 | Temp 98.0°F | Ht 70.0 in | Wt 231.7 lb

## 2023-09-23 DIAGNOSIS — I70221 Atherosclerosis of native arteries of extremities with rest pain, right leg: Secondary | ICD-10-CM

## 2023-09-23 MED ORDER — EZETIMIBE 10 MG PO TABS: 10.0000 mg | ORAL_TABLET | Freq: Every day | ORAL | 11 refills | Status: DC

## 2023-09-23 MED ORDER — ATORVASTATIN CALCIUM 40 MG PO TABS: 40.0000 mg | ORAL_TABLET | Freq: Every day | ORAL | 3 refills | Status: DC

## 2023-09-23 NOTE — Progress Notes (Signed)
Patient name: Tyrone [name]. MRN: [id number] DOB: 1956-09-22 Sex: male  REASON FOR CONSULT: Triage, occluded right leg bypass  HPI: Tyrone [name]. is a 66 y.o. male, with history of COPD, hypertension, prostate cancer, tobacco abuse that presents for triage visit for occluded right leg bypass.  States he feels like he pulled a muscle in his right leg in the summer.  Has been having increasing pain in the right foot worsening over the last 2 to 3 months.  No tissue loss.  Was smoking 1 and half packs a day and states he is down to 5 cigarettes.  States he stopped his statin since it causes cancer.   He was initially evaluated for severe PAD.  He most recently underwent a right common femoral endarterectomy with bovine patch and a right common femoral to below-knee popliteal bypass with PTFE on 03/06/2022 for CLI with rest pain.  He also previously had a left leg bypass on 10/24/2021 with a left common femoral endarterectomy and bovine patch and a left common femoral to below knee popliteal artery bypass with PTFE.       Past Medical History:  Diagnosis Date   Arthritis    hands, knees, back   COPD (chronic obstructive pulmonary disease) (HCC)    Depression    Diskitis 02/28/2021   Hardware complicating wound infection (HCC) 02/28/2021   Headache    migraines   Hypertension    Legionella pneumonia (HCC) 02/28/2021   Neuromuscular disorder (HCC)    hands   Peripheral vascular disease (HCC)    Pre-diabetes    Prostate cancer (HCC)    Smoker 11/05/2021   Vaccine counseling 11/05/2021    Past Surgical History:  Procedure Laterality Date   ABDOMINAL AORTOGRAM W/LOWER EXTREMITY Bilateral 10/18/2021   Procedure: ABDOMINAL AORTOGRAM W/LOWER EXTREMITY;  Surgeon: Cephus Shelling, MD;  [facility]: [facility];  Service: Cardiovascular;  Laterality: Bilateral;   ABDOMINAL AORTOGRAM W/LOWER EXTREMITY Right 02/28/2022   Procedure: ABDOMINAL AORTOGRAM W/LOWER EXTREMITY;   Surgeon: Cephus Shelling, MD;  Location: [facility];  Service: Cardiovascular;  Laterality: Right;   APPENDECTOMY     BACK SURGERY  1980"s   L4 to L 5 laminectomy   COLONOSCOPY WITH PROPOFOL N/A 02/06/2016   Procedure: COLONOSCOPY WITH PROPOFOL;  Surgeon: Charolett Bumpers, MD;  Location: [facility];  Service: Endoscopy;  Laterality: N/A;   DIAGNOSTIC LAPAROSCOPY     ENDARTERECTOMY FEMORAL Left 10/24/2021   Procedure: ENDARTERECTOMY FEMORAL WITH PROFUNDAPLASTY;  Surgeon: Cephus Shelling, MD;  Location: [facility] OR;  Service: Vascular;  Laterality: Left;   ENDARTERECTOMY FEMORAL Right 03/06/2022   Procedure: RIGHT COMMON FEMORAL ENDARTERECTOMY;  Surgeon: Cephus Shelling, MD;  Location: [facility] OR;  Service: Vascular;  Laterality: Right;   FEMORAL-POPLITEAL BYPASS GRAFT Left 10/24/2021   Procedure: LEFT COMMON FEMORAL- BELOW KNEE POPLITEAL BYPASS;  Surgeon: Cephus Shelling, MD;  Location: [facility];  Service: Vascular;  Laterality: Left;   FEMORAL-POPLITEAL BYPASS GRAFT Right 03/06/2022   Procedure: RIGHT FEMORAL-POPLITEAL BYPASS WITH GORETEC GRAFT;  Surgeon: Cephus Shelling, MD;  Location: [facility];  Service: Vascular;  Laterality: Right;  INSERT ARTERIAL LINE   LAPAROSCOPIC APPENDECTOMY N/A 08/13/2018   Procedure: APPENDECTOMY LAPAROSCOPIC;  Surgeon: Ovidio Kin, MD;  Location: [facility];  Service: General;  Laterality: N/A;   PATCH ANGIOPLASTY Left 10/24/2021   Procedure: PATCH ANGIOPLASTY;  Surgeon: Cephus Shelling, MD;  Location: [facility] OR;  Service: Vascular;  Laterality: Left;  PELVIC LYMPH NODE DISSECTION Bilateral 02/08/2019   Procedure: PELVIC LYMPH NODE DISSECTION;  Surgeon: Crista Elliot, MD;  Location: [facility];  Service: Urology;  Laterality: Bilateral;   ROBOT ASSISTED LAPAROSCOPIC RADICAL PROSTATECTOMY N/A 02/08/2019   Procedure: XI ROBOTIC ASSISTED LAPAROSCOPIC RADICAL PROSTATECTOMY;  Surgeon: Crista Elliot, MD;  Location: [facility];  Service: Urology;  Laterality:  N/A;   TRANSFORAMINAL LUMBAR INTERBODY FUSION (TLIF) WITH PEDICLE SCREW FIXATION 1 LEVEL Left 09/12/2020   Procedure: LEFT-SIDED LUMBAR 1 - LUMBAR 2 TRANSFORAMINAL LUMBAR INTERBODY FUSION WITH INSTRUMENTATION AND ALLOGRAFT;  Surgeon: Estill Bamberg, MD;  Location: [facility] OR;  Service: Orthopedics;  Laterality: Left;  3C BED   VEIN HARVEST Left 10/24/2021   Procedure: VEIN HARVEST;  Surgeon: Cephus Shelling, MD;  Location: [facility] OR;  Service: Vascular;  Laterality: Left;   WRIST SURGERY Right    x 2     Family History  Problem Relation Age of Onset   Colon cancer Maternal Grandmother    Colon cancer Maternal Great-grandmother    Prostate cancer Neg Hx     SOCIAL HISTORY: Social History   Socioeconomic History   Marital status: Married    Spouse name: Not on file   Number of children: 6   Years of education: Not on file   Highest education level: Not on file  Occupational History   Occupation: Courtyard  Tobacco Use   Smoking status: Former    Current packs/day: 0.00    Types: Cigarettes    Quit date: 02/02/2022    Years since quitting: 1.6   Smokeless tobacco: Never   Tobacco comments:    Smoked 2 pkgs a day for 51 years  Vaping Use   Vaping status: Never Used  Substance and Sexual Activity   Alcohol use: Not Currently    Comment: occasional   Drug use: No   Sexual activity: Not Currently  Other Topics Concern   Not on file  Social History Narrative   Married for 46 years with 3 sons and 3 daughters. Smokes a ppd.    Social Determinants of Health   Financial Resource Strain: Not on file  Food Insecurity: No Food Insecurity (01/06/2023)   Hunger Vital Sign    Worried About Running Out of Food in the Last Year: Never true    Ran Out of Food in the Last Year: Never true  Transportation Needs: No Transportation Needs (01/06/2023)   PRAPARE - Administrator, Civil Service (Medical): No    Lack of Transportation (Non-Medical): No  Physical Activity: Not on  file  Stress: Not on file  Social Connections: Not on file  Intimate Partner Violence: Not At Risk (01/06/2023)   Humiliation, Afraid, Rape, and Kick questionnaire    Fear of Current or Ex-Partner: No    Emotionally Abused: No    Physically Abused: No    Sexually Abused: No    Allergies  Allergen Reactions   Neurontin [Gabapentin] Itching and Other (See Comments)    Confusion and sedation.   Celexa [Citalopram Hydrobromide]     restless leg/insomnia worse   Topamax [Topiramate]     numbness in hand    Current Outpatient Medications  Medication Sig Dispense Refill   aspirin 325 MG tablet Take 325 mg by mouth every evening.     buPROPion (WELLBUTRIN XL) 150 MG 24 hr tablet Take 150 mg by mouth daily.     ezetimibe (ZETIA) 10 MG tablet TAKE 1 TABLET BY MOUTH  DAILY 30 tablet 0   ipratropium-albuterol (DUONEB) 0.5-2.5 (3) MG/3ML SOLN Take 3 mLs by nebulization 2 (two) times daily. 360 mL 0   metFORMIN (GLUCOPHAGE) 500 MG tablet 1 tablet with a meal Orally Once a day for 90 days     Multiple Vitamin (MULTIVITAMIN WITH MINERALS) TABS tablet Take 1 tablet by mouth every evening. Centrum Silver     oxymetazoline (AFRIN) 0.05 % nasal spray Place 1 spray into both nostrils 2 (two) times daily as needed for congestion.     atorvastatin (LIPITOR) 40 MG tablet Take 1 tablet (40 mg total) by mouth daily. (Patient not taking: Reported on 01/06/2023) 90 tablet 3   guaiFENesin (MUCINEX) 600 MG 12 hr tablet Take 2 tablets (1,200 mg total) by mouth 2 (two) times daily.     morphine (MSIR) 15 MG tablet Take 15 mg by mouth every 6 (six) hours as needed for moderate pain (PAIN.).     nicotine (NICODERM CQ - DOSED IN MG/24 HOURS) 14 mg/24hr patch Place 14 mg onto the skin daily.     predniSONE (DELTASONE) 20 MG tablet Label  & dispense according to the schedule below. 5 Pills PO on day one then, 4 Pills PO on day two, 3 Pills PO on day three, 2Pills PO on day four, 1 Pill PO on day five,  then STOP.  Total  of 15 tabs 15 tablet 0   No current facility-administered medications for this visit.    REVIEW OF SYSTEMS:  [X]  denotes positive finding, [ ]  denotes negative finding Cardiac  Comments:  Chest pain or chest pressure:    Shortness of breath upon exertion:    Short of breath when lying flat:    Irregular heart rhythm:        Vascular    Pain in calf, thigh, or hip brought on by ambulation:    Pain in feet at night that wakes you up from your sleep:     Blood clot in your veins:    Leg swelling:         Pulmonary    Oxygen at home:    Productive cough:     Wheezing:         Neurologic    Sudden weakness in arms or legs:     Sudden numbness in arms or legs:     Sudden onset of difficulty speaking or slurred speech:    Temporary loss of vision in one eye:     Problems with dizziness:         Gastrointestinal    Blood in stool:     Vomited blood:         Genitourinary    Burning when urinating:     Blood in urine:        Psychiatric    Major depression:         Hematologic    Bleeding problems:    Problems with blood clotting too easily:        Skin    Rashes or ulcers:        Constitutional    Fever or chills:      PHYSICAL EXAM: Vitals:   09/23/23 0955  BP: 138/83  Pulse: 95  Temp: 98 F (36.7 C)  TempSrc: Temporal  SpO2: 94%  Weight: 231 lb 11.2 oz (105.1 kg)  Height: 5\' 10"  (1.778 m)    GENERAL: The patient is a well-nourished male, in no acute distress. The vital signs are documented  above. CARDIAC: There is a regular rate and rhythm.  VASCULAR:  Bilateral femoral pulses palpable Left DP palpable No palpable right pedal pulses  DATA:   ABIs today are 0.43 right monophasic and 0.88 left triphasic    Lower extremity arterial duplex shows patent left femoropopliteal bypass with occluded right femoropopliteal bypass   Assessment/Plan:  67 year old male presents for triage visit of occluded right leg bypass.    He underwent a right common  femoral endarterectomy with bovine patch and a right common femoral to below-knee popliteal bypass with PTFE on 03/06/2022 for CLI with rest pain.  He also previously had a left leg bypass on 10/24/2021 with a left common femoral endarterectomy and bovine patch and a left common femoral to below knee popliteal artery bypass with PTFE.    Discussed his right femoropopliteal bypass is occluded.  Sounds like this has been ongoing since the summertime.  I was hopeful that his femoral endarterectomy would prevent him from having recurrent CLI symptoms when his bypass occluded but unfortunately has had progressive symptoms over the last 2 to 3 months.  I have recommended aortogram with lower extremity arteriogram with a focus on the right leg.  Discussed options would be stenting his native arterial system versus stenting his occluded bypass versus a redo bypass.  I have offered to schedule this on Thursday but unfortunately he is no longer employed and does not have insurance until January 1.  We have discussed delaying his intervention until early January.  Risk benefits again discussed.  No tissue loss at this time.  I discussed him and his wife let me know if he has any progressive symptoms.  Cephus Shelling, MD [facility] Office: [phone]

## 2023-09-25 ENCOUNTER — Other Ambulatory Visit: Payer: Self-pay

## 2023-09-25 DIAGNOSIS — I70221 Atherosclerosis of native arteries of extremities with rest pain, right leg: Secondary | ICD-10-CM

## 2023-09-25 DIAGNOSIS — I70222 Atherosclerosis of native arteries of extremities with rest pain, left leg: Secondary | ICD-10-CM

## 2023-09-25 DIAGNOSIS — I739 Peripheral vascular disease, unspecified: Secondary | ICD-10-CM

## 2023-09-29 ENCOUNTER — Encounter (HOSPITAL_COMMUNITY): Payer: Self-pay

## 2023-09-29 ENCOUNTER — Ambulatory Visit (HOSPITAL_COMMUNITY): Admit: 2023-09-29 | Payer: Medicare Other | Admitting: Vascular Surgery

## 2023-09-29 SURGERY — ABDOMINAL AORTOGRAM W/LOWER EXTREMITY
Anesthesia: LOCAL

## 2023-10-14 ENCOUNTER — Ambulatory Visit: Payer: Medicare Other | Admitting: Vascular Surgery

## 2023-10-30 ENCOUNTER — Ambulatory Visit (HOSPITAL_COMMUNITY)
Admission: RE | Admit: 2023-10-30 | Discharge: 2023-10-30 | Disposition: A | Discharge status: Home / Self Care | Payer: HMO | Attending: Vascular Surgery | Admitting: Vascular Surgery

## 2023-10-30 ENCOUNTER — Ambulatory Visit (HOSPITAL_COMMUNITY): Payer: HMO

## 2023-10-30 ENCOUNTER — Encounter (HOSPITAL_COMMUNITY)
Admission: RE | Disposition: A | Discharge status: Home / Self Care | Payer: Self-pay | Source: Home / Self Care | Attending: Vascular Surgery

## 2023-10-30 ENCOUNTER — Encounter (HOSPITAL_COMMUNITY): Payer: Self-pay | Admitting: Vascular Surgery

## 2023-10-30 ENCOUNTER — Other Ambulatory Visit: Payer: Self-pay

## 2023-10-30 DIAGNOSIS — I739 Peripheral vascular disease, unspecified: Secondary | ICD-10-CM

## 2023-10-30 DIAGNOSIS — I771 Stricture of artery: Secondary | ICD-10-CM | POA: Insufficient documentation

## 2023-10-30 DIAGNOSIS — T82868A Thrombosis of vascular prosthetic devices, implants and grafts, initial encounter: Secondary | ICD-10-CM | POA: Diagnosis not present

## 2023-10-30 DIAGNOSIS — I70221 Atherosclerosis of native arteries of extremities with rest pain, right leg: Secondary | ICD-10-CM | POA: Diagnosis present

## 2023-10-30 DIAGNOSIS — Z87891 Personal history of nicotine dependence: Secondary | ICD-10-CM | POA: Insufficient documentation

## 2023-10-30 DIAGNOSIS — I70222 Atherosclerosis of native arteries of extremities with rest pain, left leg: Secondary | ICD-10-CM

## 2023-10-30 DIAGNOSIS — I7090 Unspecified atherosclerosis: Secondary | ICD-10-CM | POA: Diagnosis not present

## 2023-10-30 DIAGNOSIS — X58XXXA Exposure to other specified factors, initial encounter: Secondary | ICD-10-CM | POA: Insufficient documentation

## 2023-10-30 HISTORY — PX: ABDOMINAL AORTOGRAM W/LOWER EXTREMITY: CATH118223

## 2023-10-30 LAB — POCT I-STAT, CHEM 8
BUN: 23 mg/dL (ref 8–23)
Calcium, Ion: 1.05 mmol/L — ABNORMAL LOW (ref 1.15–1.40)
Chloride: 103 mmol/L (ref 98–111)
Creatinine, Ser: 1.1 mg/dL (ref 0.61–1.24)
Glucose, Bld: 124 mg/dL — ABNORMAL HIGH (ref 70–99)
HCT: 46 % (ref 39.0–52.0)
Hemoglobin: 15.6 g/dL (ref 13.0–17.0)
Potassium: 4.8 mmol/L (ref 3.5–5.1)
Sodium: 138 mmol/L (ref 135–145)
TCO2: 26 mmol/L (ref 22–32)

## 2023-10-30 LAB — GLUCOSE, CAPILLARY: Glucose-Capillary: 118 mg/dL — ABNORMAL HIGH (ref 70–99)

## 2023-10-30 SURGERY — ABDOMINAL AORTOGRAM W/LOWER EXTREMITY
Anesthesia: LOCAL | Laterality: Right

## 2023-10-30 MED ORDER — SODIUM CHLORIDE 0.9 % IV SOLN: INTRAVENOUS | Status: AC

## 2023-10-30 MED ORDER — MIDAZOLAM HCL 2 MG/2ML IJ SOLN
INTRAMUSCULAR | Status: AC
  Filled 2023-10-30: qty 2

## 2023-10-30 MED ORDER — FENTANYL CITRATE (PF) 100 MCG/2ML IJ SOLN
INTRAMUSCULAR | Status: AC
  Filled 2023-10-30: qty 2

## 2023-10-30 MED ORDER — LIDOCAINE HCL (PF) 1 % IJ SOLN
INTRAMUSCULAR | Status: AC
  Filled 2023-10-30: qty 30

## 2023-10-30 MED ORDER — HYDROCODONE-ACETAMINOPHEN 10-325 MG PO TABS
1.0000 | ORAL_TABLET | Freq: Once | ORAL | Status: AC
  Administered 2023-10-30: 1 via ORAL
  Filled 2023-10-30: qty 1

## 2023-10-30 MED ORDER — MIDAZOLAM HCL 2 MG/2ML IJ SOLN
INTRAMUSCULAR | Status: DC | PRN
  Administered 2023-10-30: 1 mg via INTRAVENOUS

## 2023-10-30 MED ORDER — SODIUM CHLORIDE 0.9% FLUSH: 3.0000 mL | Freq: Two times a day (BID) | INTRAVENOUS | Status: DC

## 2023-10-30 MED ORDER — HEPARIN (PORCINE) IN NACL 1000-0.9 UT/500ML-% IV SOLN
INTRAVENOUS | Status: DC | PRN
  Administered 2023-10-30 (×2): 500 mL

## 2023-10-30 MED ORDER — FENTANYL CITRATE (PF) 100 MCG/2ML IJ SOLN
INTRAMUSCULAR | Status: DC | PRN
  Administered 2023-10-30: 25 ug via INTRAVENOUS

## 2023-10-30 MED ORDER — ACETAMINOPHEN 325 MG PO TABS: 650.0000 mg | ORAL_TABLET | ORAL | Status: DC | PRN

## 2023-10-30 MED ORDER — SODIUM CHLORIDE 0.9 % IV SOLN: 250.0000 mL | INTRAVENOUS | Status: DC | PRN

## 2023-10-30 MED ORDER — LABETALOL HCL 5 MG/ML IV SOLN: 10.0000 mg | INTRAVENOUS | Status: DC | PRN

## 2023-10-30 MED ORDER — IODIXANOL 320 MG/ML IV SOLN
INTRAVENOUS | Status: DC | PRN
  Administered 2023-10-30: 50 mL

## 2023-10-30 MED ORDER — HYDRALAZINE HCL 20 MG/ML IJ SOLN: 5.0000 mg | INTRAMUSCULAR | Status: DC | PRN

## 2023-10-30 MED ORDER — SODIUM CHLORIDE 0.9% FLUSH: 3.0000 mL | INTRAVENOUS | Status: DC | PRN

## 2023-10-30 MED ORDER — LIDOCAINE HCL (PF) 1 % IJ SOLN
INTRAMUSCULAR | Status: DC | PRN
  Administered 2023-10-30: 10 mL

## 2023-10-30 MED ORDER — SODIUM CHLORIDE 0.9 % IV SOLN: INTRAVENOUS | Status: DC

## 2023-10-30 MED ORDER — ONDANSETRON HCL 4 MG/2ML IJ SOLN: 4.0000 mg | Freq: Four times a day (QID) | INTRAMUSCULAR | Status: DC | PRN

## 2023-10-30 SURGICAL SUPPLY — 13 items
CATH OMNI FLUSH 5F 65CM (CATHETERS) IMPLANT
CATH STRAIGHT 5FR 65CM (CATHETERS) IMPLANT
GLIDEWIRE ADV .035X260CM (WIRE) IMPLANT
KIT MICROPUNCTURE NIT STIFF (SHEATH) IMPLANT
KIT SINGLE USE MANIFOLD (KITS) IMPLANT
KIT SYRINGE INJ CVI SPIKEX1 (MISCELLANEOUS) IMPLANT
PROTECTION STATION PRESSURIZED (MISCELLANEOUS) ×1
SET ATX-X65L (MISCELLANEOUS) IMPLANT
SHEATH PINNACLE 5F 10CM (SHEATH) IMPLANT
SHEATH PROBE COVER 6X72 (BAG) IMPLANT
STATION PROTECTION PRESSURIZED (MISCELLANEOUS) IMPLANT
TRAY PV CATH (CUSTOM PROCEDURE TRAY) ×2 IMPLANT
WIRE BENTSON .035X145CM (WIRE) IMPLANT

## 2023-10-30 NOTE — Progress Notes (Signed)
Site area: L Groin (Arterial 7fr x 1) Site Prior to Removal:  Level 0 Pressure Applied For: 25 min Manual:   Yes Patient Status During Pull:  Stable Post Pull Site:  Level 0 Post Pull Instructions Given:  Yes Post Pull Pulses Present: DP L R Doppler Dressing Applied:  Gauze & Tegaderm Bedrest begins @ 0945 Comments: Pt resting, verbalized understanding of post pull instruction, denies pain in site, NAD.

## 2023-10-30 NOTE — Progress Notes (Signed)
Lower extremity venous duplex completed. Please see CV Procedures for preliminary results.  Shona Simpson, RVT 10/30/23 11:07 AM

## 2023-10-30 NOTE — H&P (Signed)
Patient name: Tyrone [name].    MRN: [id number]        DOB: Sep 20, 1956          Sex: male   REASON FOR CONSULT: Triage, occluded right leg bypass   HPI: Tyrone [name]. is a 68 y.o. male, with history of COPD, hypertension, prostate cancer, tobacco abuse that presents for triage visit for occluded right leg bypass.  States he feels like he pulled a muscle in his right leg in the summer.  Has been having increasing pain in the right foot worsening over the last 2 to 3 months.  No tissue loss.  Was smoking 1 and half packs a day and states he is down to 5 cigarettes.  States he stopped his statin since it causes cancer.     He was initially evaluated for severe PAD.  He most recently underwent a right common femoral endarterectomy with bovine patch and a right common femoral to below-knee popliteal bypass with PTFE on 03/06/2022 for CLI with rest pain.  He also previously had a left leg bypass on 10/24/2021 with a left common femoral endarterectomy and bovine patch and a left common femoral to below knee popliteal artery bypass with PTFE.               Past Medical History:  Diagnosis Date   Arthritis      hands, knees, back   COPD (chronic obstructive pulmonary disease) (HCC)     Depression     Diskitis 02/28/2021   Hardware complicating wound infection (HCC) 02/28/2021   Headache      migraines   Hypertension     Legionella pneumonia (HCC) 02/28/2021   Neuromuscular disorder (HCC)      hands   Peripheral vascular disease (HCC)     Pre-diabetes     Prostate cancer (HCC)     Smoker 11/05/2021   Vaccine counseling 11/05/2021               Past Surgical History:  Procedure Laterality Date   ABDOMINAL AORTOGRAM W/LOWER EXTREMITY Bilateral 10/18/2021    Procedure: ABDOMINAL AORTOGRAM W/LOWER EXTREMITY;  Surgeon: Cephus Shelling, MD;  [facility]: [facility];  Service: Cardiovascular;  Laterality: Bilateral;   ABDOMINAL AORTOGRAM W/LOWER EXTREMITY Right 02/28/2022     Procedure: ABDOMINAL AORTOGRAM W/LOWER EXTREMITY;  Surgeon: Cephus Shelling, MD;  Location: [facility];  Service: Cardiovascular;  Laterality: Right;   APPENDECTOMY       BACK SURGERY   1980"s    L4 to L 5 laminectomy   COLONOSCOPY WITH PROPOFOL N/A 02/06/2016    Procedure: COLONOSCOPY WITH PROPOFOL;  Surgeon: Charolett Bumpers, MD;  Location: [facility];  Service: Endoscopy;  Laterality: N/A;   DIAGNOSTIC LAPAROSCOPY       ENDARTERECTOMY FEMORAL Left 10/24/2021    Procedure: ENDARTERECTOMY FEMORAL WITH PROFUNDAPLASTY;  Surgeon: Cephus Shelling, MD;  Location: [facility] OR;  Service: Vascular;  Laterality: Left;   ENDARTERECTOMY FEMORAL Right 03/06/2022    Procedure: RIGHT COMMON FEMORAL ENDARTERECTOMY;  Surgeon: Cephus Shelling, MD;  Location: [facility] OR;  Service: Vascular;  Laterality: Right;   FEMORAL-POPLITEAL BYPASS GRAFT Left 10/24/2021    Procedure: LEFT COMMON FEMORAL- BELOW KNEE POPLITEAL BYPASS;  Surgeon: Cephus Shelling, MD;  Location: [facility] OR;  Service: Vascular;  Laterality: Left;   FEMORAL-POPLITEAL BYPASS GRAFT Right 03/06/2022    Procedure: RIGHT FEMORAL-POPLITEAL BYPASS WITH GORETEC GRAFT;  Surgeon: Cephus Shelling, MD;  Location: [facility];  Service: Vascular;  Laterality: Right;  INSERT ARTERIAL LINE   LAPAROSCOPIC APPENDECTOMY N/A 08/13/2018    Procedure: APPENDECTOMY LAPAROSCOPIC;  Surgeon: Ovidio Kin, MD;  Location: [facility];  Service: General;  Laterality: N/A;   PATCH ANGIOPLASTY Left 10/24/2021    Procedure: PATCH ANGIOPLASTY;  Surgeon: Cephus Shelling, MD;  Location: [facility] OR;  Service: Vascular;  Laterality: Left;   PELVIC LYMPH NODE DISSECTION Bilateral 02/08/2019    Procedure: PELVIC LYMPH NODE DISSECTION;  Surgeon: Crista Elliot, MD;  Location: [facility];  Service: Urology;  Laterality: Bilateral;   ROBOT ASSISTED LAPAROSCOPIC RADICAL PROSTATECTOMY N/A 02/08/2019    Procedure: XI ROBOTIC ASSISTED LAPAROSCOPIC RADICAL PROSTATECTOMY;  Surgeon: Crista Elliot, MD;  Location: [facility];  Service: Urology;  Laterality: N/A;   TRANSFORAMINAL LUMBAR INTERBODY FUSION (TLIF) WITH PEDICLE SCREW FIXATION 1 LEVEL Left 09/12/2020    Procedure: LEFT-SIDED LUMBAR 1 - LUMBAR 2 TRANSFORAMINAL LUMBAR INTERBODY FUSION WITH INSTRUMENTATION AND ALLOGRAFT;  Surgeon: Estill Bamberg, MD;  Location: [facility] OR;  Service: Orthopedics;  Laterality: Left;  3C BED   VEIN HARVEST Left 10/24/2021    Procedure: VEIN HARVEST;  Surgeon: Cephus Shelling, MD;  Location: [facility] OR;  Service: Vascular;  Laterality: Left;   WRIST SURGERY Right      x 2               Family History  Problem Relation Age of Onset   Colon cancer Maternal Grandmother     Colon cancer Maternal Great-grandmother     Prostate cancer Neg Hx            SOCIAL HISTORY: Social History         Socioeconomic History   Marital status: Married      Spouse name: Not on file   Number of children: 6   Years of education: Not on file   Highest education level: Not on file  Occupational History   Occupation: Courtyard  Tobacco Use   Smoking status: Former      Current packs/day: 0.00      Types: Cigarettes      Quit date: 02/02/2022      Years since quitting: 1.6   Smokeless tobacco: Never   Tobacco comments:      Smoked 2 pkgs a day for 51 years  Vaping Use   Vaping status: Never Used  Substance and Sexual Activity   Alcohol use: Not Currently      Comment: occasional   Drug use: No   Sexual activity: Not Currently  Other Topics Concern   Not on file  Social History Narrative    Married for 46 years with 3 sons and 3 daughters. Smokes a ppd.     Social Determinants of Health        Financial Resource Strain: Not on file  Food Insecurity: No Food Insecurity (01/06/2023)    Hunger Vital Sign     Worried About Running Out of Food in the Last Year: Never true     Ran Out of Food in the Last Year: Never true  Transportation Needs: No Transportation Needs (01/06/2023)    PRAPARE -  Therapist, art (Medical): No     Lack of Transportation (Non-Medical): No  Physical Activity: Not on file  Stress: Not on file  Social Connections: Not on file  Intimate Partner Violence: Not At Risk (01/06/2023)    Humiliation, Afraid, Rape, and Kick  questionnaire     Fear of Current or Ex-Partner: No     Emotionally Abused: No     Physically Abused: No     Sexually Abused: No      Allergies       Allergies  Allergen Reactions   Neurontin [Gabapentin] Itching and Other (See Comments)      Confusion and sedation.   Celexa [Citalopram Hydrobromide]        restless leg/insomnia worse   Topamax [Topiramate]        numbness in hand              Current Outpatient Medications  Medication Sig Dispense Refill   aspirin 325 MG tablet Take 325 mg by mouth every evening.       buPROPion (WELLBUTRIN XL) 150 MG 24 hr tablet Take 150 mg by mouth daily.       ezetimibe (ZETIA) 10 MG tablet TAKE 1 TABLET BY MOUTH DAILY 30 tablet 0   ipratropium-albuterol (DUONEB) 0.5-2.5 (3) MG/3ML SOLN Take 3 mLs by nebulization 2 (two) times daily. 360 mL 0   metFORMIN (GLUCOPHAGE) 500 MG tablet 1 tablet with a meal Orally Once a day for 90 days       Multiple Vitamin (MULTIVITAMIN WITH MINERALS) TABS tablet Take 1 tablet by mouth every evening. Centrum Silver       oxymetazoline (AFRIN) 0.05 % nasal spray Place 1 spray into both nostrils 2 (two) times daily as needed for congestion.       atorvastatin (LIPITOR) 40 MG tablet Take 1 tablet (40 mg total) by mouth daily. (Patient not taking: Reported on 01/06/2023) 90 tablet 3   guaiFENesin (MUCINEX) 600 MG 12 hr tablet Take 2 tablets (1,200 mg total) by mouth 2 (two) times daily.       morphine (MSIR) 15 MG tablet Take 15 mg by mouth every 6 (six) hours as needed for moderate pain (PAIN.).       nicotine (NICODERM CQ - DOSED IN MG/24 HOURS) 14 mg/24hr patch Place 14 mg onto the skin daily.       predniSONE (DELTASONE) 20 MG tablet  Label  & dispense according to the schedule below. 5 Pills PO on day one then, 4 Pills PO on day two, 3 Pills PO on day three, 2Pills PO on day four, 1 Pill PO on day five,  then STOP.  Total of 15 tabs 15 tablet 0      No current facility-administered medications for this visit.        REVIEW OF SYSTEMS:  [X]  denotes positive finding, [ ]  denotes negative finding Cardiac   Comments:  Chest pain or chest pressure:      Shortness of breath upon exertion:      Short of breath when lying flat:      Irregular heart rhythm:             Vascular      Pain in calf, thigh, or hip brought on by ambulation:      Pain in feet at night that wakes you up from your sleep:       Blood clot in your veins:      Leg swelling:              Pulmonary      Oxygen at home:      Productive cough:       Wheezing:  Neurologic      Sudden weakness in arms or legs:       Sudden numbness in arms or legs:       Sudden onset of difficulty speaking or slurred speech:      Temporary loss of vision in one eye:       Problems with dizziness:              Gastrointestinal      Blood in stool:       Vomited blood:              Genitourinary      Burning when urinating:       Blood in urine:             Psychiatric      Major depression:              Hematologic      Bleeding problems:      Problems with blood clotting too easily:             Skin      Rashes or ulcers:             Constitutional      Fever or chills:          PHYSICAL EXAM:    Vitals:    09/23/23 0955  BP: 138/83  Pulse: 95  Temp: 98 F (36.7 C)  TempSrc: Temporal  SpO2: 94%  Weight: 231 lb 11.2 oz (105.1 kg)  Height: 5\' 10"  (1.778 m)      GENERAL: The patient is a well-nourished male, in no acute distress. The vital signs are documented above. CARDIAC: There is a regular rate and rhythm.  VASCULAR:  Bilateral femoral pulses palpable Left DP palpable No palpable right pedal pulses   DATA:     ABIs today are 0.43 right monophasic and 0.88 left triphasic     Lower extremity arterial duplex shows patent left femoropopliteal bypass with occluded right femoropopliteal bypass     Assessment/Plan:   68 year old male presents for triage visit of occluded right leg bypass.     He underwent a right common femoral endarterectomy with bovine patch and a right common femoral to below-knee popliteal bypass with PTFE on 03/06/2022 for CLI with rest pain.  He also previously had a left leg bypass on 10/24/2021 with a left common femoral endarterectomy and bovine patch and a left common femoral to below knee popliteal artery bypass with PTFE.     Discussed his right femoropopliteal bypass is occluded.  Sounds like this has been ongoing since the summertime.  I was hopeful that his femoral endarterectomy would prevent him from having recurrent CLI symptoms when his bypass occluded but unfortunately has had progressive symptoms over the last 2 to 3 months.  I have recommended aortogram with lower extremity arteriogram with a focus on the right leg.  Discussed options would be stenting his native arterial system versus stenting his occluded bypass versus a redo bypass.   Risk benefits again discussed.  No tissue loss at this time.    Cephus Shelling, MD [facility] Office: [phone]

## 2023-10-30 NOTE — Op Note (Signed)
Patient name: Tyrone [name]. MRN: [id number] DOB: [birth year] Sex: male  10/30/2023 Pre-operative Diagnosis: Critical limb ischemia with rest pain in the right lower extremity with occluded right common femoral to below-knee popliteal PTFE bypass Post-operative diagnosis:  Same Surgeon:  Cephus Shelling, MD Procedure Performed: 1.  Ultrasound-guided access left common femoral artery 2.  Aortogram with catheter selection of aorta 3.  Right iliac angiogram with pullback pressure 4.  Right lower extremity arteriogram with catheter selection of the right common femoral artery 5.  32 minutes of monitored moderate conscious sedation time  Indications: Patient is a 68 year old male previously underwent a right common femoral endarterectomy with right common femoral to below-knee popliteal bypass with PTFE on 03/06/2022 for CLI with rest pain.  Seen in the office with occluded bypass with symptoms over the last 6 months.  He presents for lower extremity angiogram possible intervention after risks benefits discussed.  Findings:   Ultrasound-guided access left common femoral artery.  Catheter selection of aorta with aortogram showed no flow-limiting stenosis in the aorta with a patent infrarenal aorta.  On the right the common iliac and external iliac were patent.  There did appear to be a right iliac lesion but we did a pullback pressure with no gradient and on further imaging this appeared patent without flow-limiting stenosis.    Right common femoral endarterectomy widely patent with patent profunda branches.  Occluded right femoral to below-knee popliteal bypass.  Distally reconstitutes the popliteal artery at the below-knee segment and it looks diseased.  Dominant runoff is in the anterior tibial into the foot.  Peroneal is patent but diseased.  Proximal anterior tibial appears to have some disease.   Procedure:  The patient was identified in the holding area and taken to room 8.  The  patient was then placed supine on the table and prepped and draped in the usual sterile fashion.  A time out was called.  Patient received Versed and fentanyl for conscious moderate sedation.  Vital signs were monitored including heart rate, respiratory rate, oxygenation and blood pressure.  I was present for all of moderate sedation.  Ultrasound was used to evaluate the left common femoral artery.  It was patent .  A digital ultrasound image was acquired.  A micropuncture needle was used to access the left common femoral artery under ultrasound guidance.  An 018 wire was advanced without resistance and a micropuncture sheath was placed.  The 018 wire was removed and a benson wire was placed.  The micropuncture sheath was exchanged for a 5 french sheath.  An omniflush catheter was advanced over the wire to the level of L-1.  An abdominal angiogram was obtained.  Next, using the omniflush catheter and a benson wire, the aortic bifurcation was crossed and the catheter was placed into theright external iliac artery.  We got better imaging of the right iliac artery to ensure there was no flow-limiting stenosis.  We then got a right lower extremity runoff.  I did use a straight catheter in the right common femoral artery and this was pulled back through the right iliac artery showing no pressure gradient.  Pertinent findings are noted above.  I did not feel he had an endovascular options.  Wires and catheters were removed.   Plan: Patient will get updated vein mapping.  Will need redo right lower extremity bypass to either the below-knee popliteal artery or AT.   Cephus Shelling, MD [facility] Office:  [phone]   

## 2023-11-19 NOTE — Progress Notes (Signed)
 Surgical Instructions   Your procedure is scheduled on Wednesday, November 26, 2023. Report to [facility] Main Entrance A at 9:00 A.M., then check in with the Admitting office. Any questions or running late day of surgery: call [phone]  Questions prior to your surgery date: call [phone], Monday-Friday, 8am-4pm. If you experience any cold or flu symptoms such as cough, fever, chills, shortness of breath, etc. between now and your scheduled surgery, please notify us  at the above number.     Remember:  Do not eat or drink after midnight the night before your surgery   Take these medicines the morning of surgery with A SIP OF WATER   atorvastatin  (LIPITOR )  buPROPion  (WELLBUTRIN  XL)    May take these medicines IF NEEDED: albuterol  (VENTOLIN  HFA) - please bring inhaler with you the day of your surgery.  ipratropium-albuterol  (DUONEB) nebulizer   Follow your surgeon's instructions on when to stop Asprin.  If no instructions were given by your surgeon then you will need to call the office to get those instructions.     One week prior to surgery, STOP taking any Aleve, Naproxen, Ibuprofen, Motrin, Advil, Goody's, BC's, all herbal medications, fish oil, and non-prescription vitamins.                     Do NOT Smoke (Tobacco/Vaping) for 24 hours prior to your procedure.  If you use a CPAP at night, you may bring your mask/headgear for your overnight stay.   You will be asked to remove any contacts, glasses, piercing's, hearing aid's, dentures/partials prior to surgery. Please bring cases for these items if needed.    Patients discharged the day of surgery will not be allowed to drive home, and someone needs to stay with them for 24 hours.  SURGICAL WAITING ROOM VISITATION Patients may have no more than 2 support people in the waiting area - these visitors may rotate.   Pre-op nurse will coordinate an appropriate time for 1 ADULT support person, who may not rotate, to  accompany patient in pre-op.  Children under the age of 41 must have an adult with them who is not the patient and must remain in the main waiting area with an adult.  If the patient needs to stay at the hospital during part of their recovery, the visitor guidelines for inpatient rooms apply.  Please refer to the [facility] website for the visitor guidelines for any additional information.   If you received a COVID test during your pre-op visit  it is requested that you wear a mask when out in public, stay away from anyone that may not be feeling well and notify your surgeon if you develop symptoms. If you have been in contact with anyone that has tested positive in the last 10 days please notify you surgeon.      Pre-operative CHG Bathing Instructions   You can play a key role in reducing the risk of infection after surgery. Your skin needs to be as free of germs as possible. You can reduce the number of germs on your skin by washing with CHG (chlorhexidine  gluconate) soap before surgery. CHG is an antiseptic soap that kills germs and continues to kill germs even after washing.   DO NOT use if you have an allergy to chlorhexidine /CHG or antibacterial soaps. If your skin becomes reddened or irritated, stop using the CHG and notify one of our RNs at [phone].  TAKE A SHOWER THE NIGHT BEFORE SURGERY AND THE DAY OF SURGERY    Please keep in mind the following:  DO NOT shave, including legs and underarms, 48 hours prior to surgery.   You may shave your face before/day of surgery.  Place clean sheets on your bed the night before surgery Use a clean washcloth (not used since being washed) for each shower. DO NOT sleep with pet's night before surgery.  CHG Shower Instructions:  Wash your face and private area with normal soap. If you choose to wash your hair, wash first with your normal shampoo.  After you use shampoo/soap, rinse your hair and body thoroughly to remove  shampoo/soap residue.  Turn the water  OFF and apply half the bottle of CHG soap to a CLEAN washcloth.  Apply CHG soap ONLY FROM YOUR NECK DOWN TO YOUR TOES (washing for 3-5 minutes)  DO NOT use CHG soap on face, private areas, open wounds, or sores.  Pay special attention to the area where your surgery is being performed.  If you are having back surgery, having someone wash your back for you may be helpful. Wait 2 minutes after CHG soap is applied, then you may rinse off the CHG soap.  Pat dry with a clean towel  Put on clean pajamas    Additional instructions for the day of surgery: DO NOT APPLY any lotions, deodorants, cologne, or perfumes.   Do not wear jewelry or makeup Do not wear nail polish, gel polish, artificial nails, or any other type of covering on natural nails (fingers and toes) Do not bring valuables to the hospital. [facility] is not responsible for valuables/personal belongings. Put on clean/comfortable clothes.  Please brush your teeth.  Ask your nurse before applying any prescription medications to the skin.

## 2023-11-20 ENCOUNTER — Encounter (HOSPITAL_COMMUNITY): Payer: Self-pay

## 2023-11-20 ENCOUNTER — Other Ambulatory Visit: Payer: Self-pay

## 2023-11-20 ENCOUNTER — Encounter (HOSPITAL_COMMUNITY)
Admission: RE | Admit: 2023-11-20 | Discharge: 2023-11-20 | Disposition: A | Discharge status: Home / Self Care | Payer: HMO | Source: Ambulatory Visit | Attending: Vascular Surgery | Admitting: Vascular Surgery

## 2023-11-20 VITALS — BP 145/77 | HR 93 | Temp 98.4°F | Resp 18 | Ht 70.0 in | Wt 232.3 lb

## 2023-11-20 DIAGNOSIS — Z01818 Encounter for other preprocedural examination: Secondary | ICD-10-CM

## 2023-11-20 DIAGNOSIS — Z01812 Encounter for preprocedural laboratory examination: Secondary | ICD-10-CM | POA: Diagnosis present

## 2023-11-20 DIAGNOSIS — I70221 Atherosclerosis of native arteries of extremities with rest pain, right leg: Secondary | ICD-10-CM | POA: Insufficient documentation

## 2023-11-20 LAB — URINALYSIS, ROUTINE W REFLEX MICROSCOPIC
Bacteria, UA: NONE SEEN
Bilirubin Urine: NEGATIVE
Glucose, UA: NEGATIVE mg/dL
Ketones, ur: NEGATIVE mg/dL
Leukocytes,Ua: NEGATIVE
Nitrite: NEGATIVE
Protein, ur: NEGATIVE mg/dL
Specific Gravity, Urine: 1.015 (ref 1.005–1.030)
pH: 5 (ref 5.0–8.0)

## 2023-11-20 LAB — COMPREHENSIVE METABOLIC PANEL
ALT: 44 U/L (ref 0–44)
AST: 29 U/L (ref 15–41)
Albumin: 3.9 g/dL (ref 3.5–5.0)
Alkaline Phosphatase: 108 U/L (ref 38–126)
Anion gap: 10 (ref 5–15)
BUN: 16 mg/dL (ref 8–23)
CO2: 28 mmol/L (ref 22–32)
Calcium: 9.3 mg/dL (ref 8.9–10.3)
Chloride: 99 mmol/L (ref 98–111)
Creatinine, Ser: 1.19 mg/dL (ref 0.61–1.24)
GFR, Estimated: >60 mL/min (ref >60)
Glucose, Bld: 107 mg/dL — ABNORMAL HIGH (ref 70–99)
Potassium: 4.4 mmol/L (ref 3.5–5.1)
Sodium: 137 mmol/L (ref 135–145)
Total Bilirubin: 0.6 mg/dL (ref 0.0–1.2)
Total Protein: 7.2 g/dL (ref 6.5–8.1)

## 2023-11-20 LAB — CBC
HCT: 46.8 % (ref 39.0–52.0)
Hemoglobin: 16.2 g/dL (ref 13.0–17.0)
MCH: 30.9 pg (ref 26.0–34.0)
MCHC: 34.6 g/dL (ref 30.0–36.0)
MCV: 89.3 fL (ref 80.0–100.0)
Platelets: 259 10*3/uL (ref 150–400)
RBC: 5.24 MIL/uL (ref 4.22–5.81)
RDW: 12.1 % (ref 11.5–15.5)
WBC: 8.6 10*3/uL (ref 4.0–10.5)
nRBC: 0 % (ref 0.0–0.2)

## 2023-11-20 LAB — PROTIME-INR
INR: 1 (ref 0.8–1.2)
Prothrombin Time: 13.3 s (ref 11.4–15.2)

## 2023-11-20 LAB — SURGICAL PCR SCREEN
MRSA, PCR: NEGATIVE
Staphylococcus aureus: NEGATIVE

## 2023-11-20 LAB — APTT: aPTT: 27 s (ref 24–36)

## 2023-11-20 NOTE — Progress Notes (Signed)
 PCP - Dr. Lunette Cardiologist - denies  Chest x-ray -  EKG - 12-17-22 Stress Test - denies ECHO - denies Cardiac Cath - denies  Sleep Study - states he had one years ago--was negative CPAP -   Fasting Blood Sugar - prediabetes, does not check blood sugar Checks Blood Sugar _____ times a day  Blood Thinner Instructions: Aspirin  Instructions: Continue ASA  ERAS Protcol - NPO PRE-SURGERY Ensure or G2-   COVID TEST- n/a   Patient denies shortness of breath, fever, cough and chest pain at PAT appointment

## 2023-11-20 NOTE — Progress Notes (Signed)
 Pt positive for antibodies, per blood bank. T&S needs to be redrawn day of surgery. New order placed

## 2023-11-20 NOTE — Progress Notes (Signed)
 Surgical Instructions   Your procedure is scheduled on Wednesday, November 26, 2023. Report to [facility] Main Entrance A at 8:55 A.M., then check in with the Admitting office. Any questions or running late day of surgery: call [phone]  Questions prior to your surgery date: call [phone], Monday-Friday, 8am-4pm. If you experience any cold or flu symptoms such as cough, fever, chills, shortness of breath, etc. between now and your scheduled surgery, please notify us  at the above number.     Remember:  Do not eat or drink after midnight the night before your surgery   Take these medicines the morning of surgery with A SIP OF WATER   atorvastatin  (LIPITOR )  buPROPion  (WELLBUTRIN  XL)  Ezetimibe  (ZETIA ) Aspirin   May take these medicines IF NEEDED: albuterol  (VENTOLIN  HFA) - please bring inhaler with you the day of your surgery.  ipratropium-albuterol  (DUONEB) nebulizer  oxymetazoline  (AFRIN) nasal spray  One week prior to surgery, STOP taking any Aleve, Naproxen, Ibuprofen, Motrin, Advil, Goody's, BC's, all herbal medications, fish oil, and non-prescription vitamins.                     Do NOT Smoke (Tobacco/Vaping) for 24 hours prior to your procedure.  If you use a CPAP at night, you may bring your mask/headgear for your overnight stay.   You will be asked to remove any contacts, glasses, piercing's, hearing aid's, dentures/partials prior to surgery. Please bring cases for these items if needed.    Patients discharged the day of surgery will not be allowed to drive home, and someone needs to stay with them for 24 hours.  SURGICAL WAITING ROOM VISITATION Patients may have no more than 2 support people in the waiting area - these visitors may rotate.   Pre-op nurse will coordinate an appropriate time for 1 ADULT support person, who may not rotate, to accompany patient in pre-op.  Children under the age of 40 must have an adult with them who is not the patient and must  remain in the main waiting area with an adult.  If the patient needs to stay at the hospital during part of their recovery, the visitor guidelines for inpatient rooms apply.  Please refer to the [facility] website for the visitor guidelines for any additional information.   If you received a COVID test during your pre-op visit  it is requested that you wear a mask when out in public, stay away from anyone that may not be feeling well and notify your surgeon if you develop symptoms. If you have been in contact with anyone that has tested positive in the last 10 days please notify you surgeon.      Pre-operative CHG Bathing Instructions   You can play a key role in reducing the risk of infection after surgery. Your skin needs to be as free of germs as possible. You can reduce the number of germs on your skin by washing with CHG (chlorhexidine  gluconate) soap before surgery. CHG is an antiseptic soap that kills germs and continues to kill germs even after washing.   DO NOT use if you have an allergy to chlorhexidine /CHG or antibacterial soaps. If your skin becomes reddened or irritated, stop using the CHG and notify one of our RNs at [phone].              TAKE A SHOWER THE NIGHT BEFORE SURGERY AND THE DAY OF SURGERY    Please keep in mind the following:  DO NOT shave, including  legs and underarms, 48 hours prior to surgery.   You may shave your face before/day of surgery.  Place clean sheets on your bed the night before surgery Use a clean washcloth (not used since being washed) for each shower. DO NOT sleep with pet's night before surgery.  CHG Shower Instructions:  Wash your face and private area with normal soap. If you choose to wash your hair, wash first with your normal shampoo.  After you use shampoo/soap, rinse your hair and body thoroughly to remove shampoo/soap residue.  Turn the water  OFF and apply half the bottle of CHG soap to a CLEAN washcloth.  Apply CHG soap ONLY  FROM YOUR NECK DOWN TO YOUR TOES (washing for 3-5 minutes)  DO NOT use CHG soap on face, private areas, open wounds, or sores.  Pay special attention to the area where your surgery is being performed.  If you are having back surgery, having someone wash your back for you may be helpful. Wait 2 minutes after CHG soap is applied, then you may rinse off the CHG soap.  Pat dry with a clean towel  Put on clean pajamas    Additional instructions for the day of surgery: DO NOT APPLY any lotions, deodorants, cologne, or perfumes.   Do not wear jewelry or makeup Do not wear nail polish, gel polish, artificial nails, or any other type of covering on natural nails (fingers and toes) Do not bring valuables to the hospital. [facility] is not responsible for valuables/personal belongings. Put on clean/comfortable clothes.  Please brush your teeth.  Ask your nurse before applying any prescription medications to the skin.

## 2023-11-21 ENCOUNTER — Telehealth: Payer: Self-pay

## 2023-11-21 NOTE — Telephone Encounter (Signed)
 Returned call from [place] in blood bank regarding how many units of blood for pt's sugery next week. I spoke to [place] to let her know 2 units.

## 2023-11-26 ENCOUNTER — Inpatient Hospital Stay (HOSPITAL_COMMUNITY): Payer: HMO | Admitting: Certified Registered Nurse Anesthetist

## 2023-11-26 ENCOUNTER — Other Ambulatory Visit: Payer: Self-pay

## 2023-11-26 ENCOUNTER — Inpatient Hospital Stay (HOSPITAL_COMMUNITY)
Admission: RE | Admit: 2023-11-26 | Discharge: 2023-12-02 | DRG: 254 | Disposition: A | Discharge status: Home / Self Care | Payer: HMO | Attending: Vascular Surgery | Admitting: Vascular Surgery

## 2023-11-26 ENCOUNTER — Encounter (HOSPITAL_COMMUNITY)
Admission: RE | Disposition: A | Discharge status: Home / Self Care | Payer: Self-pay | Source: Home / Self Care | Attending: Vascular Surgery

## 2023-11-26 ENCOUNTER — Encounter (HOSPITAL_COMMUNITY): Payer: Self-pay | Admitting: Vascular Surgery

## 2023-11-26 DIAGNOSIS — F17210 Nicotine dependence, cigarettes, uncomplicated: Secondary | ICD-10-CM | POA: Diagnosis present

## 2023-11-26 DIAGNOSIS — Z7982 Long term (current) use of aspirin: Secondary | ICD-10-CM | POA: Diagnosis not present

## 2023-11-26 DIAGNOSIS — I7789 Other specified disorders of arteries and arterioles: Secondary | ICD-10-CM | POA: Diagnosis present

## 2023-11-26 DIAGNOSIS — Y828 Other medical devices associated with adverse incidents: Secondary | ICD-10-CM | POA: Diagnosis present

## 2023-11-26 DIAGNOSIS — Z79899 Other long term (current) drug therapy: Secondary | ICD-10-CM

## 2023-11-26 DIAGNOSIS — Z87891 Personal history of nicotine dependence: Secondary | ICD-10-CM | POA: Diagnosis not present

## 2023-11-26 DIAGNOSIS — E669 Obesity, unspecified: Secondary | ICD-10-CM | POA: Diagnosis present

## 2023-11-26 DIAGNOSIS — I70221 Atherosclerosis of native arteries of extremities with rest pain, right leg: Secondary | ICD-10-CM | POA: Diagnosis present

## 2023-11-26 DIAGNOSIS — Z7984 Long term (current) use of oral hypoglycemic drugs: Secondary | ICD-10-CM | POA: Diagnosis not present

## 2023-11-26 DIAGNOSIS — Z800 Family history of malignant neoplasm of digestive organs: Secondary | ICD-10-CM

## 2023-11-26 DIAGNOSIS — M159 Polyosteoarthritis, unspecified: Secondary | ICD-10-CM | POA: Diagnosis present

## 2023-11-26 DIAGNOSIS — R7303 Prediabetes: Secondary | ICD-10-CM | POA: Diagnosis present

## 2023-11-26 DIAGNOSIS — T82898A Other specified complication of vascular prosthetic devices, implants and grafts, initial encounter: Secondary | ICD-10-CM | POA: Diagnosis present

## 2023-11-26 DIAGNOSIS — Z91128 Patient's intentional underdosing of medication regimen for other reason: Secondary | ICD-10-CM | POA: Diagnosis not present

## 2023-11-26 DIAGNOSIS — Z8546 Personal history of malignant neoplasm of prostate: Secondary | ICD-10-CM

## 2023-11-26 DIAGNOSIS — G43909 Migraine, unspecified, not intractable, without status migrainosus: Secondary | ICD-10-CM | POA: Diagnosis present

## 2023-11-26 DIAGNOSIS — J449 Chronic obstructive pulmonary disease, unspecified: Secondary | ICD-10-CM

## 2023-11-26 DIAGNOSIS — E66811 Obesity, class 1: Secondary | ICD-10-CM | POA: Diagnosis present

## 2023-11-26 DIAGNOSIS — I10 Essential (primary) hypertension: Secondary | ICD-10-CM | POA: Diagnosis present

## 2023-11-26 DIAGNOSIS — F32A Depression, unspecified: Secondary | ICD-10-CM | POA: Diagnosis present

## 2023-11-26 DIAGNOSIS — Z888 Allergy status to other drugs, medicaments and biological substances status: Secondary | ICD-10-CM | POA: Diagnosis not present

## 2023-11-26 DIAGNOSIS — Z01818 Encounter for other preprocedural examination: Secondary | ICD-10-CM

## 2023-11-26 HISTORY — PX: GROIN EXPOSURE: SHX7357

## 2023-11-26 HISTORY — PX: BYPASS GRAFT POPLITEAL TO TIBIAL: SHX5764

## 2023-11-26 HISTORY — PX: VEIN HARVEST: SHX6363

## 2023-11-26 LAB — TYPE AND SCREEN
ABO/RH(D): O POS
Antibody Screen: POSITIVE

## 2023-11-26 SURGERY — CREATION, BYPASS, ARTERIAL, POPLITEAL TO TIBIAL, USING GRAFT
Anesthesia: General | Site: Leg Lower | Laterality: Right

## 2023-11-26 MED ORDER — PROTAMINE SULFATE 10 MG/ML IV SOLN
INTRAVENOUS | Status: AC
  Filled 2023-11-26: qty 25

## 2023-11-26 MED ORDER — CEFAZOLIN SODIUM-DEXTROSE 2-4 GM/100ML-% IV SOLN
2.0000 g | Freq: Three times a day (TID) | INTRAVENOUS | Status: AC
  Administered 2023-11-26 – 2023-11-27 (×2): 2 g via INTRAVENOUS
  Filled 2023-11-26 (×2): qty 100

## 2023-11-26 MED ORDER — MIDAZOLAM HCL 2 MG/2ML IJ SOLN
INTRAMUSCULAR | Status: DC | PRN
  Administered 2023-11-26: 2 mg via INTRAVENOUS

## 2023-11-26 MED ORDER — MAGNESIUM SULFATE 2 GM/50ML IV SOLN: 2.0000 g | Freq: Every day | INTRAVENOUS | Status: DC | PRN

## 2023-11-26 MED ORDER — MIDAZOLAM HCL 2 MG/2ML IJ SOLN
INTRAMUSCULAR | Status: AC
  Filled 2023-11-26: qty 2

## 2023-11-26 MED ORDER — OXYCODONE HCL 5 MG PO TABS
5.0000 mg | ORAL_TABLET | ORAL | Status: DC | PRN
Start: 2023-11-26 — End: 2023-11-30
  Administered 2023-11-26 – 2023-11-30 (×17): 10 mg via ORAL
  Filled 2023-11-26 (×18): qty 2

## 2023-11-26 MED ORDER — HYDROMORPHONE HCL 1 MG/ML IJ SOLN
INTRAMUSCULAR | Status: DC | PRN
  Administered 2023-11-26: .5 mg via INTRAVENOUS

## 2023-11-26 MED ORDER — HYDROMORPHONE HCL 1 MG/ML IJ SOLN
INTRAMUSCULAR | Status: AC
  Filled 2023-11-26: qty 1

## 2023-11-26 MED ORDER — SUGAMMADEX SODIUM 200 MG/2ML IV SOLN
INTRAVENOUS | Status: DC | PRN
  Administered 2023-11-26: 200 mg via INTRAVENOUS

## 2023-11-26 MED ORDER — ASPIRIN 325 MG PO TABS
325.0000 mg | ORAL_TABLET | Freq: Every evening | ORAL | Status: DC
  Administered 2023-11-27 – 2023-12-01 (×5): 325 mg via ORAL
  Filled 2023-11-26 (×5): qty 1

## 2023-11-26 MED ORDER — PROPOFOL 10 MG/ML IV BOLUS
INTRAVENOUS | Status: AC
  Filled 2023-11-26: qty 20

## 2023-11-26 MED ORDER — CHLORHEXIDINE GLUCONATE 0.12 % MT SOLN: 15.0000 mL | Freq: Once | OROMUCOSAL | Status: AC

## 2023-11-26 MED ORDER — BISACODYL 5 MG PO TBEC
5.0000 mg | DELAYED_RELEASE_TABLET | Freq: Every day | ORAL | Status: DC | PRN
  Administered 2023-12-01: 5 mg via ORAL
  Filled 2023-11-26: qty 1

## 2023-11-26 MED ORDER — HYDROMORPHONE HCL 1 MG/ML IJ SOLN
INTRAMUSCULAR | Status: AC
  Filled 2023-11-26: qty 0.5

## 2023-11-26 MED ORDER — 0.9 % SODIUM CHLORIDE (POUR BTL) OPTIME
TOPICAL | Status: DC | PRN
  Administered 2023-11-26: 2000 mL

## 2023-11-26 MED ORDER — HYDROMORPHONE HCL 1 MG/ML IJ SOLN
0.5000 mg | INTRAMUSCULAR | Status: DC | PRN
  Administered 2023-11-26 – 2023-11-28 (×9): 1 mg via INTRAVENOUS
  Administered 2023-11-29: 0.5 mg via INTRAVENOUS
  Administered 2023-11-29 – 2023-12-02 (×6): 1 mg via INTRAVENOUS
  Filled 2023-11-26 (×9): qty 1
  Filled 2023-11-26: qty 0.5
  Filled 2023-11-26 (×7): qty 1

## 2023-11-26 MED ORDER — ALBUTEROL SULFATE HFA 108 (90 BASE) MCG/ACT IN AERS
INHALATION_SPRAY | RESPIRATORY_TRACT | Status: AC
  Filled 2023-11-26: qty 6.7

## 2023-11-26 MED ORDER — LABETALOL HCL 5 MG/ML IV SOLN: 10.0000 mg | INTRAVENOUS | Status: DC | PRN

## 2023-11-26 MED ORDER — SODIUM CHLORIDE 0.9 % IV SOLN: INTRAVENOUS | Status: DC

## 2023-11-26 MED ORDER — HEPARIN SODIUM (PORCINE) 1000 UNIT/ML IJ SOLN
INTRAMUSCULAR | Status: DC | PRN
  Administered 2023-11-26: 2000 [IU] via INTRAVENOUS
  Administered 2023-11-26: 10000 [IU] via INTRAVENOUS

## 2023-11-26 MED ORDER — HEPARIN 6000 UNIT IRRIGATION SOLUTION
Status: AC
  Filled 2023-11-26: qty 500

## 2023-11-26 MED ORDER — ROCURONIUM BROMIDE 10 MG/ML (PF) SYRINGE
PREFILLED_SYRINGE | INTRAVENOUS | Status: AC
  Filled 2023-11-26: qty 10

## 2023-11-26 MED ORDER — ONDANSETRON HCL 4 MG/2ML IJ SOLN
4.0000 mg | Freq: Four times a day (QID) | INTRAMUSCULAR | Status: DC | PRN
  Administered 2023-11-26 – 2023-11-28 (×3): 4 mg via INTRAVENOUS
  Filled 2023-11-26 (×3): qty 2

## 2023-11-26 MED ORDER — HYDROMORPHONE HCL 1 MG/ML IJ SOLN
0.2500 mg | INTRAMUSCULAR | Status: DC | PRN
Start: 2023-11-26 — End: 2023-11-26
  Administered 2023-11-26 (×4): 0.25 mg via INTRAVENOUS

## 2023-11-26 MED ORDER — CEFAZOLIN SODIUM-DEXTROSE 2-4 GM/100ML-% IV SOLN
2.0000 g | INTRAVENOUS | Status: AC
  Administered 2023-11-26 (×2): 2 g via INTRAVENOUS

## 2023-11-26 MED ORDER — HEPARIN 6000 UNIT IRRIGATION SOLUTION
Status: DC | PRN
  Administered 2023-11-26: 1

## 2023-11-26 MED ORDER — HEMOSTATIC AGENTS (NO CHARGE) OPTIME
TOPICAL | Status: DC | PRN
  Administered 2023-11-26: 1 via TOPICAL

## 2023-11-26 MED ORDER — ALUM & MAG HYDROXIDE-SIMETH 200-200-20 MG/5ML PO SUSP
15.0000 mL | ORAL | Status: DC | PRN
  Administered 2023-11-27 – 2023-11-29 (×2): 30 mL via ORAL
  Filled 2023-11-26 (×2): qty 30

## 2023-11-26 MED ORDER — LIDOCAINE 2% (20 MG/ML) 5 ML SYRINGE
INTRAMUSCULAR | Status: AC
  Filled 2023-11-26: qty 5

## 2023-11-26 MED ORDER — PROPOFOL 10 MG/ML IV BOLUS
INTRAVENOUS | Status: DC | PRN
  Administered 2023-11-26: 30 mg via INTRAVENOUS
  Administered 2023-11-26 (×2): 40 mg via INTRAVENOUS
  Administered 2023-11-26: 120 mg via INTRAVENOUS
  Administered 2023-11-26: 20 mg via INTRAVENOUS

## 2023-11-26 MED ORDER — GUAIFENESIN-DM 100-10 MG/5ML PO SYRP: 15.0000 mL | ORAL_SOLUTION | ORAL | Status: DC | PRN

## 2023-11-26 MED ORDER — ONDANSETRON HCL 4 MG/2ML IJ SOLN
INTRAMUSCULAR | Status: AC
  Filled 2023-11-26: qty 2

## 2023-11-26 MED ORDER — PROTAMINE SULFATE 10 MG/ML IV SOLN
INTRAVENOUS | Status: DC | PRN
  Administered 2023-11-26: 30 mg via INTRAVENOUS

## 2023-11-26 MED ORDER — CHLORHEXIDINE GLUCONATE CLOTH 2 % EX PADS
6.0000 | MEDICATED_PAD | Freq: Once | CUTANEOUS | Status: DC
Start: 2023-11-26 — End: 2023-11-26

## 2023-11-26 MED ORDER — PANTOPRAZOLE SODIUM 40 MG PO TBEC
40.0000 mg | DELAYED_RELEASE_TABLET | Freq: Every day | ORAL | Status: DC
  Administered 2023-11-27 – 2023-12-02 (×6): 40 mg via ORAL
  Filled 2023-11-26 (×6): qty 1

## 2023-11-26 MED ORDER — HYDRALAZINE HCL 20 MG/ML IJ SOLN: 5.0000 mg | INTRAMUSCULAR | Status: DC | PRN

## 2023-11-26 MED ORDER — SODIUM CHLORIDE 0.9 % IV SOLN
INTRAVENOUS | Status: DC
Start: 2023-11-26 — End: 2023-11-26

## 2023-11-26 MED ORDER — CEFAZOLIN SODIUM 1 G IJ SOLR
INTRAMUSCULAR | Status: AC
  Filled 2023-11-26: qty 20

## 2023-11-26 MED ORDER — ORAL CARE MOUTH RINSE: 15.0000 mL | Freq: Once | OROMUCOSAL | Status: AC

## 2023-11-26 MED ORDER — ALBUTEROL SULFATE HFA 108 (90 BASE) MCG/ACT IN AERS: 1.0000 | INHALATION_SPRAY | Freq: Four times a day (QID) | RESPIRATORY_TRACT | Status: DC | PRN

## 2023-11-26 MED ORDER — PHENOL 1.4 % MT LIQD: 1.0000 | OROMUCOSAL | Status: DC | PRN

## 2023-11-26 MED ORDER — CEFAZOLIN SODIUM-DEXTROSE 2-4 GM/100ML-% IV SOLN
INTRAVENOUS | Status: AC
  Filled 2023-11-26: qty 100

## 2023-11-26 MED ORDER — ONDANSETRON HCL 4 MG/2ML IJ SOLN
INTRAMUSCULAR | Status: DC | PRN
  Administered 2023-11-26: 4 mg via INTRAVENOUS

## 2023-11-26 MED ORDER — SODIUM CHLORIDE 0.9 % IV SOLN: INTRAVENOUS | Status: DC | PRN

## 2023-11-26 MED ORDER — BUPROPION HCL ER (XL) 150 MG PO TB24
150.0000 mg | ORAL_TABLET | Freq: Every day | ORAL | Status: DC
  Administered 2023-11-27 – 2023-12-02 (×6): 150 mg via ORAL
  Filled 2023-11-26 (×6): qty 1

## 2023-11-26 MED ORDER — IPRATROPIUM-ALBUTEROL 0.5-2.5 (3) MG/3ML IN SOLN: 3.0000 mL | Freq: Two times a day (BID) | RESPIRATORY_TRACT | Status: DC | PRN

## 2023-11-26 MED ORDER — ACETAMINOPHEN 325 MG PO TABS: 325.0000 mg | ORAL_TABLET | ORAL | Status: DC | PRN

## 2023-11-26 MED ORDER — ROCURONIUM BROMIDE 10 MG/ML (PF) SYRINGE
PREFILLED_SYRINGE | INTRAVENOUS | Status: DC | PRN
  Administered 2023-11-26 (×2): 30 mg via INTRAVENOUS
  Administered 2023-11-26 (×2): 20 mg via INTRAVENOUS
  Administered 2023-11-26: 60 mg via INTRAVENOUS
  Administered 2023-11-26: 20 mg via INTRAVENOUS

## 2023-11-26 MED ORDER — PHENYLEPHRINE HCL-NACL 20-0.9 MG/250ML-% IV SOLN
INTRAVENOUS | Status: DC | PRN
  Administered 2023-11-26: 20 ug/min via INTRAVENOUS

## 2023-11-26 MED ORDER — HEPARIN SODIUM (PORCINE) 1000 UNIT/ML IJ SOLN
INTRAMUSCULAR | Status: AC
  Filled 2023-11-26: qty 10

## 2023-11-26 MED ORDER — EZETIMIBE 10 MG PO TABS
10.0000 mg | ORAL_TABLET | Freq: Every day | ORAL | Status: DC
  Administered 2023-11-26 – 2023-12-02 (×7): 10 mg via ORAL
  Filled 2023-11-26 (×8): qty 1

## 2023-11-26 MED ORDER — HEPARIN SODIUM (PORCINE) 1000 UNIT/ML IJ SOLN
INTRAMUSCULAR | Status: AC
  Filled 2023-11-26: qty 1

## 2023-11-26 MED ORDER — PHENYLEPHRINE 80 MCG/ML (10ML) SYRINGE FOR IV PUSH (FOR BLOOD PRESSURE SUPPORT)
PREFILLED_SYRINGE | INTRAVENOUS | Status: DC | PRN
  Administered 2023-11-26 (×5): 80 ug via INTRAVENOUS

## 2023-11-26 MED ORDER — ATORVASTATIN CALCIUM 40 MG PO TABS
40.0000 mg | ORAL_TABLET | Freq: Every day | ORAL | Status: DC
  Administered 2023-11-26 – 2023-11-27 (×2): 40 mg via ORAL
  Filled 2023-11-26 (×2): qty 1

## 2023-11-26 MED ORDER — FENTANYL CITRATE (PF) 250 MCG/5ML IJ SOLN
INTRAMUSCULAR | Status: DC | PRN
  Administered 2023-11-26 (×2): 50 ug via INTRAVENOUS
  Administered 2023-11-26: 100 ug via INTRAVENOUS
  Administered 2023-11-26: 50 ug via INTRAVENOUS

## 2023-11-26 MED ORDER — CHLORHEXIDINE GLUCONATE 0.12 % MT SOLN
OROMUCOSAL | Status: AC
  Administered 2023-11-26: 15 mL via OROMUCOSAL
  Filled 2023-11-26: qty 15

## 2023-11-26 MED ORDER — DOCUSATE SODIUM 100 MG PO CAPS
100.0000 mg | ORAL_CAPSULE | Freq: Every day | ORAL | Status: DC
  Administered 2023-11-27 – 2023-12-02 (×6): 100 mg via ORAL
  Filled 2023-11-26 (×6): qty 1

## 2023-11-26 MED ORDER — HEPARIN SODIUM (PORCINE) 5000 UNIT/ML IJ SOLN
5000.0000 [IU] | Freq: Three times a day (TID) | INTRAMUSCULAR | Status: DC
  Administered 2023-11-27 – 2023-12-02 (×16): 5000 [IU] via SUBCUTANEOUS
  Filled 2023-11-26 (×16): qty 1

## 2023-11-26 MED ORDER — POTASSIUM CHLORIDE CRYS ER 20 MEQ PO TBCR: 20.0000 meq | EXTENDED_RELEASE_TABLET | Freq: Every day | ORAL | Status: DC | PRN

## 2023-11-26 MED ORDER — SODIUM CHLORIDE 0.9 % IV SOLN: 500.0000 mL | Freq: Once | INTRAVENOUS | Status: DC | PRN

## 2023-11-26 MED ORDER — POLYETHYLENE GLYCOL 3350 17 G PO PACK: 17.0000 g | PACK | Freq: Every day | ORAL | Status: DC | PRN

## 2023-11-26 MED ORDER — METOPROLOL TARTRATE 5 MG/5ML IV SOLN: 2.0000 mg | INTRAVENOUS | Status: DC | PRN

## 2023-11-26 MED ORDER — LIDOCAINE 2% (20 MG/ML) 5 ML SYRINGE
INTRAMUSCULAR | Status: DC | PRN
  Administered 2023-11-26: 60 mg via INTRAVENOUS

## 2023-11-26 MED ORDER — ACETAMINOPHEN 650 MG RE SUPP: 325.0000 mg | RECTAL | Status: DC | PRN

## 2023-11-26 MED ORDER — FENTANYL CITRATE (PF) 250 MCG/5ML IJ SOLN
INTRAMUSCULAR | Status: AC
  Filled 2023-11-26: qty 5

## 2023-11-26 SURGICAL SUPPLY — 52 items
BAG COUNTER SPONGE SURGICOUNT (BAG) ×4 IMPLANT
BANDAGE ESMARK 6X9 LF (GAUZE/BANDAGES/DRESSINGS) IMPLANT
BNDG ESMARK 6X9 LF (GAUZE/BANDAGES/DRESSINGS) ×3 IMPLANT
CANISTER SUCT 3000ML PPV (MISCELLANEOUS) ×4 IMPLANT
CANNULA VESSEL 3MM 2 BLNT TIP (CANNULA) IMPLANT
CLIP TI MEDIUM 24 (CLIP) ×4 IMPLANT
CLIP TI WIDE RED SMALL 24 (CLIP) ×4 IMPLANT
COVER SURGICAL LIGHT HANDLE (MISCELLANEOUS) IMPLANT
CUFF TOURN SGL QUICK 34 NS (TOURNIQUET CUFF) IMPLANT
CUFF TOURN SGL QUICK 42 (TOURNIQUET CUFF) IMPLANT
CUFF TRNQT CYL 24X4X16.5-23 (TOURNIQUET CUFF) IMPLANT
CUFF TRNQT CYL 34X4.125X (TOURNIQUET CUFF) IMPLANT
DERMABOND ADVANCED .7 DNX12 (GAUZE/BANDAGES/DRESSINGS) IMPLANT
DRAIN CHANNEL 15F RND FF W/TCR (WOUND CARE) IMPLANT
DRAPE HALF SHEET 40X57 (DRAPES) IMPLANT
DRAPE X-RAY CASS 24X20 (DRAPES) IMPLANT
DRSG COVADERM 4X10 (GAUZE/BANDAGES/DRESSINGS) IMPLANT
ELECT REM PT RETURN 9FT ADLT (ELECTROSURGICAL) ×3 IMPLANT
ELECTRODE REM PT RTRN 9FT ADLT (ELECTROSURGICAL) ×4 IMPLANT
EVACUATOR SILICONE 100CC (DRAIN) IMPLANT
GLOVE BIO SURGEON STRL SZ7.5 (GLOVE) ×4 IMPLANT
GLOVE BIOGEL PI IND STRL 8 (GLOVE) ×4 IMPLANT
GOWN STRL REUS W/ TWL LRG LVL3 (GOWN DISPOSABLE) ×8 IMPLANT
GOWN STRL REUS W/ TWL XL LVL3 (GOWN DISPOSABLE) ×8 IMPLANT
HEMOSTAT SNOW SURGICEL 2X4 (HEMOSTASIS) IMPLANT
HEMOSTAT SPONGE AVITENE ULTRA (HEMOSTASIS) IMPLANT
INSERT FOGARTY SM (MISCELLANEOUS) IMPLANT
KIT BASIN OR (CUSTOM PROCEDURE TRAY) ×4 IMPLANT
KIT TURNOVER KIT B (KITS) ×4 IMPLANT
NS IRRIG 1000ML POUR BTL (IV SOLUTION) ×8 IMPLANT
PACK PERIPHERAL VASCULAR (CUSTOM PROCEDURE TRAY) ×4 IMPLANT
PAD ARMBOARD 7.5X6 YLW CONV (MISCELLANEOUS) ×8 IMPLANT
PUNCH AORTIC ROTATE 4.0MM (MISCELLANEOUS) IMPLANT
SET COLLECT BLD 21X3/4 12 (NEEDLE) IMPLANT
SPONGE T-LAP 18X18 ~~LOC~~+RFID (SPONGE) IMPLANT
STOPCOCK 4 WAY LG BORE MALE ST (IV SETS) IMPLANT
SUT ETHILON 3 0 PS 1 (SUTURE) IMPLANT
SUT GORETEX 5 0 TT13 24 (SUTURE) IMPLANT
SUT GORETEX 6.0 TT13 (SUTURE) IMPLANT
SUT MNCRL AB 4-0 PS2 18 (SUTURE) ×12 IMPLANT
SUT PROLENE 5 0 C 1 24 (SUTURE) ×4 IMPLANT
SUT PROLENE 6 0 BV (SUTURE) ×4 IMPLANT
SUT PROLENE 7 0 BV 1 (SUTURE) IMPLANT
SUT SILK 2 0 PERMA HAND 18 BK (SUTURE) IMPLANT
SUT SILK 3-0 18XBRD TIE 12 (SUTURE) IMPLANT
SUT VIC AB 2-0 CT1 TAPERPNT 27 (SUTURE) ×8 IMPLANT
SUT VIC AB 3-0 SH 27X BRD (SUTURE) ×12 IMPLANT
TOWEL GREEN STERILE (TOWEL DISPOSABLE) ×4 IMPLANT
TRAY FOLEY MTR SLVR 16FR STAT (SET/KITS/TRAYS/PACK) ×4 IMPLANT
TUBING EXTENTION W/L.L. (IV SETS) IMPLANT
UNDERPAD 30X36 HEAVY ABSORB (UNDERPADS AND DIAPERS) ×4 IMPLANT
WATER STERILE IRR 1000ML POUR (IV SOLUTION) ×4 IMPLANT

## 2023-11-26 NOTE — H&P (Addendum)
Patient name: Tyrone [name].    MRN: [id number]        DOB: Oct 12, 1956          Sex: male   REASON FOR CONSULT: Triage, occluded right leg bypass   HPI: Seng Larch. is a 68 y.o. male, with history of COPD, hypertension, prostate cancer, tobacco abuse that presents for triage visit for occluded right leg bypass.  States he feels like he pulled a muscle in his right leg in the summer.  Has been having increasing pain in the right foot worsening over the last 2 to 3 months.  No tissue loss.  Was smoking 1 and half packs a day and states he is down to 5 cigarettes.  States he stopped his statin since it causes cancer.     He was initially evaluated for severe PAD.  He most recently underwent a right common femoral endarterectomy with bovine patch and a right common femoral to below-knee popliteal bypass with PTFE on 03/06/2022 for CLI with rest pain.  He also previously had a left leg bypass on 10/24/2021 with a left common femoral endarterectomy and bovine patch and a left common femoral to below knee popliteal artery bypass with PTFE.               Past Medical History:  Diagnosis Date   Arthritis      hands, knees, back   COPD (chronic obstructive pulmonary disease) (HCC)     Depression     Diskitis 02/28/2021   Hardware complicating wound infection (HCC) 02/28/2021   Headache      migraines   Hypertension     Legionella pneumonia (HCC) 02/28/2021   Neuromuscular disorder (HCC)      hands   Peripheral vascular disease (HCC)     Pre-diabetes     Prostate cancer (HCC)     Smoker 11/05/2021   Vaccine counseling 11/05/2021               Past Surgical History:  Procedure Laterality Date   ABDOMINAL AORTOGRAM W/LOWER EXTREMITY Bilateral 10/18/2021    Procedure: ABDOMINAL AORTOGRAM W/LOWER EXTREMITY;  Surgeon: Cephus Shelling, MD;  Location: [facility];  Service: Cardiovascular;  Laterality: Bilateral;   ABDOMINAL AORTOGRAM W/LOWER EXTREMITY Right 02/28/2022     Procedure: ABDOMINAL AORTOGRAM W/LOWER EXTREMITY;  Surgeon: Cephus Shelling, MD;  Location: [facility];  Service: Cardiovascular;  Laterality: Right;   APPENDECTOMY       BACK SURGERY   1980"s    L4 to L 5 laminectomy   COLONOSCOPY WITH PROPOFOL N/A 02/06/2016    Procedure: COLONOSCOPY WITH PROPOFOL;  Surgeon: Charolett Bumpers, MD;  Location: [facility];  Service: Endoscopy;  Laterality: N/A;   DIAGNOSTIC LAPAROSCOPY       ENDARTERECTOMY FEMORAL Left 10/24/2021    Procedure: ENDARTERECTOMY FEMORAL WITH PROFUNDAPLASTY;  Surgeon: Cephus Shelling, MD;  Location: [facility] OR;  Service: Vascular;  Laterality: Left;   ENDARTERECTOMY FEMORAL Right 03/06/2022    Procedure: RIGHT COMMON FEMORAL ENDARTERECTOMY;  Surgeon: Cephus Shelling, MD;  Location: [facility] OR;  Service: Vascular;  Laterality: Right;   FEMORAL-POPLITEAL BYPASS GRAFT Left 10/24/2021    Procedure: LEFT COMMON FEMORAL- BELOW KNEE POPLITEAL BYPASS;  Surgeon: Cephus Shelling, MD;  Location: [facility] OR;  Service: Vascular;  Laterality: Left;   FEMORAL-POPLITEAL BYPASS GRAFT Right 03/06/2022    Procedure: RIGHT FEMORAL-POPLITEAL BYPASS WITH GORETEC GRAFT;  Surgeon: Cephus Shelling, MD;  Location: [facility] OR;  Service: Vascular;  Laterality: Right;  INSERT ARTERIAL LINE   LAPAROSCOPIC APPENDECTOMY N/A 08/13/2018    Procedure: APPENDECTOMY LAPAROSCOPIC;  Surgeon: Ovidio Kin, MD;  Location: [facility] ORS;  Service: General;  Laterality: N/A;   PATCH ANGIOPLASTY Left 10/24/2021    Procedure: PATCH ANGIOPLASTY;  Surgeon: Cephus Shelling, MD;  Location: [facility] OR;  Service: Vascular;  Laterality: Left;   PELVIC LYMPH NODE DISSECTION Bilateral 02/08/2019    Procedure: PELVIC LYMPH NODE DISSECTION;  Surgeon: Crista Elliot, MD;  Location: [facility];  Service: Urology;  Laterality: Bilateral;   ROBOT ASSISTED LAPAROSCOPIC RADICAL PROSTATECTOMY N/A 02/08/2019    Procedure: XI ROBOTIC ASSISTED LAPAROSCOPIC RADICAL PROSTATECTOMY;  Surgeon: Crista Elliot, MD;  Location: [facility];  Service: Urology;  Laterality: N/A;   TRANSFORAMINAL LUMBAR INTERBODY FUSION (TLIF) WITH PEDICLE SCREW FIXATION 1 LEVEL Left 09/12/2020    Procedure: LEFT-SIDED LUMBAR 1 - LUMBAR 2 TRANSFORAMINAL LUMBAR INTERBODY FUSION WITH INSTRUMENTATION AND ALLOGRAFT;  Surgeon: Estill Bamberg, MD;  Location: [facility] OR;  Service: Orthopedics;  Laterality: Left;  3C BED   VEIN HARVEST Left 10/24/2021    Procedure: VEIN HARVEST;  Surgeon: Cephus Shelling, MD;  Location: [facility] OR;  Service: Vascular;  Laterality: Left;   WRIST SURGERY Right      x 2               Family History  Problem Relation Age of Onset   Colon cancer Maternal Grandmother     Colon cancer Maternal Great-grandmother     Prostate cancer Neg Hx            SOCIAL HISTORY: Social History         Socioeconomic History   Marital status: Married      Spouse name: Not on file   Number of children: 6   Years of education: Not on file   Highest education level: Not on file  Occupational History   Occupation: Courtyard  Tobacco Use   Smoking status: Former      Current packs/day: 0.00      Types: Cigarettes      Quit date: 02/02/2022      Years since quitting: 1.6   Smokeless tobacco: Never   Tobacco comments:      Smoked 2 pkgs a day for 51 years  Vaping Use   Vaping status: Never Used  Substance and Sexual Activity   Alcohol use: Not Currently      Comment: occasional   Drug use: No   Sexual activity: Not Currently  Other Topics Concern   Not on file  Social History Narrative    Married for 46 years with 3 sons and 3 daughters. Smokes a ppd.     Social Determinants of Health        Financial Resource Strain: Not on file  Food Insecurity: No Food Insecurity (01/06/2023)    Hunger Vital Sign     Worried About Running Out of Food in the Last Year: Never true     Ran Out of Food in the Last Year: Never true  Transportation Needs: No Transportation Needs (01/06/2023)    PRAPARE -  Therapist, art (Medical): No     Lack of Transportation (Non-Medical): No  Physical Activity: Not on file  Stress: Not on file  Social Connections: Not on file  Intimate Partner Violence: Not At Risk (01/06/2023)    Humiliation, Afraid, Rape, and Kick  questionnaire     Fear of Current or Ex-Partner: No     Emotionally Abused: No     Physically Abused: No     Sexually Abused: No      Allergies       Allergies  Allergen Reactions   Neurontin [Gabapentin] Itching and Other (See Comments)      Confusion and sedation.   Celexa [Citalopram Hydrobromide]        restless leg/insomnia worse   Topamax [Topiramate]        numbness in hand              Current Outpatient Medications  Medication Sig Dispense Refill   aspirin 325 MG tablet Take 325 mg by mouth every evening.       buPROPion (WELLBUTRIN XL) 150 MG 24 hr tablet Take 150 mg by mouth daily.       ezetimibe (ZETIA) 10 MG tablet TAKE 1 TABLET BY MOUTH DAILY 30 tablet 0   ipratropium-albuterol (DUONEB) 0.5-2.5 (3) MG/3ML SOLN Take 3 mLs by nebulization 2 (two) times daily. 360 mL 0   metFORMIN (GLUCOPHAGE) 500 MG tablet 1 tablet with a meal Orally Once a day for 90 days       Multiple Vitamin (MULTIVITAMIN WITH MINERALS) TABS tablet Take 1 tablet by mouth every evening. Centrum Silver       oxymetazoline (AFRIN) 0.05 % nasal spray Place 1 spray into both nostrils 2 (two) times daily as needed for congestion.       atorvastatin (LIPITOR) 40 MG tablet Take 1 tablet (40 mg total) by mouth daily. (Patient not taking: Reported on 01/06/2023) 90 tablet 3   guaiFENesin (MUCINEX) 600 MG 12 hr tablet Take 2 tablets (1,200 mg total) by mouth 2 (two) times daily.       morphine (MSIR) 15 MG tablet Take 15 mg by mouth every 6 (six) hours as needed for moderate pain (PAIN.).       nicotine (NICODERM CQ - DOSED IN MG/24 HOURS) 14 mg/24hr patch Place 14 mg onto the skin daily.       predniSONE (DELTASONE) 20 MG tablet  Label  & dispense according to the schedule below. 5 Pills PO on day one then, 4 Pills PO on day two, 3 Pills PO on day three, 2Pills PO on day four, 1 Pill PO on day five,  then STOP.  Total of 15 tabs 15 tablet 0      No current facility-administered medications for this visit.        REVIEW OF SYSTEMS:  [X]  denotes positive finding, [ ]  denotes negative finding Cardiac   Comments:  Chest pain or chest pressure:      Shortness of breath upon exertion:      Short of breath when lying flat:      Irregular heart rhythm:             Vascular      Pain in calf, thigh, or hip brought on by ambulation:      Pain in feet at night that wakes you up from your sleep:       Blood clot in your veins:      Leg swelling:              Pulmonary      Oxygen at home:      Productive cough:       Wheezing:  Neurologic      Sudden weakness in arms or legs:       Sudden numbness in arms or legs:       Sudden onset of difficulty speaking or slurred speech:      Temporary loss of vision in one eye:       Problems with dizziness:              Gastrointestinal      Blood in stool:       Vomited blood:              Genitourinary      Burning when urinating:       Blood in urine:             Psychiatric      Major depression:              Hematologic      Bleeding problems:      Problems with blood clotting too easily:             Skin      Rashes or ulcers:             Constitutional      Fever or chills:          PHYSICAL EXAM:    Vitals:    09/23/23 0955  BP: 138/83  Pulse: 95  Temp: 98 F (36.7 C)  TempSrc: Temporal  SpO2: 94%  Weight: 231 lb 11.2 oz (105.1 kg)  Height: 5\' 10"  (1.778 m)      GENERAL: The patient is a well-nourished male, in no acute distress. The vital signs are documented above. CARDIAC: There is a regular rate and rhythm.  VASCULAR:  Bilateral femoral pulses palpable Left DP palpable No palpable right pedal pulses   DATA:     ABIs today are 0.43 right monophasic and 0.88 left triphasic     Lower extremity arterial duplex shows patent left femoropopliteal bypass with occluded right femoropopliteal bypass     Assessment/Plan:   68 year old male presents for triage visit of occluded right leg bypass.     He underwent a right common femoral endarterectomy with bovine patch and a right common femoral to below-knee popliteal bypass with PTFE on 03/06/2022 for CLI with rest pain.  He also previously had a left leg bypass on 10/24/2021 with a left common femoral endarterectomy and bovine patch and a left common femoral to below knee popliteal artery bypass with PTFE.     Discussed his right femoropopliteal bypass is occluded.  Sounds like this has been ongoing since the summertime.  I was hopeful that his femoral endarterectomy would prevent him from having recurrent CLI symptoms when his bypass occluded but unfortunately has had progressive symptoms over the last 2 to 3 months.    He underwent aortogram on 10/30/2023 now presents for right common femoral to anterior tibial bypass.  Previous femoral-popliteal bypass is occluded but his femoral endarterectomy site is widely patent.  Discussed vein mapping does not show very vein and may require plastic bypass.  I discussed poor durability and high risk of infection.  I quoted him 3 to 6 months patency.  States he cannot tolerate his symptoms with rest pain.   Cephus Shelling, MD [facility] Office: [phone]

## 2023-11-26 NOTE — Op Note (Signed)
Date: November 26, 2023  Preoperative diagnosis:  Critical limb ischemia of the right lower extremity with rest pain Occluded right common femoral to below-knee popliteal PTFE bypass  Postoperative diagnosis: Same  Procedure: 1.  Redo exposure of right common femoral artery greater than 30 days 2.  Harvest of right leg great saphenous vein 3.  Right common femoral artery to anterior tibial bypass with ipsilateral nonreversed great saphenous vein  Surgeon: Dr. Cephus Shelling, MD  Assistant: Dr. Gillis Santa, MD; Mosetta Pigeon, [place]; Loel Dubonnet, [place]  Indications: 68 year old male that has previously undergone a right common femoral endarterectomy with right common femoral to below-knee popliteal PTFE bypass.  Bypass is now known to be occluded.  He has rest pain.  He cannot tolerate his level of ischemia.  He presents for tibial bypass.  I discussed high risk having to use PTFE given vein mapping findings show marginal vein.  An assistant was needed given the complexity of the case and also for harvesting vein including sewing the proximal and distal anastomosis.  Findings: Redo exposure of the right common femoral artery that was very scarred and and the dissection was tedious.  Ultimately the right saphenous vein was harvested from the saphenofemoral junction down to the proximal calf and this was marginal but it did dilate nicely.  I then sewed this nonreversed end to side to the right common femoral artery tunneled laterally in the subcutaneous space and then sewn end to side to the anterior tibial artery.  Brisk AT signal in the foot at completion.  Anesthesia: General  Details: Patient was taken to the operating room after informed consent was obtained.  Placed on operative table in the supine position.  General endotracheal anesthesia was induced.  I marked out the great saphenous vein in the right leg and this looked marginal but potentially usable.  The right groin and right  leg were then prepped and draped in standard sterile fashion.  Antibiotics were given and timeout performed.  Initially started in the right groin with a transverse groin incision through the old groin incision.  Dissected through subcutaneous tissue with Bovie cautery.  Cerebellar retractors were used for added visualization.  I found the hood of the old PTFE bypass and this was traced onto the common femoral artery and dissected out.  There was dense adhesions and this was a very tedious dissection.  He is also obese that made this more difficult.  Dr. Karin Lieu was available to help with this dissection and for retraction of his pannus.  Ultimately I transected the PTFE graft off the common femoral and oversewed the stump on the common femoral with a 5-0 Prolene and the remaining PTFE in the wound was totally excised.  I got a nice clamp site for a Satinsky clamp.  The anterior common femoral artery was soft.  I then went down to the lateral calf and made a longitudinal proximally and found the septum between anterior lateral compartments and exposed the anterior tibial artery.  This was then controlled with a vessel loop.  This was soft for anastomosis.  I then made three skip incisions down the right leg on the medial thigh and calf and through these dissected out the great saphenous vein and all side branches were ligated between 3-0 silk ties and divided.  The vein was marginal but it did look potentially usable.  I harvested this all the way down to the proximal to mid calf where it was no longer usable after  it branched.  This was transected over right angle clamp and then passed between the skin bridges all the way up to the saphenofemoral junction where it was transected over a Satinsky clamp and then this saphenofemoral junction was oversewn with a 5-0 Prolene.  The vein graft was then reversed with a vessel cannula and this did dilate nicely.  I did repair several areas with 6-0 Prolene.  We then brought  this on the field in nonreversed fashion.  Patient was given 100 units/kg IV heparin.  I did tunnel from the anterior tibial exposure up to the common femoral in a lateral subcutaneous plane.  I used a Satinsky clamp on the common femoral and then opened the anterior wall of the common femoral and then used an aortic punch.  The vein was spatulated and end to side anastomosis was sewn to the right common femoral artery with 6-0 Prolene parachute technique.  Once we came off clamps we had no flow distally given this was nonreversed.  I then used a mills valvulotome and carefully lysed all the valves and we had good pulsatile flow distally.  I put 2 medium clips to mark if orientation.  It was then passed through the Farber tunneler to the anterior tibial exposure and we ensured not to twist it.  I then put a tourniquet on the upper thigh and exsanguinated the leg with an Esmarch.  The tournaquet was inflated to 250 mm Hg.  I opened the anterior tibial with a 11 blade scalpel and Potts scissors.  I then sewed end to side anastomosis with 6-0 Prolene parachute technique to the AT with the help of my assistant.  This was de-aired prior to completion.  We had excellent Doppler signal in the anterior tibial distal to the bypass with a brisk AT signal in the foot.  Ultimately protamine was given.  All the incisions were irrigated out and closed in multiple layers of 2-0 Vicryl 3-0 Vicryl 4-0 Monocryl and dermabond including the groin and the vein harvest.  The anterior tibial exposure was closed with interrupted 3-0 nylons.  Taken to recovery in stable condition.  Complication: None  Condition: Stable  Cephus Shelling, MD [facility] [place] Office: [phone]   Cephus Shelling

## 2023-11-26 NOTE — Anesthesia Postprocedure Evaluation (Signed)
Anesthesia Post Note  Patient: Shaurya Rawdon.  Procedure(s) Performed: COMMON FEMORAL TO ANTERIOR TIBIAL ARTERY BYPASS USING GREATER SAPHENOUS VEIN (Right) VEIN HARVEST OF RIGHT GREATER SAPHANEOUS VEIN (Right: Leg Lower) REDO RIGHT GROIN EXPOSURE (Right: Groin)     Patient location during evaluation: PACU Anesthesia Type: General Level of consciousness: awake and alert Pain management: pain level controlled Vital Signs Assessment: post-procedure vital signs reviewed and stable Respiratory status: spontaneous breathing, nonlabored ventilation, respiratory function stable and patient connected to nasal cannula oxygen Cardiovascular status: blood pressure returned to baseline and stable Postop Assessment: no apparent nausea or vomiting Anesthetic complications: no  No notable events documented.  Last Vitals:  Vitals:   11/26/23 1900 11/26/23 1915  BP: 119/63 135/75  Pulse: (!) 106 (!) 105  Resp: 13 20  Temp:    SpO2: 94% 96%    Last Pain:  Vitals:   11/26/23 1915  TempSrc:   PainSc: 10-Worst pain ever                 Trevor Iha

## 2023-11-26 NOTE — Transfer of Care (Signed)
Immediate Anesthesia Transfer of Care Note  Patient: Tyrone [name].  Procedure(s) Performed: COMMON FEMORAL TO ANTERIOR TIBIAL ARTERY BYPASS USING GREATER SAPHENOUS VEIN (Right) VEIN HARVEST OF RIGHT GREATER SAPHANEOUS VEIN (Right: Leg Lower) REDO RIGHT GROIN EXPOSURE (Right: Groin)  Patient Location: PACU  Anesthesia Type:General  Level of Consciousness: drowsy and patient cooperative  Airway & Oxygen Therapy: Patient Spontanous Breathing and Patient connected to nasal cannula oxygen  Post-op Assessment: Report given to RN and Post -op Vital signs reviewed and stable  Post vital signs: Reviewed and stable  Last Vitals:  Vitals Value Taken Time  BP 138/89 11/26/23 1653  Temp    Pulse 99 11/26/23 1659  Resp 19 11/26/23 1659  SpO2 96 % 11/26/23 1659  Vitals shown include unfiled device data.  Last Pain:  Vitals:   11/26/23 1016  TempSrc:   PainSc: 8       Patients Stated Pain Goal: 5 (11/26/23 1016)  Complications: No notable events documented.

## 2023-11-26 NOTE — Anesthesia Procedure Notes (Signed)
Procedure Name: Intubation Date/Time: 11/26/2023 11:39 AM  Performed by: Gus Puma, CRNAPre-anesthesia Checklist: Patient identified, Emergency Drugs available, Suction available and Patient being monitored Patient Re-evaluated:Patient Re-evaluated prior to induction Oxygen Delivery Method: Circle System Utilized Preoxygenation: Pre-oxygenation with 100% oxygen Induction Type: IV induction Ventilation: Mask ventilation without difficulty Laryngoscope Size: Mac and 4 Grade View: Grade I Tube type: Oral Tube size: 7.5 mm Number of attempts: 1 Airway Equipment and Method: Stylet and Oral airway Placement Confirmation: ETT inserted through vocal cords under direct vision, positive ETCO2 and breath sounds checked- equal and bilateral Secured at: 23 cm Tube secured with: Tape Dental Injury: Teeth and Oropharynx as per pre-operative assessment

## 2023-11-26 NOTE — Anesthesia Procedure Notes (Signed)
Arterial Line Insertion Start/End2/09/2024 10:00 AM, 11/26/2023 10:05 AM Performed by: Gaynelle Adu, MD, Emili Mcloughlin Rochele Raring, CRNA, CRNA  Patient location: Pre-op. Preanesthetic checklist: patient identified, IV checked, site marked, risks and benefits discussed, surgical consent, monitors and equipment checked, pre-op evaluation, timeout performed and anesthesia consent Lidocaine 1% used for infiltration Left, radial was placed Catheter size: 20 G Hand hygiene performed  and maximum sterile barriers used  Allen's test indicative of satisfactory collateral circulation Attempts: 1 Procedure performed without using ultrasound guided technique. Following insertion, dressing applied and Biopatch. Post procedure assessment: normal and unchanged  Patient tolerated the procedure well with no immediate complications.

## 2023-11-26 NOTE — Anesthesia Preprocedure Evaluation (Addendum)
Anesthesia Evaluation  Patient identified by MRN, date of birth, ID band Patient awake    Reviewed: Allergy & Precautions, H&P , NPO status , Patient's Chart, lab work & pertinent test results  Airway Mallampati: II  TM Distance: >3 FB Neck ROM: Full    Dental no notable dental hx. (+) Edentulous Upper, Edentulous Lower, Dental Advisory Given   Pulmonary COPD,  COPD inhaler, former smoker   Pulmonary exam normal breath sounds clear to auscultation       Cardiovascular hypertension, Pt. on medications + Peripheral Vascular Disease   Rhythm:Regular Rate:Normal     Neuro/Psych  Headaches   Depression       GI/Hepatic negative GI ROS, Neg liver ROS,,,  Endo/Other  negative endocrine ROS    Renal/GU negative Renal ROS  negative genitourinary   Musculoskeletal  (+) Arthritis , Osteoarthritis,    Abdominal   Peds  Hematology negative hematology ROS (+)   Anesthesia Other Findings   Reproductive/Obstetrics negative OB ROS                             Anesthesia Physical Anesthesia Plan  ASA: 3  Anesthesia Plan: General   Post-op Pain Management: Ofirmev IV (intra-op)*   Induction: Intravenous  PONV Risk Score and Plan: 3 and Ondansetron, Dexamethasone and Treatment may vary due to age or medical condition  Airway Management Planned: Oral ETT  Additional Equipment: Arterial line  Intra-op Plan:   Post-operative Plan: Extubation in OR  Informed Consent: I have reviewed the patients History and Physical, chart, labs and discussed the procedure including the risks, benefits and alternatives for the proposed anesthesia with the patient or authorized representative who has indicated his/her understanding and acceptance.     Dental advisory given  Plan Discussed with: CRNA  Anesthesia Plan Comments:        Anesthesia Quick Evaluation

## 2023-11-27 ENCOUNTER — Encounter (HOSPITAL_COMMUNITY): Payer: Self-pay | Admitting: Vascular Surgery

## 2023-11-27 LAB — BASIC METABOLIC PANEL
Anion gap: 8 (ref 5–15)
BUN: 20 mg/dL (ref 8–23)
CO2: 22 mmol/L (ref 22–32)
Calcium: 7.7 mg/dL — ABNORMAL LOW (ref 8.9–10.3)
Chloride: 107 mmol/L (ref 98–111)
Creatinine, Ser: 1.28 mg/dL — ABNORMAL HIGH (ref 0.61–1.24)
GFR, Estimated: >60 mL/min (ref >60)
Glucose, Bld: 140 mg/dL — ABNORMAL HIGH (ref 70–99)
Potassium: 4.9 mmol/L (ref 3.5–5.1)
Sodium: 137 mmol/L (ref 135–145)

## 2023-11-27 LAB — CBC
HCT: 32.7 % — ABNORMAL LOW (ref 39.0–52.0)
Hemoglobin: 11.3 g/dL — ABNORMAL LOW (ref 13.0–17.0)
MCH: 31.5 pg (ref 26.0–34.0)
MCHC: 34.6 g/dL (ref 30.0–36.0)
MCV: 91.1 fL (ref 80.0–100.0)
Platelets: 231 10*3/uL (ref 150–400)
RBC: 3.59 MIL/uL — ABNORMAL LOW (ref 4.22–5.81)
RDW: 12.7 % (ref 11.5–15.5)
WBC: 9.4 10*3/uL (ref 4.0–10.5)
nRBC: 0 % (ref 0.0–0.2)

## 2023-11-27 LAB — POCT ACTIVATED CLOTTING TIME
Activated Clotting Time: 228 s
Activated Clotting Time: 268 s

## 2023-11-27 LAB — LIPID PANEL
Cholesterol: 92 mg/dL (ref 0–200)
HDL: 39 mg/dL — ABNORMAL LOW (ref >40)
LDL Cholesterol: 35 mg/dL (ref 0–99)
Total CHOL/HDL Ratio: 2.4 {ratio}
Triglycerides: 89 mg/dL (ref <150)
VLDL: 18 mg/dL (ref 0–40)

## 2023-11-27 MED ORDER — ATORVASTATIN CALCIUM 80 MG PO TABS
80.0000 mg | ORAL_TABLET | Freq: Every day | ORAL | Status: DC
  Administered 2023-11-28 – 2023-12-02 (×5): 80 mg via ORAL
  Filled 2023-11-27 (×6): qty 1

## 2023-11-27 MED ORDER — ATORVASTATIN CALCIUM 40 MG PO TABS
40.0000 mg | ORAL_TABLET | Freq: Once | ORAL | Status: AC
Start: 2023-11-27 — End: 2023-11-27
  Administered 2023-11-27: 40 mg via ORAL
  Filled 2023-11-27: qty 1

## 2023-11-27 NOTE — Progress Notes (Signed)
OT Cancellation Note and Discharge  Patient Details Name: Tyrone [name]. MRN: [id number] DOB: [birth year]   Cancelled Treatment:    Reason Eval/Treat Not Completed: OT screened, no needs identified, will sign off. Noted activity order and in to see patient with his wife present. This is his 3rd rodeo with this type of surgery and he and wife feel they have it all under control when it comes to ADLs, that pt's pain and mobility are what he needs to focus on. Pt's sister is also available to help.  Lindon Romp OT Acute Rehabilitation Services Office [phone]    Evette Georges 11/27/2023, 1:28 PM

## 2023-11-27 NOTE — Evaluation (Signed)
Physical Therapy Evaluation Patient Details Name: Tyrone [name]. MRN: [id number] DOB: 12/05/1955 Today's Date: 11/27/2023  History of Present Illness  68 yo male admitted 2/12 for Rt common femoral artery to anterior tibial bypass due to occluded bypass. PMhx: COPD, HTN, prostate CA, PAD  Clinical Impression  Pt pleasant and reports 7/10 at present which is about the same as pre op pain but with change to incisional pain as well as entire leg. Pt with decreased ROM, strength and functional mobility limited by pain. Pt states he can manage at home with assist of family and does not want to consider other post acute rehab options. Pt will benefit from acute therapy to maximize strength, ROM, transfers and function to decrease burden of care.          If plan is discharge home, recommend the following: A little help with walking and/or transfers;A little help with bathing/dressing/bathroom;Assistance with cooking/housework;Assist for transportation;Help with stairs or ramp for entrance   Can travel by private vehicle        Equipment Recommendations None recommended by PT  Recommendations for Other Services       Functional Status Assessment Patient has had a recent decline in their functional status and demonstrates the ability to make significant improvements in function in a reasonable and predictable amount of time.     Precautions / Restrictions Precautions Precautions: Fall      Mobility  Bed Mobility Overal bed mobility: Needs Assistance Bed Mobility: Supine to Sit     Supine to sit: Min assist, HOB elevated, Used rails     General bed mobility comments: HOB 30 degrees with use of rail, min assist to lift and move leg to pivot to EOB    Transfers Overall transfer level: Needs assistance   Transfers: Sit to/from Stand Sit to Stand: Min assist           General transfer comment: min assist to rise from surface with cues for hand placement, safety and  increased time. Stand pivot with RW with very limited tolerance for weight on RLE and pt scooting Left foot to pivot to chair with reliance on RW    Ambulation/Gait               General Gait Details: not yet able to tolerate  Stairs            Wheelchair Mobility     Tilt Bed    Modified Rankin (Stroke Patients Only)       Balance Overall balance assessment: No apparent balance deficits (not formally assessed)                                           Pertinent Vitals/Pain Pain Assessment Pain Assessment: 0-10 Pain Score: 7  Pain Location: RLE Pain Descriptors / Indicators: Aching, Discomfort Pain Intervention(s): Limited activity within patient's tolerance, Monitored during session, Premedicated before session, Repositioned    Home Living Family/patient expects to be discharged to:: Private residence Living Arrangements: Spouse/significant other (spouse and sister) Available Help at Discharge: Family;Available 24 hours/day Type of Home: House Home Access: Stairs to enter   Entrance Stairs-Number of Steps: 1 Alternate Level Stairs-Number of Steps: flight Home Layout: Two level;Bed/bath upstairs Home Equipment: Rollator (4 wheels);Shower seat;BSC/3in1      Prior Function Prior Level of Function : Independent/Modified Independent;Driving  Mobility Comments: walking without AD, limited tolerance due to pain       Extremity/Trunk Assessment   Upper Extremity Assessment Upper Extremity Assessment: Overall WFL for tasks assessed    Lower Extremity Assessment Lower Extremity Assessment: RLE deficits/detail RLE Deficits / Details: grossly 45degree knee flexion limited by pain RLE: Unable to fully assess due to pain    Cervical / Trunk Assessment Cervical / Trunk Assessment: Normal  Communication   Communication Communication: No apparent difficulties    Cognition Arousal: Alert Behavior During Therapy: WFL  for tasks assessed/performed   PT - Cognitive impairments: No apparent impairments                         Following commands: Intact       Cueing Cueing Techniques: Verbal cues     General Comments      Exercises General Exercises - Lower Extremity Short Arc Quad: AAROM, Right, Seated, 5 reps Heel Slides: AAROM, Right, Supine, 5 reps   Assessment/Plan    PT Assessment Patient needs continued PT services  PT Problem List Decreased strength;Decreased range of motion;Decreased activity tolerance;Decreased balance;Decreased mobility;Decreased knowledge of use of DME       PT Treatment Interventions DME instruction;Gait training;Stair training;Functional mobility training;Therapeutic activities;Therapeutic exercise;Patient/family education;Balance training    PT Goals (Current goals can be found in the Care Plan section)  Acute Rehab PT Goals Patient Stated Goal: return home PT Goal Formulation: With patient Time For Goal Achievement: 12/11/23 Potential to Achieve Goals: Good    Frequency Min 1X/week     Co-evaluation               AM-PAC PT "6 Clicks" Mobility  Outcome Measure Help needed turning from your back to your side while in a flat bed without using bedrails?: A Little Help needed moving from lying on your back to sitting on the side of a flat bed without using bedrails?: A Little Help needed moving to and from a bed to a chair (including a wheelchair)?: A Little Help needed standing up from a chair using your arms (e.g., wheelchair or bedside chair)?: A Little Help needed to walk in hospital room?: A Lot Help needed climbing 3-5 steps with a railing? : Total 6 Click Score: 15    End of Session Equipment Utilized During Treatment: Gait belt Activity Tolerance: Patient tolerated treatment well;Patient limited by pain Patient left: in chair;with call bell/phone within reach;with chair alarm set Nurse Communication: Mobility status PT Visit  Diagnosis: Other abnormalities of gait and mobility (R26.89);Difficulty in walking, not elsewhere classified (R26.2);Pain Pain - Right/Left: Right Pain - part of body: Leg    Time: 0832-0900 PT Time Calculation (min) (ACUTE ONLY): 28 min   Charges:   PT Evaluation $PT Eval Moderate Complexity: 1 Mod PT Treatments $Therapeutic Activity: 8-22 mins PT General Charges $$ ACUTE PT VISIT: 1 Visit         Merryl Hacker, PT Acute Rehabilitation Services Office: [phone]   Enedina Finner Arcangel Minion 11/27/2023, 9:29 AM

## 2023-11-27 NOTE — Progress Notes (Signed)
PHARMACIST LIPID MONITORING   Tyrone [name]. is a 68 y.o. male admitted on 11/26/2023 with PVD.  Pharmacy has been consulted to optimize lipid-lowering therapy with the indication of secondary prevention for clinical ASCVD.  Recent Labs:  Lipid Panel (last 6 months):   Lab Results  Component Value Date   CHOL 92 11/27/2023   TRIG 89 11/27/2023   HDL 39 (L) 11/27/2023   CHOLHDL 2.4 11/27/2023   VLDL 18 11/27/2023   LDLCALC 35 11/27/2023    Hepatic function panel (last 6 months):   Lab Results  Component Value Date   AST 29 11/20/2023   ALT 44 11/20/2023   ALKPHOS 108 11/20/2023   BILITOT 0.6 11/20/2023    SCr (since admission):   Serum creatinine: 1.28 mg/dL (H) 16/10/96 0454 Estimated creatinine clearance: 68.4 mL/min (A)  Current therapy and lipid therapy tolerance Current lipid-lowering therapy: Lipitor 40/zetia 10 Previous lipid-lowering therapies (if applicable):  Documented or reported allergies or intolerances to lipid-lowering therapies (if applicable):   Assessment:   Patient agrees with changes to lipid-lowering therapy  Plan:    1.Statin intensity (high intensity recommended for all patients regardless of the LDL):  Add or increase statin to high intensity.  2.Add ezetimibe (if any one of the following):   Not indicated at this time.  3.Refer to lipid clinic:   No  4.Follow-up with:  Primary care provider - Georgann Housekeeper, MD  5.Follow-up labs after discharge:  Changes in lipid therapy were made. Check a lipid panel in 8-12 weeks then annually.      Ulyses Southward, PharmD, BCIDP, AAHIVP, CPP Infectious Disease Pharmacist 11/27/2023 9:51 AM

## 2023-11-27 NOTE — Progress Notes (Signed)
OT Cancellation Note  Patient Details Name: Tyrone [name]. MRN: [id number] DOB: June 12, 1956   Cancelled Treatment:    Reason Eval/Treat Not Completed: Active bedrest order. Orders state active bedrest for another 12 hours 20 minutes--will need to clarify with MD/PA about working with patient.  Lindon Romp OT Acute Rehabilitation Services Office [phone]    Evette Georges 11/27/2023, 8:12 AM

## 2023-11-27 NOTE — Progress Notes (Addendum)
[facility] [place]  Subjective  - doing OK over all other than pain at incisions he states he has numbness in the toes   Objective (!) 149/65 98 98.5 F (36.9 C) (Oral) 20 98%  Intake/Output Summary (Last 24 hours) at 11/27/2023 0708 Last data filed at 11/27/2023 0000 Gross per 24 hour  Intake 3418.93 ml  Output 500 ml  Net 2918.93 ml    AT doppler signal right LE Incisions healing well without signs of infection, no erythema or drainage   Assessment/Planning: POD #1  Procedure: 1.  Redo exposure of right common femoral artery greater than 30 days 2.  Harvest of right leg great saphenous vein 3.  Right common femoral artery to anterior tibial bypass with ipsilateral nonreversed great saphenous vein  Urine OP 200 ml last 24 Cr 1.28 will observe HGB stable 11.3 PT/OT pending mobility Stable post op disposition   Mosetta Pigeon 11/27/2023 7:08 AM --  Laboratory Lab Results: Recent Labs    11/27/23 0435  WBC 9.4  HGB 11.3*  HCT 32.7*  PLT 231   BMET Recent Labs    11/27/23 0435  NA 137  K 4.9  CL 107  CO2 22  GLUCOSE 140*  BUN 20  CREATININE 1.28*  CALCIUM 7.7*    COAG Lab Results  Component Value Date   INR 1.0 11/20/2023   INR 1.0 03/06/2022   INR 0.9 10/24/2021   No results found for: "PTT"  I have seen and evaluated the patient. I agree with the PA note as documented above.  Postop day 1 status post harvest right leg great saphenous vein with a right common femoral to anterior tibial bypass for CLI with rest pain.  All of his incisions look okay this morning.  He has a palpable AT/DP pulse in the right foot and a palpable pulse in the bypass graft.  Pain is an issue.  Out of bed and mobilize today.  Hemoglobin 11.3.  Creatinine 1.28 and will continue gentle hydration.  Aspirin statin for risk reduction.  Out of bed and mobilize.  Heparin for DVT prophylaxis.  Cephus Shelling, MD [facility] [place] Office: [phone]

## 2023-11-27 NOTE — Plan of Care (Signed)
  Problem: Pain Managment: Goal: General experience of comfort will improve and/or be controlled Outcome: Progressing

## 2023-11-28 LAB — BASIC METABOLIC PANEL
Anion gap: 11 (ref 5–15)
BUN: 18 mg/dL (ref 8–23)
CO2: 22 mmol/L (ref 22–32)
Calcium: 8.1 mg/dL — ABNORMAL LOW (ref 8.9–10.3)
Chloride: 101 mmol/L (ref 98–111)
Creatinine, Ser: 0.99 mg/dL (ref 0.61–1.24)
GFR, Estimated: >60 mL/min (ref >60)
Glucose, Bld: 145 mg/dL — ABNORMAL HIGH (ref 70–99)
Potassium: 4.6 mmol/L (ref 3.5–5.1)
Sodium: 134 mmol/L — ABNORMAL LOW (ref 135–145)

## 2023-11-28 MED ORDER — PROMETHAZINE HCL 25 MG RE SUPP: 25.0000 mg | Freq: Four times a day (QID) | RECTAL | Status: DC | PRN

## 2023-11-28 MED ORDER — SODIUM CHLORIDE 0.9 % IV SOLN
12.5000 mg | Freq: Four times a day (QID) | INTRAVENOUS | Status: DC | PRN
  Administered 2023-11-28: 12.5 mg via INTRAVENOUS
  Filled 2023-11-28: qty 12.5

## 2023-11-28 MED ORDER — PROMETHAZINE HCL 25 MG PO TABS: 25.0000 mg | ORAL_TABLET | Freq: Four times a day (QID) | ORAL | Status: DC | PRN

## 2023-11-28 NOTE — Care Management Important Message (Signed)
Important Message  Patient Details  Name: Tyrone [name]. MRN: [id number] Date of Birth: 1956-07-18   Important Message Given:  Yes - Medicare IM     Renie Ora 11/28/2023, 11:06 AM

## 2023-11-28 NOTE — Progress Notes (Addendum)
[facility]  Subjective  - Walked some yesterday, pain   Objective (!) 169/88 100 97.9 F (36.6 C) (Oral) 19 97%  Intake/Output Summary (Last 24 hours) at 11/28/2023 1610 Last data filed at 11/28/2023 0500 Gross per 24 hour  Intake --  Output 600 ml  Net -600 ml    Right LE palpable DP/AT Leg incision healing well, groin soft Lungs non labored breathing General no acute distress  Assessment/Planning: POD # 2 Procedure: 1.  Redo exposure of right common femoral artery greater than 30 days 2.  Harvest of right leg great saphenous vein 3.  Right common femoral artery to anterior tibial bypass with ipsilateral nonreversed great saphenous vein  Improved inflow with palpable DP pulse on the right Pending am labs Encouraged mobility working with PT/OT Pain control    Mosetta Pigeon 11/28/2023 7:04 AM --  Laboratory Lab Results: Recent Labs    11/27/23 0435  WBC 9.4  HGB 11.3*  HCT 32.7*  PLT 231   BMET Recent Labs    11/27/23 0435  NA 137  K 4.9  CL 107  CO2 22  GLUCOSE 140*  BUN 20  CREATININE 1.28*  CALCIUM 7.7*    COAG Lab Results  Component Value Date   INR 1.0 11/20/2023   INR 1.0 03/06/2022   INR 0.9 10/24/2021   No results found for: "PTT"  I have seen and evaluated the patient. I agree with the PA note as documented above. Postop day 2 status post harvest right leg great saphenous vein with a right common femoral to anterior tibial bypass for CLI with rest pain.  All of his incisions again look good this morning.  Dry dressing right groin.  He has a palpable AT/DP pulse in the right foot and a palpable pulse in the bypass graft.  Pain is an ongoing issue but improving.  Out of bed and mobilize today.  PT/OT.  Aspirin statin for risk reduction.  Heparin for DVT prophylaxis.   Cephus Shelling, MD [facility] [place] Office: [phone]

## 2023-11-28 NOTE — Plan of Care (Signed)
  Problem: Education: Goal: Knowledge of General Education information will improve Description: Including pain rating scale, medication(s)/side effects and non-pharmacologic comfort measures Outcome: Progressing   Problem: Clinical Measurements: Goal: Ability to maintain clinical measurements within normal limits will improve Outcome: Progressing Goal: Will remain free from infection Outcome: Progressing Goal: Diagnostic test results will improve Outcome: Progressing Goal: Respiratory complications will improve Outcome: Progressing Goal: Cardiovascular complication will be avoided Outcome: Progressing   Problem: Activity: Goal: Risk for activity intolerance will decrease Outcome: Progressing   Problem: Coping: Goal: Level of anxiety will decrease Outcome: Progressing   Problem: Pain Managment: Goal: General experience of comfort will improve and/or be controlled Outcome: Progressing   Problem: Safety: Goal: Ability to remain free from injury will improve Outcome: Progressing   Problem: Skin Integrity: Goal: Risk for impaired skin integrity will decrease Outcome: Progressing   Problem: Activity: Goal: Ability to tolerate increased activity will improve Outcome: Progressing   Problem: Skin Integrity: Goal: Demonstration of wound healing without infection will improve Outcome: Progressing

## 2023-11-28 NOTE — Progress Notes (Signed)
Physical Therapy Treatment Patient Details Name: Tyrone [name]. MRN: [id number] DOB: Oct 02, 1956 Today's Date: 11/28/2023   History of Present Illness 68 yo male admitted 2/12 for Rt common femoral artery to anterior tibial bypass due to occluded bypass. PMhx: COPD, HTN, prostate CA, PAD    PT Comments  Pt in bed upon arrival and agreeable to PT session. Worked on transfers and gait training in today's session. Pt continues to have significant pain in RLE with mobility and weight bearing. Pt was able to perform bed mobility with CGA and increased time. After a seated rest break, pt was able to stand with MinA and RW. Pt ambulate ~50 ft with CGA and RW with decreased WB on RLE. Pt's HR varied from 120-129 while ambulating with two standing rest breaks to decrease HR, RN notified. Pt was fatigued after ambulating and deferred further exercises. Pt is progressing well towards goals. Acute PT to follow.       If plan is discharge home, recommend the following: A little help with walking and/or transfers;A little help with bathing/dressing/bathroom;Assistance with cooking/housework;Assist for transportation;Help with stairs or ramp for entrance   Can travel by private vehicle      Yes  Equipment Recommendations  None recommended by PT       Precautions / Restrictions Precautions Precautions: Fall Precaution/Restrictions Comments: watch HR Restrictions Weight Bearing Restrictions Per Provider Order: No     Mobility  Bed Mobility Overal bed mobility: Needs Assistance Bed Mobility: Supine to Sit, Sit to Supine     Supine to sit: Contact guard, HOB elevated Sit to supine: Contact guard assist, HOB elevated   General bed mobility comments: CGA for safety, pt used rail w/ HOB elevated    Transfers Overall transfer level: Needs assistance Equipment used: Rolling walker (2 wheels) Transfers: Sit to/from Stand Sit to Stand: Min assist       General transfer comment: MinA for boost  up, cues for hand placement with pt preferring to push from RW despite cues.    Ambulation/Gait Ambulation/Gait assistance: Contact guard assist Gait Distance (Feet): 50 Feet Assistive device: Rolling walker (2 wheels) Gait Pattern/deviations: Step-to pattern, Decreased weight shift to right, Decreased stance time - left Gait velocity: decr     General Gait Details: decreased WB on R LE due to pain, limited knee flexion during swing phase. Significantly increased time     Balance Overall balance assessment: No apparent balance deficits (not formally assessed)          Communication Communication Communication: No apparent difficulties  Cognition Arousal: Alert Behavior During Therapy: WFL for tasks assessed/performed   PT - Cognitive impairments: No apparent impairments    Following commands: Intact      Cueing Cueing Techniques: Verbal cues     General Comments General comments (skin integrity, edema, etc.): HR ranged from 120-129 BPM while ambulating. SpO2 >92% on RA. RN notified      Pertinent Vitals/Pain Pain Assessment Pain Assessment: Faces Faces Pain Scale: Hurts even more Pain Location: RLE Pain Descriptors / Indicators: Aching, Discomfort Pain Intervention(s): Limited activity within patient's tolerance, Monitored during session, Repositioned, Patient requesting pain meds-RN notified     PT Goals (current goals can now be found in the care plan section) Acute Rehab PT Goals PT Goal Formulation: With patient Time For Goal Achievement: 12/11/23 Potential to Achieve Goals: Good Progress towards PT goals: Progressing toward goals    Frequency    Min 1X/week       AM-PAC  PT "6 Clicks" Mobility   Outcome Measure  Help needed turning from your back to your side while in a flat bed without using bedrails?: A Little Help needed moving from lying on your back to sitting on the side of a flat bed without using bedrails?: A Little Help needed moving to  and from a bed to a chair (including a wheelchair)?: A Little Help needed standing up from a chair using your arms (e.g., wheelchair or bedside chair)?: A Little Help needed to walk in hospital room?: A Little Help needed climbing 3-5 steps with a railing? : A Lot 6 Click Score: 17    End of Session Equipment Utilized During Treatment: Gait belt Activity Tolerance: Patient tolerated treatment well Patient left: in bed;with call bell/phone within reach;with bed alarm set Nurse Communication: Mobility status;Other (comment);Patient requests pain meds (HR) PT Visit Diagnosis: Other abnormalities of gait and mobility (R26.89);Difficulty in walking, not elsewhere classified (R26.2);Pain Pain - Right/Left: Right Pain - part of body: Leg     Time: 6045-4098 PT Time Calculation (min) (ACUTE ONLY): 24 min  Charges:    $Gait Training: 8-22 mins $Therapeutic Activity: 8-22 mins PT General Charges $$ ACUTE PT VISIT: 1 Visit                     [name], PT, DPT Secure Chat Preferred  Rehab Office [phone]    [name] 11/28/2023, 3:55 PM

## 2023-11-29 NOTE — Plan of Care (Signed)

## 2023-11-29 NOTE — Progress Notes (Addendum)
 [facility]  Subjective  - Pain issues with movement   Objective (!) 157/71 (!) 106 98.3 F (36.8 C) (Oral) 19 95%  Intake/Output Summary (Last 24 hours) at 11/29/2023 0857 Last data filed at 11/29/2023 4098 Gross per 24 hour  Intake 240 ml  Output 500 ml  Net -260 ml   Right LE palpable DP/AT Leg incision healing well, groin soft Right later incision with bloody drainage on old guaze no active drainage new dressing applied with light ace Lungs non labored breathing General no acute distress   Assessment/Planning: POD # 3 1.  Redo exposure of right common femoral artery greater than 30 days 2.  Harvest of right leg great saphenous vein 3.  Right common femoral artery to anterior tibial bypass with ipsilateral nonreversed great saphenous vein  Slowly improving mobility with significant pain Improved inflow with palpable pedal pulses Cont mobility Working on pain control  Labs stable   Mosetta Pigeon 11/29/2023 8:57 AM --  Laboratory Lab Results: Recent Labs    11/27/23 0435  WBC 9.4  HGB 11.3*  HCT 32.7*  PLT 231   BMET Recent Labs    11/27/23 0435 11/28/23 0707  NA 137 134*  K 4.9 4.6  CL 107 101  CO2 22 22  GLUCOSE 140* 145*  BUN 20 18  CREATININE 1.28* 0.99  CALCIUM 7.7* 8.1*    COAG Lab Results  Component Value Date   INR 1.0 11/20/2023   INR 1.0 03/06/2022   INR 0.9 10/24/2021   No results found for: "PTT"  VASCULAR STAFF ADDENDUM: I have independently interviewed and examined the patient. I agree with the above.  Pain reasonably well-controlled Multiphasic signal in DP and bypass. Continue PT OT  Daria Pastures MD [facility] Phone Number: [phone] 11/29/2023 9:57 AM

## 2023-11-30 MED ORDER — OXYCODONE HCL 5 MG PO TABS
5.0000 mg | ORAL_TABLET | ORAL | Status: DC
  Administered 2023-11-30: 10 mg via ORAL
  Filled 2023-11-30: qty 2

## 2023-11-30 MED ORDER — OXYCODONE HCL 5 MG PO TABS
10.0000 mg | ORAL_TABLET | ORAL | Status: DC
  Administered 2023-11-30 – 2023-12-02 (×11): 10 mg via ORAL
  Filled 2023-11-30 (×11): qty 2

## 2023-11-30 NOTE — Progress Notes (Addendum)
 [facility] [place]  Subjective  - Pain has increased over night.  RN thinks he held off the PO pain medication and now we are trying to cath up   Objective (!) 154/77 77 98.6 F (37 C) (Oral) 16 90%  Intake/Output Summary (Last 24 hours) at 11/30/2023 1610 Last data filed at 11/30/2023 0300 Gross per 24 hour  Intake 650 ml  Output 600 ml  Net 50 ml   Right LE palpable DP/AT Leg incision healing well, groin soft Right later incision with bloody drainage on old guaze no active drainage new dressing applied with light ace No evidence of increased edema, good skin lines Lungs non labored breathing General no acute distress   Assessment/Planning: POD # 4 1.  Redo exposure of right common femoral artery greater than 30 days 2.  Harvest of right leg great saphenous vein 3.  Right common femoral artery to anterior tibial bypass with ipsilateral nonreversed great saphenous vein  Improved inflow with maintained doppler signals, motor and sensation intact with minimal edema I will schedule 2 percocet q4 to try and get his pain under control with IV for breakthrough pain.  Encouraged continued mobility   Tyrone [name] 11/30/2023 8:19 AM --  Laboratory Lab Results: No results for input(s): "WBC", "HGB", "HCT", "PLT" in the last 72 hours. BMET Recent Labs    11/28/23 0707  NA 134*  K 4.6  CL 101  CO2 22  GLUCOSE 145*  BUN 18  CREATININE 0.99  CALCIUM 8.1*    COAG Lab Results  Component Value Date   INR 1.0 11/20/2023   INR 1.0 03/06/2022   INR 0.9 10/24/2021   No results found for: "PTT"  VASCULAR STAFF ADDENDUM: I have independently interviewed and examined the patient. I agree with the above.  Incisions clean dry without evidence of hematoma.  Multiphasic DP PT. Working on pain control, plan for discharge early next week  Daria Pastures MD [facility] Phone Number: [phone] 11/30/2023 8:54 AM

## 2023-12-01 MED ORDER — KETOROLAC TROMETHAMINE 15 MG/ML IJ SOLN
15.0000 mg | Freq: Four times a day (QID) | INTRAMUSCULAR | Status: DC
  Administered 2023-12-01 – 2023-12-02 (×5): 15 mg via INTRAVENOUS
  Filled 2023-12-01 (×5): qty 1

## 2023-12-01 NOTE — Progress Notes (Signed)
 Transition of Care [facility]) - Inpatient Brief Assessment   Patient Details  Name: Tyrone [name]. MRN: [id number] Date of Birth: [birth year]  [facility] CM/SW Contact:    Ronny Bacon, RN Phone Number: 12/01/2023, 1:58 PM   Clinical Narrative:  Patient from home, had Fem Pop done. Awaiting pain control. Per last in house PT note, pt declined in house PT services. Feels he is able to manage on his own. During progression rounds discussed possible discharge next week.   Transition of Care Asessment: Insurance and Status: (P) Insurance coverage has been reviewed Patient has primary care physician: (P) Yes Home environment has been reviewed: (P) house Prior level of function:: (P) independent Prior/Current Home Services: (P) No current home services Social Drivers of Health Review: (P) SDOH reviewed no interventions necessary Readmission risk has been reviewed: (P) Yes Transition of care needs: (P) no transition of care needs at this time

## 2023-12-01 NOTE — Progress Notes (Addendum)
 [facility] [place]  Subjective  - pain issues, ambulating.  "Toes don't move as well on the right LE as the left"   Objective (!) 162/64 90 98 F (36.7 C) (Oral) 19 100%  Intake/Output Summary (Last 24 hours) at 12/01/2023 0758 Last data filed at 12/01/2023 0442 Gross per 24 hour  Intake 240 ml  Output 1000 ml  Net -760 ml   Right LE palpable DP/AT, palpable bypass pulse Leg incision healing well, groin soft Right later incision with bloody drainage on old guaze no active drainage new dressing applied with light ace No evidence of increased edema, good skin lines Lungs non labored breathing General no acute distress   Assessment/Planning: POD # 5 1.  Redo exposure of right common femoral artery greater than 30 days 2.  Harvest of right leg great saphenous vein 3.  Right common femoral artery to anterior tibial bypass with ipsilateral nonreversed great saphenous vein  No change in exam, pain issues continued.  I have added Toradol 15 q6 for 2 days.   Continue mobility PRN dry dressing to the lower leg incisions Cr baseline WNL 0.99 Possible discharge in the next few days   Mosetta Pigeon 12/01/2023 7:58 AM --  Laboratory Lab Results: No results for input(s): "WBC", "HGB", "HCT", "PLT" in the last 72 hours. BMET No results for input(s): "NA", "K", "CL", "CO2", "GLUCOSE", "BUN", "CREATININE", "CALCIUM" in the last 72 hours.  COAG Lab Results  Component Value Date   INR 1.0 11/20/2023   INR 1.0 03/06/2022   INR 0.9 10/24/2021   No results found for: "PTT"  I have seen and evaluated the patient. I agree with the PA note as documented above.  Postop day 5 status post redo right lower extremity bypass with common femoral to anterior tibial with great saphenous vein.  Still has a palpable pulse in the bypass graft.  Palpable DP pulse in the right foot.  Pain and mobility continue to be an issue.  Will add Toradol 15 mg every 6 for 2  days.  Appreciate PT working with him.  Incisions otherwise look great.  Cephus Shelling, MD [facility] [place] Office: [phone]

## 2023-12-01 NOTE — Plan of Care (Signed)

## 2023-12-01 NOTE — Progress Notes (Signed)
 Physical Therapy Discharge Patient Details Name: Tyrone [name]. MRN: [id number] DOB: 05/17/1956 Today's Date: 12/01/2023 Time:  -     Patient discharged from PT services secondary to  Pt reports he has been up walking and doesn't require any further PT assistance since he has multiple similar surgeries. Declined need to practice stairs as well.  .  Please see latest therapy progress note for current level of functioning and progress toward goals.    Progress and discharge plan discussed with patient and/or caregiver: Patient/Caregiver agrees with plan  GP     Angelina Ok [facility] 12/01/2023, 1:03 PM  Skip Mayer PT Acute Rehabilitation Services Office [phone]

## 2023-12-01 NOTE — Progress Notes (Signed)
 Mobility Specialist Progress Note:   12/01/23 1052  Mobility  Activity Ambulated with assistance in hallway  Level of Assistance Standby assist, set-up cues, supervision of patient - no hands on  Assistive Device Front wheel walker  Distance Ambulated (ft) 150 ft  Activity Response Tolerated well  Mobility Referral Yes  Mobility visit 1 Mobility  Mobility Specialist Start Time (ACUTE ONLY) 1045  Mobility Specialist Stop Time (ACUTE ONLY) 1050  Mobility Specialist Time Calculation (min) (ACUTE ONLY) 5 min   Pt received ambulating to door in room, pt unplugged tele and was received w/o Lynn. Ambulated in hallway, with RW, SV for safety. SpO2 92% on RA. Returned pt to room, HR 102 bpm. Left with all needs met, call bell in reach.   Feliciana Rossetti Mobility Specialist Please contact via Special educational needs teacher or  Rehab office at [phone]

## 2023-12-02 ENCOUNTER — Other Ambulatory Visit (HOSPITAL_COMMUNITY): Payer: Self-pay

## 2023-12-02 MED ORDER — OXYCODONE HCL 10 MG PO TABS
10.0000 mg | ORAL_TABLET | Freq: Four times a day (QID) | ORAL | 0 refills | Status: DC | PRN
Start: 2023-12-02 — End: 2023-12-23
  Filled 2023-12-02: qty 30, 7d supply, fill #0

## 2023-12-02 MED ORDER — KETOROLAC TROMETHAMINE 10 MG PO TABS
10.0000 mg | ORAL_TABLET | Freq: Four times a day (QID) | ORAL | 0 refills | Status: DC
  Filled 2023-12-02: qty 8, 2d supply, fill #0

## 2023-12-02 MED ORDER — ATORVASTATIN CALCIUM 80 MG PO TABS
80.0000 mg | ORAL_TABLET | Freq: Every day | ORAL | 3 refills | Status: AC
  Filled 2023-12-02: qty 90, 90d supply, fill #0

## 2023-12-02 NOTE — Progress Notes (Signed)
 [facility] [place]  Subjective  -pain much improved with Toradol.  Declined physical therapy and walked in the hall.  States he is ready to go home.   Objective 133/65 83 97.6 F (36.4 C) (Oral) 19 97%  Intake/Output Summary (Last 24 hours) at 12/02/2023 0649 Last data filed at 12/02/2023 0000 Gross per 24 hour  Intake 500 ml  Output 400 ml  Net 100 ml    Right groin and leg incisions clean dry and intact Right DP palpable  Laboratory Lab Results: No results for input(s): "WBC", "HGB", "HCT", "PLT" in the last 72 hours. BMET No results for input(s): "NA", "K", "CL", "CO2", "GLUCOSE", "BUN", "CREATININE", "CALCIUM" in the last 72 hours.  COAG Lab Results  Component Value Date   INR 1.0 11/20/2023   INR 1.0 03/06/2022   INR 0.9 10/24/2021   No results found for: "PTT"  Assessment/Planning:  Postop day 6 status post redo right lower extremity bypass with common femoral to anterior tibial using ipsilateral great saphenous vein for CLI with rest pain.  Has a palpable DP pulse in the right foot.  All of his incisions look good.  Pain was an issue yesterday and added Toradol which has made a significant improvement.  States he is ready to go home.  Declined physical therapy yesterday and walked in the hall.  Discussed discharge with follow-up in 2 to 3 weeks for incision checks.  Will prescribe oxycodone plus some p.o. Toradol.  He is on aspirin statin for risk reduction.  Cephus Shelling 12/02/2023 6:49 AM --

## 2023-12-02 NOTE — TOC Transition Note (Signed)
 Transition of Care [facility] - Discharge Note Donn Pierini RN, BSN Transitions of Care Unit 4E- RN Case Manager See Treatment Team for direct phone #   Patient Details  Name: Tyrone [name]. MRN: [id number] Date of Birth: 11/01/1955  Transition of Care [facility]) CM/SW Contact:  Darrold Span, RN Phone Number: 12/02/2023, 10:19 AM   Clinical Narrative:    Pt stable for transition home today, CM notified by [facility] liaison that pt has [facility] office referral for [facility] needs- note recommendations for HHPT. Liaison placed call to pt to follow up- per Aggie Cosier [facility] liaison- pt declined HHPT at this time- states that "he has been through this before and thinks he will be fine"  [facility] will not initiate HH protocol.   Pt has no other needs noted, family to transport home.    Final next level of care: Home/Self Care Barriers to Discharge: No Barriers Identified   Patient Goals and CMS Choice Patient states their goals for this hospitalization and ongoing recovery are:: return home   Choice offered to / list presented to : NA      Discharge Placement               Home        Discharge Plan and Services Additional resources added to the After Visit Summary for     Discharge Planning Services: CM Consult Post Acute Care Choice: Home Health          DME Arranged: N/A DME Agency: NA       HH Arranged: PT HH Agency: [facility] Date HH Agency Contacted: 12/02/23   Representative spoke with at [facility] Agency: Aggie Cosier  Social Drivers of Health (SDOH) Interventions SDOH Screenings   Food Insecurity: No Food Insecurity (11/27/2023)  Housing: Low Risk  (11/27/2023)  Transportation Needs: No Transportation Needs (11/27/2023)  Utilities: Not At Risk (11/27/2023)  Depression (PHQ2-9): Low Risk  (11/05/2021)  Social Connections: Moderately Isolated (11/27/2023)  Tobacco Use: Medium Risk (11/26/2023)     Readmission Risk Interventions    12/02/2023    10:19 AM  Readmission Risk Prevention Plan  Post Dischage Appt Complete  Medication Screening Complete  Transportation Screening Complete

## 2023-12-02 NOTE — Discharge Instructions (Signed)
 [facility]  Discharge instructions  Lower Extremity Bypass Surgery  Please refer to the following instruction for your post-procedure care. Your surgeon or physician assistant will discuss any changes with you.  Activity  You are encouraged to walk as much as you can. You can slowly return to normal activities during the month after your surgery. Avoid strenuous activity and heavy lifting until your doctor tells you it's OK. Avoid activities such as vacuuming or swinging a golf club. Do not drive until your doctor give the OK and you are no longer taking prescription pain medications. It is also normal to have difficulty with sleep habits, eating and bowel movement after surgery. These will go away with time.  Bathing/Showering  You may shower after you go home. Do not soak in a bathtub, hot tub, or swim until the incision heals completely.  Incision Care  Clean your incision with mild soap and water. Shower every day. Pat the area dry with a clean towel. You do not need a bandage unless otherwise instructed. Do not apply any ointments or creams to your incision. If you have open wounds you will be instructed how to care for them or a visiting nurse may be arranged for you. If you have staples or sutures along your incision they will be removed at your post-op appointment. You may have skin glue on your incision. Do not peel it off. It will come off on its own in about one week. If you have a great deal of moisture in your groin, use a gauze help keep this area dry.  Diet  Resume your normal diet. There are no special food restrictions following this procedure. A low fat/ low cholesterol diet is recommended for all patients with vascular disease. In order to heal from your surgery, it is CRITICAL to get adequate nutrition. Your body requires vitamins, minerals, and protein. Vegetables are the best source of vitamins and minerals. Vegetables also provide the  perfect balance of protein. Processed food has little nutritional value, so try to avoid this.  Medications  Resume taking all your medications unless your doctor or nurse practitioner tells you not to. If your incision is causing pain, you may take over-the-counter pain relievers such as acetaminophen (Tylenol). If you were prescribed a stronger pain medication, please aware these medication can cause nausea and constipation. Prevent nausea by taking the medication with a snack or meal. Avoid constipation by drinking plenty of fluids and eating foods with high amount of fiber, such as fruits, vegetables, and grains. Take Colase 100 mg (an over-the-counter stool softener) twice a day as needed for constipation. Do not take Tylenol if you are taking prescription pain medications.  Follow Up  Our office will schedule a follow up appointment 2-3 weeks following discharge.  Please call us immediately for any of the following conditions  Severe or worsening pain in your legs or feet while at rest or while walking Increase pain, redness, warmth, or drainage (pus) from your incision site(s) Fever of 101 degree or higher The swelling in your leg with the bypass suddenly worsens and becomes more painful than when you were in the hospital If you have been instructed to feel your graft pulse then you should do so every day. If you can no longer feel this pulse, call the office immediately. Not all patients are given this instruction.  Leg swelling is common after leg bypass surgery.  The swelling should improve over a few months  following surgery. To improve the swelling, you may elevate your legs above the level of your heart while you are sitting or resting. Your surgeon or physician assistant may ask you to apply an ACE wrap or wear compression (TED) stockings to help to reduce swelling.  Reduce your risk of vascular disease  Stop smoking. If you would like help call [facility] at 1-800-QUIT-NOW  ((470) 654-9347) or [place] at (807)887-0074.  Manage your cholesterol Maintain a desired weight Control your diabetes weight Control your diabetes Keep your blood pressure down  If you have any questions, please call the office at [phone]

## 2023-12-03 LAB — TYPE AND SCREEN
ABO/RH(D): O POS
Antibody Screen: POSITIVE
Unit division: 0
Unit division: 0

## 2023-12-03 LAB — BPAM RBC
Blood Product Expiration Date: 202503092359
Blood Product Expiration Date: 202503092359
Unit Type and Rh: 5100
Unit Type and Rh: 5100

## 2023-12-03 NOTE — Discharge Summary (Signed)
 [facility] Discharge Summary   Patient ID:  Tyrone [name]. MRN: [id number] DOB/AGE: Nov 30, 1955 68 y.o.  Admit date: 11/26/2023 Discharge date: 12/02/23 Date of Surgery: 11/26/2023 Surgeon: Surgeon(s): Cephus Shelling, MD Victorino Sparrow, MD  Admission Diagnosis: Critical limb ischemia of right lower extremity [facility]) [I70.221]  Discharge Diagnoses:  Critical limb ischemia of right lower extremity (HCC) [I70.221]  Secondary Diagnoses: Past Medical History:  Diagnosis Date   Arthritis    hands, knees, back   COPD (chronic obstructive pulmonary disease) (HCC)    Depression    Diskitis 02/28/2021   Hardware complicating wound infection (HCC) 02/28/2021   Headache    migraines   Hypertension    Legionella pneumonia (HCC) 02/28/2021   Neuromuscular disorder (HCC)    hands   Peripheral vascular disease (HCC)    Pre-diabetes    Prostate cancer (HCC)    Smoker 11/05/2021   Vaccine counseling 11/05/2021    Procedure(s): COMMON FEMORAL TO ANTERIOR TIBIAL ARTERY BYPASS USING GREATER SAPHENOUS VEIN VEIN HARVEST OF RIGHT GREATER SAPHANEOUS VEIN REDO RIGHT GROIN EXPOSURE  Discharged Condition: stable  HPI: 68 year old male that has previously undergone a right common femoral endarterectomy with right common femoral to below-knee popliteal PTFE bypass.  Bypass is now known to be occluded.  He has rest pain.  He cannot tolerate his level of ischemia.  He presents for tibial bypass    Hospital Course:  Tyrone [name]. is a 68 y.o. male is with occluded right LE bypass.   S/P  Procedure(s): COMMON FEMORAL TO ANTERIOR TIBIAL ARTERY BYPASS USING GREATER SAPHENOUS VEIN VEIN HARVEST OF RIGHT GREATER SAPHANEOUS VEIN REDO RIGHT GROIN EXPOSURE He had significant pain issues with mobility.  We ultimately he was put on Toradol for 2 days at discharge to assist with the pain.  Right LE palpable DP/AT, palpable bypass pulse.  The incisions were healing well.   He refused HH and had no equipment needs. Lateral LE incision had minimal drainage.  Dry dressing as needed PRN.  Elevation when at rest.  F/U arranged with our office.  Improved in flow post op.     Significant Diagnostic Studies: CBC Lab Results  Component Value Date   WBC 9.4 11/27/2023   HGB 11.3 (L) 11/27/2023   HCT 32.7 (L) 11/27/2023   MCV 91.1 11/27/2023   PLT 231 11/27/2023    BMET    Component Value Date/Time   NA 134 (L) 11/28/2023 0707   K 4.6 11/28/2023 0707   CL 101 11/28/2023 0707   CO2 22 11/28/2023 0707   GLUCOSE 145 (H) 11/28/2023 0707   BUN 18 11/28/2023 0707   CREATININE 0.99 11/28/2023 0707   CREATININE 0.96 11/05/2021 1533   CALCIUM 8.1 (L) 11/28/2023 0707   GFRNONAA >60 11/28/2023 0707   GFRNONAA 65 02/28/2021 0916   GFRAA 76 02/28/2021 0916   COAG Lab Results  Component Value Date   INR 1.0 11/20/2023   INR 1.0 03/06/2022   INR 0.9 10/24/2021     Disposition:  Discharge to :Home Discharge Instructions     Call MD for:  redness, tenderness, or signs of infection (pain, swelling, bleeding, redness, odor or green/yellow discharge around incision site)   Complete by: As directed    Call MD for:  redness, tenderness, or signs of infection (pain, swelling, bleeding, redness, odor or green/yellow discharge around incision site)   Complete by: As directed    Call MD for:  severe or increased pain,  loss or decreased feeling  in affected limb(s)   Complete by: As directed    Call MD for:  severe or increased pain, loss or decreased feeling  in affected limb(s)   Complete by: As directed    Call MD for:  temperature >100.5   Complete by: As directed    Call MD for:  temperature >100.5   Complete by: As directed    Resume previous diet   Complete by: As directed    Resume previous diet   Complete by: As directed       Allergies as of 12/02/2023       Reactions   Neurontin [gabapentin] Itching, Other (See Comments)   Confusion and sedation.    Celexa [citalopram Hydrobromide]    restless leg/insomnia worse   Topamax [topiramate]    numbness in hand        Medication List     TAKE these medications    albuterol 108 (90 Base) MCG/ACT inhaler Commonly known as: VENTOLIN HFA Inhale 1-2 puffs into the lungs every 6 (six) hours as needed for wheezing or shortness of breath.   aspirin 325 MG tablet Take 325 mg by mouth every evening.   atorvastatin 80 MG tablet Commonly known as: LIPITOR Take 1 tablet (80 mg total) by mouth daily. What changed:  medication strength how much to take   buPROPion 150 MG 24 hr tablet Commonly known as: WELLBUTRIN XL Take 150 mg by mouth daily.   ezetimibe 10 MG tablet Commonly known as: ZETIA Take 1 tablet (10 mg total) by mouth daily.   ipratropium-albuterol 0.5-2.5 (3) MG/3ML Soln Commonly known as: DUONEB Take 3 mLs by nebulization 2 (two) times daily. What changed:  when to take this reasons to take this   ketorolac 10 MG tablet Commonly known as: TORADOL Take 1 tablet (10 mg total) by mouth every 6 (six) hours.   multivitamin with minerals Tabs tablet Take 1 tablet by mouth every evening. Centrum Silver   Oxycodone HCl 10 MG Tabs Take 1 tablet (10 mg total) by mouth every 6 (six) hours as needed for severe pain (pain score 7-10).   oxymetazoline 0.05 % nasal spray Commonly known as: AFRIN Place 1 spray into both nostrils 2 (two) times daily as needed for congestion.       Verbal and written Discharge instructions given to the patient. Wound care per Discharge AVS  Follow-up Information     Cephus Shelling, MD Follow up in 2 week(s).   Specialty: Vascular Surgery Why: Office will call you to arrange your appt (sent) Contact information: [address] [place] [address] [phone]                 Signed: Mosetta Pigeon 12/03/2023, 10:53 AM

## 2023-12-04 ENCOUNTER — Telehealth: Payer: Self-pay

## 2023-12-04 NOTE — Telephone Encounter (Signed)
 Triage/Advice: -pt had went to his PCP and received message from [place] at [facility] stating pt was having a lot of pain that maybe we could move his appt up.  -reviewed chart, spoke to pt who stated he is getting better slowly but it was expected and he can deal with it.  He reports most of his pain in in the lower leg but his leg is warm.  He has been taking the pain medicine and just finished the Toradol today but has plenty of the other pain medicine.   -he confirms understanding that if his pain or swelling worsens or his leg becomes cool that he will call immediately.

## 2023-12-23 ENCOUNTER — Ambulatory Visit (INDEPENDENT_AMBULATORY_CARE_PROVIDER_SITE_OTHER): Payer: HMO | Admitting: Physician Assistant

## 2023-12-23 VITALS — BP 145/75 | HR 98 | Temp 98.0°F | Resp 18 | Ht 70.0 in | Wt 232.3 lb

## 2023-12-23 DIAGNOSIS — I70221 Atherosclerosis of native arteries of extremities with rest pain, right leg: Secondary | ICD-10-CM

## 2023-12-23 DIAGNOSIS — I739 Peripheral vascular disease, unspecified: Secondary | ICD-10-CM

## 2023-12-23 MED ORDER — OXYCODONE HCL 10 MG PO TABS: 10.0000 mg | ORAL_TABLET | Freq: Four times a day (QID) | ORAL | 0 refills | Status: DC | PRN

## 2023-12-23 NOTE — Progress Notes (Signed)
 POST OPERATIVE OFFICE NOTE    CC:  F/u for surgery  HPI:  This is a 68 y.o. male who is s/p redo right common femoral artery to anterior tibial artery bypass with vein by Dr. Chestine Spore on 11/26/2023 due to critical limb ischemia with rest pain after occluded right leg bypass.  He had an uncomplicated hospital course other than some difficulty controlling pain postoperatively.  He believes the bypass is working well and no longer has rest pain in his right foot.  He however continues to be in moderate to severe pain related to his incisions.  He is on aspirin and statin daily.  He believes his incisions have been healing well.  Allergies  Allergen Reactions   Neurontin [Gabapentin] Itching and Other (See Comments)    Confusion and sedation.   Celexa [Citalopram Hydrobromide]     restless leg/insomnia worse   Topamax [Topiramate]     numbness in hand    Current Outpatient Medications  Medication Sig Dispense Refill   albuterol (VENTOLIN HFA) 108 (90 Base) MCG/ACT inhaler Inhale 1-2 puffs into the lungs every 6 (six) hours as needed for wheezing or shortness of breath.     aspirin 325 MG tablet Take 325 mg by mouth every evening.     atorvastatin (LIPITOR) 80 MG tablet Take 1 tablet (80 mg total) by mouth daily. 90 tablet 3   buPROPion (WELLBUTRIN XL) 150 MG 24 hr tablet Take 150 mg by mouth daily.     ezetimibe (ZETIA) 10 MG tablet Take 1 tablet (10 mg total) by mouth daily. 30 tablet 11   ipratropium-albuterol (DUONEB) 0.5-2.5 (3) MG/3ML SOLN Take 3 mLs by nebulization 2 (two) times daily. (Patient taking differently: Take 3 mLs by nebulization 2 (two) times daily as needed (SOB/Wheeezing).) 360 mL 0   ketorolac (TORADOL) 10 MG tablet Take 1 tablet (10 mg total) by mouth every 6 (six) hours. 8 tablet 0   Multiple Vitamin (MULTIVITAMIN WITH MINERALS) TABS tablet Take 1 tablet by mouth every evening. Centrum Silver     oxymetazoline (AFRIN) 0.05 % nasal spray Place 1 spray into both nostrils 2  (two) times daily as needed for congestion.     Oxycodone HCl 10 MG TABS Take 1 tablet (10 mg total) by mouth every 6 (six) hours as needed. 30 tablet 0   No current facility-administered medications for this visit.     ROS:  See HPI  Physical Exam:  Vitals:   12/23/23 0816  BP: (!) 145/75  Pulse: 98  Resp: 18  Temp: 98 F (36.7 C)  TempSrc: Temporal  SpO2: 95%  Weight: 232 lb 4.8 oz (105.4 kg)  Height: 5\' 10"  (1.778 m)    Incision: Right leg incisions well-healed Extremities: Palpable bypass pulse at the lateral knee with brisk DP signals well into the foot Neuro: A&O  Assessment/Plan:  This is a 68 y.o. male who is s/p: Redo right femoral to anterior tibial artery bypass with ipsilateral vein  Right foot well-perfused with a palpable bypass pulse and brisk DP signals into the foot. All incisions well-healed.  Lateral lower leg incision well-healed.  All sutures were removed and Steri-Strips were applied.  He however continues to be in moderate to severe pain with any movement of the leg.  I refilled his narcotic prescription.  I encouraged him to increase his mobility with walking.  I assured him and his wife that swelling is very normal after redo bypass surgery with vein.  Continue aspirin and statin  daily.  He will return in 3 to 4 weeks with a right leg bypass duplex and ABI.  He knows to call/return office sooner with any questions or concerns.   Tyrone [name], Tyrone [name] [facility] [phone]  Clinic MD:  Chestine Spore

## 2023-12-26 ENCOUNTER — Other Ambulatory Visit: Payer: Self-pay | Admitting: *Deleted

## 2023-12-26 DIAGNOSIS — I739 Peripheral vascular disease, unspecified: Secondary | ICD-10-CM

## 2023-12-26 DIAGNOSIS — I70221 Atherosclerosis of native arteries of extremities with rest pain, right leg: Secondary | ICD-10-CM

## 2024-01-13 ENCOUNTER — Telehealth: Payer: Self-pay

## 2024-01-13 NOTE — Telephone Encounter (Addendum)
 Triage: -pt LM stating he had stitches taken out on his last visit and he found some more that were missed. -returned call to patient and LM to call back to schedule appt -consulted with PA.  Moved up appt & studies.

## 2024-01-14 ENCOUNTER — Other Ambulatory Visit: Payer: Self-pay | Admitting: *Deleted

## 2024-01-14 DIAGNOSIS — I739 Peripheral vascular disease, unspecified: Secondary | ICD-10-CM

## 2024-01-14 DIAGNOSIS — I70221 Atherosclerosis of native arteries of extremities with rest pain, right leg: Secondary | ICD-10-CM

## 2024-01-20 ENCOUNTER — Ambulatory Visit (INDEPENDENT_AMBULATORY_CARE_PROVIDER_SITE_OTHER)
Admission: RE | Admit: 2024-01-20 | Discharge: 2024-01-20 | Disposition: A | Discharge status: Home / Self Care | Source: Ambulatory Visit | Attending: Vascular Surgery | Admitting: Vascular Surgery

## 2024-01-20 ENCOUNTER — Other Ambulatory Visit: Payer: Self-pay

## 2024-01-20 ENCOUNTER — Ambulatory Visit (HOSPITAL_COMMUNITY)
Admission: RE | Admit: 2024-01-20 | Discharge: 2024-01-20 | Disposition: A | Discharge status: Home / Self Care | Source: Ambulatory Visit | Attending: Vascular Surgery | Admitting: Vascular Surgery

## 2024-01-20 ENCOUNTER — Ambulatory Visit (INDEPENDENT_AMBULATORY_CARE_PROVIDER_SITE_OTHER): Admitting: Physician Assistant

## 2024-01-20 VITALS — BP 156/95 | HR 96 | Temp 98.1°F | Resp 18 | Ht 70.0 in | Wt 234.6 lb

## 2024-01-20 DIAGNOSIS — I70221 Atherosclerosis of native arteries of extremities with rest pain, right leg: Secondary | ICD-10-CM | POA: Insufficient documentation

## 2024-01-20 DIAGNOSIS — I739 Peripheral vascular disease, unspecified: Secondary | ICD-10-CM

## 2024-01-20 LAB — VAS US ABI WITH/WO TBI
Left ABI: 0.87
Right ABI: 0.62

## 2024-01-20 MED ORDER — CLOPIDOGREL BISULFATE 75 MG PO TABS: 75.0000 mg | ORAL_TABLET | Freq: Every day | ORAL | 6 refills | Status: DC

## 2024-01-20 NOTE — Progress Notes (Signed)
 Office Note     CC:  follow up Requesting Provider:  Georgann Housekeeper, MD  HPI: Tyrone [name]. is a 68 y.o. (1956/06/29) male who presents for surveillance of right lower extremity bypass.  He underwent redo right common femoral artery to anterior tibial artery bypass with vein by Dr. Chestine Spore on 11/26/2023 due to critical limb ischemia with rest pain after an occluded right leg bypass.  Rest pain has resolved.  The generalized pain in his right leg has drastically improved now that his mobility has improved.  He is on aspirin and statin daily.  He continues to have numbness in his right great toe however this is stable compared to preop.  He believes his incisions are well-healed.   Past Medical History:  Diagnosis Date   Arthritis    hands, knees, back   COPD (chronic obstructive pulmonary disease) (HCC)    Depression    Diskitis 02/28/2021   Hardware complicating wound infection (HCC) 02/28/2021   Headache    migraines   Hypertension    Legionella pneumonia (HCC) 02/28/2021   Neuromuscular disorder (HCC)    hands   Peripheral vascular disease (HCC)    Pre-diabetes    Prostate cancer (HCC)    Smoker 11/05/2021   Vaccine counseling 11/05/2021    Past Surgical History:  Procedure Laterality Date   ABDOMINAL AORTOGRAM W/LOWER EXTREMITY Bilateral 10/18/2021   Procedure: ABDOMINAL AORTOGRAM W/LOWER EXTREMITY;  Surgeon: Cephus Shelling, MD;  Location: [facility];  Service: Cardiovascular;  Laterality: Bilateral;   ABDOMINAL AORTOGRAM W/LOWER EXTREMITY Right 02/28/2022   Procedure: ABDOMINAL AORTOGRAM W/LOWER EXTREMITY;  Surgeon: Cephus Shelling, MD;  Location: [facility];  Service: Cardiovascular;  Laterality: Right;   ABDOMINAL AORTOGRAM W/LOWER EXTREMITY Right 10/30/2023   Procedure: ABDOMINAL AORTOGRAM W/LOWER EXTREMITY;  Surgeon: Cephus Shelling, MD;  Location: [facility];  Service: Cardiovascular;  Laterality: Right;   APPENDECTOMY     BACK  SURGERY  1980"s   L4 to L 5 laminectomy   BYPASS GRAFT POPLITEAL TO TIBIAL Right 11/26/2023   Procedure: COMMON FEMORAL TO ANTERIOR TIBIAL ARTERY BYPASS USING GREATER SAPHENOUS VEIN;  Surgeon: Cephus Shelling, MD;  Location: [facility] OR;  Service: Vascular;  Laterality: Right;   COLONOSCOPY WITH PROPOFOL N/A 02/06/2016   Procedure: COLONOSCOPY WITH PROPOFOL;  Surgeon: Charolett Bumpers, MD;  Location: [facility];  Service: Endoscopy;  Laterality: N/A;   DIAGNOSTIC LAPAROSCOPY     ENDARTERECTOMY FEMORAL Left 10/24/2021   Procedure: ENDARTERECTOMY FEMORAL WITH PROFUNDAPLASTY;  Surgeon: Cephus Shelling, MD;  Location: [facility] OR;  Service: Vascular;  Laterality: Left;   ENDARTERECTOMY FEMORAL Right 03/06/2022   Procedure: RIGHT COMMON FEMORAL ENDARTERECTOMY;  Surgeon: Cephus Shelling, MD;  Location: [facility] OR;  Service: Vascular;  Laterality: Right;   FEMORAL-POPLITEAL BYPASS GRAFT Left 10/24/2021   Procedure: LEFT COMMON FEMORAL- BELOW KNEE POPLITEAL BYPASS;  Surgeon: Cephus Shelling, MD;  Location: [facility] OR;  Service: Vascular;  Laterality: Left;   FEMORAL-POPLITEAL BYPASS GRAFT Right 03/06/2022   Procedure: RIGHT FEMORAL-POPLITEAL BYPASS WITH GORETEC GRAFT;  Surgeon: Cephus Shelling, MD;  Location: [facility] OR;  Service: Vascular;  Laterality: Right;  INSERT ARTERIAL LINE   GROIN EXPOSURE Right 11/26/2023   Procedure: REDO RIGHT GROIN EXPOSURE;  Surgeon: Cephus Shelling, MD;  Location: [facility] OR;  Service: Vascular;  Laterality: Right;   LAPAROSCOPIC APPENDECTOMY N/A 08/13/2018   Procedure: APPENDECTOMY LAPAROSCOPIC;  Surgeon: Ovidio Kin, MD;  Location: [facility];  Service:  General;  Laterality: N/A;   PATCH ANGIOPLASTY Left 10/24/2021   Procedure: PATCH ANGIOPLASTY;  Surgeon: Cephus Shelling, MD;  Location: [facility] OR;  Service: Vascular;  Laterality: Left;   PELVIC LYMPH NODE DISSECTION Bilateral 02/08/2019   Procedure: PELVIC LYMPH NODE DISSECTION;  Surgeon: Crista Elliot, MD;  Location: [facility];  Service: Urology;  Laterality: Bilateral;   ROBOT ASSISTED LAPAROSCOPIC RADICAL PROSTATECTOMY N/A 02/08/2019   Procedure: XI ROBOTIC ASSISTED LAPAROSCOPIC RADICAL PROSTATECTOMY;  Surgeon: Crista Elliot, MD;  Location: [facility];  Service: Urology;  Laterality: N/A;   TRANSFORAMINAL LUMBAR INTERBODY FUSION (TLIF) WITH PEDICLE SCREW FIXATION 1 LEVEL Left 09/12/2020   Procedure: LEFT-SIDED LUMBAR 1 - LUMBAR 2 TRANSFORAMINAL LUMBAR INTERBODY FUSION WITH INSTRUMENTATION AND ALLOGRAFT;  Surgeon: Estill Bamberg, MD;  Location: [facility] OR;  Service: Orthopedics;  Laterality: Left;  3C BED   VEIN HARVEST Left 10/24/2021   Procedure: VEIN HARVEST;  Surgeon: Cephus Shelling, MD;  Location: [facility] OR;  Service: Vascular;  Laterality: Left;   VEIN HARVEST Right 11/26/2023   Procedure: VEIN HARVEST OF RIGHT GREATER SAPHANEOUS VEIN;  Surgeon: Cephus Shelling, MD;  Location: [facility] OR;  Service: Vascular;  Laterality: Right;   WRIST SURGERY Right    x 2     Social History   Socioeconomic History   Marital status: Married    Spouse name: Not on file   Number of children: 6   Years of education: Not on file   Highest education level: Not on file  Occupational History   Occupation: Courtyard  Tobacco Use   Smoking status: Former    Current packs/day: 0.00    Types: Cigarettes    Quit date: 02/02/2022    Years since quitting: 1.9   Smokeless tobacco: Never   Tobacco comments:    Smoked 2 pkgs a day for 51 years  Vaping Use   Vaping status: Never Used  Substance and Sexual Activity   Alcohol use: Not Currently    Comment: occasional   Drug use: No   Sexual activity: Not Currently  Other Topics Concern   Not on file  Social History Narrative   Married for 46 years with 3 sons and 3 daughters. Smokes a ppd.    Social Drivers of Corporate investment banker Strain: Not on file  Food Insecurity: No Food Insecurity (11/27/2023)   Hunger Vital Sign    Worried About Running Out of Food in the  Last Year: Never true    Ran Out of Food in the Last Year: Never true  Transportation Needs: No Transportation Needs (11/27/2023)   PRAPARE - Administrator, Civil Service (Medical): No    Lack of Transportation (Non-Medical): No  Physical Activity: Not on file  Stress: Not on file  Social Connections: Moderately Isolated (11/27/2023)   Social Connection and Isolation Panel [NHANES]    Frequency of Communication with Friends and Family: More than three times a week    Frequency of Social Gatherings with Friends and Family: Once a week    Attends Religious Services: Never    Database administrator or Organizations: No    Attends Banker Meetings: Never    Marital Status: Married  Catering manager Violence: Not At Risk (11/27/2023)   Humiliation, Afraid, Rape, and Kick questionnaire    Fear of Current or Ex-Partner: No    Emotionally Abused: No    Physically Abused: No    Sexually Abused: No  Family History  Problem Relation Age of Onset   Colon cancer Maternal Grandmother    Colon cancer Maternal Great-grandmother    Prostate cancer Neg Hx     Current Outpatient Medications  Medication Sig Dispense Refill   albuterol (VENTOLIN HFA) 108 (90 Base) MCG/ACT inhaler Inhale 1-2 puffs into the lungs every 6 (six) hours as needed for wheezing or shortness of breath.     aspirin 325 MG tablet Take 325 mg by mouth every evening.     atorvastatin (LIPITOR) 80 MG tablet Take 1 tablet (80 mg total) by mouth daily. 90 tablet 3   buPROPion (WELLBUTRIN XL) 150 MG 24 hr tablet Take 150 mg by mouth daily.     clopidogrel (PLAVIX) 75 MG tablet Take 1 tablet (75 mg total) by mouth daily. 30 tablet 6   ezetimibe (ZETIA) 10 MG tablet Take 1 tablet (10 mg total) by mouth daily. 30 tablet 11   ipratropium-albuterol (DUONEB) 0.5-2.5 (3) MG/3ML SOLN Take 3 mLs by nebulization 2 (two) times daily. (Patient taking differently: Take 3 mLs by nebulization 2 (two) times daily as needed  (SOB/Wheeezing).) 360 mL 0   ketorolac (TORADOL) 10 MG tablet Take 1 tablet (10 mg total) by mouth every 6 (six) hours. 8 tablet 0   Multiple Vitamin (MULTIVITAMIN WITH MINERALS) TABS tablet Take 1 tablet by mouth every evening. Centrum Silver     Oxycodone HCl 10 MG TABS Take 1 tablet (10 mg total) by mouth every 6 (six) hours as needed. 30 tablet 0   oxymetazoline (AFRIN) 0.05 % nasal spray Place 1 spray into both nostrils 2 (two) times daily as needed for congestion.     No current facility-administered medications for this visit.    Allergies  Allergen Reactions   Neurontin [Gabapentin] Itching and Other (See Comments)    Confusion and sedation.   Celexa [Citalopram Hydrobromide]     restless leg/insomnia worse   Topamax [Topiramate]     numbness in hand     REVIEW OF SYSTEMS:   [X]  denotes positive finding, [ ]  denotes negative finding Cardiac  Comments:  Chest pain or chest pressure:    Shortness of breath upon exertion:    Short of breath when lying flat:    Irregular heart rhythm:        Vascular    Pain in calf, thigh, or hip brought on by ambulation:    Pain in feet at night that wakes you up from your sleep:     Blood clot in your veins:    Leg swelling:         Pulmonary    Oxygen at home:    Productive cough:     Wheezing:         Neurologic    Sudden weakness in arms or legs:     Sudden numbness in arms or legs:     Sudden onset of difficulty speaking or slurred speech:    Temporary loss of vision in one eye:     Problems with dizziness:         Gastrointestinal    Blood in stool:     Vomited blood:         Genitourinary    Burning when urinating:     Blood in urine:        Psychiatric    Major depression:         Hematologic    Bleeding problems:    Problems with blood clotting too  easily:        Skin    Rashes or ulcers:        Constitutional    Fever or chills:      PHYSICAL EXAMINATION:  Vitals:   01/20/24 1136  BP: (!) 156/95   Pulse: 96  Resp: 18  Temp: 98.1 F (36.7 C)  TempSrc: Temporal  SpO2: 94%  Weight: 234 lb 9.6 oz (106.4 kg)  Height: 5\' 10"  (1.778 m)    General:  WDWN in NAD; vital signs documented above Gait: Not observed HENT: WNL, normocephalic Pulmonary: normal non-labored breathing , without Rales, rhonchi,  wheezing Cardiac: regular HR Abdomen: soft, NT, no masses Skin: without rashes Vascular Exam/Pulses: 1+ palpable right DP pulse Extremities: without ischemic changes, without Gangrene , without cellulitis; without open wounds;  Musculoskeletal: no muscle wasting or atrophy  Neurologic: A&O X 3 Psychiatric:  The pt has Normal affect.   Non-Invasive Vascular Imaging:   Patent right leg bypass graft with 257 cm/s velocity in the mid graft and 500 cm/s velocity at the distal anastomosis    ASSESSMENT/PLAN:: 68 y.o. male here for surveillance of right leg bypass  Right leg well-perfused with a 1+ palpable DP pulse Unfortunately duplex demonstrates significant stenosis of the distal anastomosis. He will be started on Plavix in addition to his aspirin and statin daily Plan will be to proceed with aortogram with right lower extremity runoff with revascularization of the distal anastomosis with Dr. Chestine Spore on 01/29/2024 Case was discussed in detail with the patient and he is agreeable to proceed   Emilie Rutter, PA-C [facility] [phone]  Clinic MD:   Chestine Spore

## 2024-01-29 ENCOUNTER — Other Ambulatory Visit: Payer: Self-pay

## 2024-01-29 ENCOUNTER — Encounter (HOSPITAL_COMMUNITY)
Admission: RE | Disposition: A | Discharge status: Home / Self Care | Payer: Self-pay | Source: Home / Self Care | Attending: Vascular Surgery

## 2024-01-29 ENCOUNTER — Ambulatory Visit (HOSPITAL_COMMUNITY)
Admission: RE | Admit: 2024-01-29 | Discharge: 2024-01-29 | Disposition: A | Discharge status: Home / Self Care | Attending: Vascular Surgery | Admitting: Vascular Surgery

## 2024-01-29 DIAGNOSIS — T82858A Stenosis of vascular prosthetic devices, implants and grafts, initial encounter: Secondary | ICD-10-CM | POA: Insufficient documentation

## 2024-01-29 DIAGNOSIS — Z87891 Personal history of nicotine dependence: Secondary | ICD-10-CM | POA: Insufficient documentation

## 2024-01-29 DIAGNOSIS — I70221 Atherosclerosis of native arteries of extremities with rest pain, right leg: Secondary | ICD-10-CM | POA: Diagnosis not present

## 2024-01-29 DIAGNOSIS — Y832 Surgical operation with anastomosis, bypass or graft as the cause of abnormal reaction of the patient, or of later complication, without mention of misadventure at the time of the procedure: Secondary | ICD-10-CM | POA: Diagnosis not present

## 2024-01-29 HISTORY — PX: LOWER EXTREMITY ANGIOGRAPHY: CATH118251

## 2024-01-29 HISTORY — PX: ABDOMINAL AORTOGRAM: CATH118222

## 2024-01-29 HISTORY — PX: LOWER EXTREMITY INTERVENTION: CATH118252

## 2024-01-29 LAB — POCT I-STAT, CHEM 8
BUN: 17 mg/dL (ref 8–23)
Calcium, Ion: 1.2 mmol/L (ref 1.15–1.40)
Chloride: 103 mmol/L (ref 98–111)
Creatinine, Ser: 1.1 mg/dL (ref 0.61–1.24)
Glucose, Bld: 121 mg/dL — ABNORMAL HIGH (ref 70–99)
HCT: 44 % (ref 39.0–52.0)
Hemoglobin: 15 g/dL (ref 13.0–17.0)
Potassium: 4.4 mmol/L (ref 3.5–5.1)
Sodium: 139 mmol/L (ref 135–145)
TCO2: 25 mmol/L (ref 22–32)

## 2024-01-29 LAB — POCT ACTIVATED CLOTTING TIME
Activated Clotting Time: 273 s
Activated Clotting Time: 193 s
Activated Clotting Time: 170 s

## 2024-01-29 SURGERY — ABDOMINAL AORTOGRAM
Anesthesia: LOCAL

## 2024-01-29 MED ORDER — SODIUM CHLORIDE 0.9 % IV SOLN: INTRAVENOUS | Status: DC

## 2024-01-29 MED ORDER — OXYCODONE HCL 5 MG PO TABS
ORAL_TABLET | ORAL | Status: AC
  Filled 2024-01-29: qty 2

## 2024-01-29 MED ORDER — MIDAZOLAM HCL 2 MG/2ML IJ SOLN
INTRAMUSCULAR | Status: AC
  Filled 2024-01-29: qty 2

## 2024-01-29 MED ORDER — HYDRALAZINE HCL 20 MG/ML IJ SOLN: 5.0000 mg | INTRAMUSCULAR | Status: DC | PRN

## 2024-01-29 MED ORDER — CLOPIDOGREL BISULFATE 75 MG PO TABS: 75.0000 mg | ORAL_TABLET | Freq: Every day | ORAL | Status: DC

## 2024-01-29 MED ORDER — CLOPIDOGREL BISULFATE 75 MG PO TABS
ORAL_TABLET | ORAL | Status: DC | PRN
  Administered 2024-01-29: 75 mg via ORAL

## 2024-01-29 MED ORDER — HEPARIN SODIUM (PORCINE) 1000 UNIT/ML IJ SOLN
INTRAMUSCULAR | Status: DC | PRN
Start: 2024-01-29 — End: 2024-01-29
  Administered 2024-01-29: 10000 [IU] via INTRAVENOUS

## 2024-01-29 MED ORDER — OXYCODONE HCL 5 MG PO TABS
5.0000 mg | ORAL_TABLET | ORAL | Status: DC | PRN
  Administered 2024-01-29: 10 mg via ORAL

## 2024-01-29 MED ORDER — FENTANYL CITRATE (PF) 100 MCG/2ML IJ SOLN
INTRAMUSCULAR | Status: DC | PRN
  Administered 2024-01-29: 25 ug via INTRAVENOUS

## 2024-01-29 MED ORDER — MIDAZOLAM HCL 2 MG/2ML IJ SOLN
INTRAMUSCULAR | Status: DC | PRN
  Administered 2024-01-29: 1 mg via INTRAVENOUS

## 2024-01-29 MED ORDER — LIDOCAINE HCL (PF) 1 % IJ SOLN
INTRAMUSCULAR | Status: AC
  Filled 2024-01-29: qty 30

## 2024-01-29 MED ORDER — IODIXANOL 320 MG/ML IV SOLN
INTRAVENOUS | Status: DC | PRN
  Administered 2024-01-29: 55 mL

## 2024-01-29 MED ORDER — FENTANYL CITRATE (PF) 100 MCG/2ML IJ SOLN
INTRAMUSCULAR | Status: AC
  Filled 2024-01-29: qty 2

## 2024-01-29 MED ORDER — SODIUM CHLORIDE 0.9 % IV SOLN
INTRAVENOUS | Status: AC
Start: 2024-01-29 — End: 2024-01-29

## 2024-01-29 MED ORDER — ACETAMINOPHEN 325 MG PO TABS: 650.0000 mg | ORAL_TABLET | ORAL | Status: DC | PRN

## 2024-01-29 MED ORDER — LABETALOL HCL 5 MG/ML IV SOLN: 10.0000 mg | INTRAVENOUS | Status: DC | PRN

## 2024-01-29 MED ORDER — HEPARIN SODIUM (PORCINE) 1000 UNIT/ML IJ SOLN
INTRAMUSCULAR | Status: AC
  Filled 2024-01-29: qty 10

## 2024-01-29 MED ORDER — HEPARIN (PORCINE) IN NACL 1000-0.9 UT/500ML-% IV SOLN
INTRAVENOUS | Status: DC | PRN
Start: 2024-01-29 — End: 2024-01-29
  Administered 2024-01-29 (×2): 500 mL

## 2024-01-29 MED ORDER — ONDANSETRON HCL 4 MG/2ML IJ SOLN: 4.0000 mg | Freq: Four times a day (QID) | INTRAMUSCULAR | Status: DC | PRN

## 2024-01-29 MED ORDER — CLOPIDOGREL BISULFATE 75 MG PO TABS
ORAL_TABLET | ORAL | Status: AC
  Filled 2024-01-29: qty 1

## 2024-01-29 MED ORDER — LIDOCAINE HCL (PF) 1 % IJ SOLN
INTRAMUSCULAR | Status: DC | PRN
  Administered 2024-01-29: 20 mL
  Administered 2024-01-29: 10 mL

## 2024-01-29 SURGICAL SUPPLY — 14 items
BALLN STERLING OTW 3X40X150 (BALLOONS) ×1 IMPLANT
BALLOON STERLING OTW 3X40X150 (BALLOONS) IMPLANT
CATH OMNI FLUSH 5F 65CM (CATHETERS) IMPLANT
CATH QUICKCROSS SUPP .035X90CM (MICROCATHETER) IMPLANT
GLIDEWIRE ADV .035X260CM (WIRE) IMPLANT
KIT ENCORE 26 ADVANTAGE (KITS) IMPLANT
KIT MICROPUNCTURE NIT STIFF (SHEATH) IMPLANT
SET ATX-X65L (MISCELLANEOUS) IMPLANT
SHEATH CATAPULT 5F 45 MP (SHEATH) IMPLANT
SHEATH PINNACLE 5F 10CM (SHEATH) IMPLANT
SHEATH PROBE COVER 6X72 (BAG) IMPLANT
TRAY PV CATH (CUSTOM PROCEDURE TRAY) ×2 IMPLANT
WIRE BENTSON .035X145CM (WIRE) IMPLANT
WIRE G V18X300CM (WIRE) IMPLANT

## 2024-01-29 NOTE — Op Note (Signed)
 Patient name: Tyrone [name]. MRN: [id number] DOB: 11/22/1955 Sex: male  01/29/2024 Pre-operative Diagnosis: High-grade stenosis in right common femoral to anterior tibial bypass for CLI with rest pain Post-operative diagnosis:  Same Surgeon:  Cephus Shelling, MD Procedure Performed: 1.  Ultrasound-guided access left common femoral artery 2.  Aortogram with catheter selection of aorta 3.  Right lower extremity arteriogram with catheter selection of the right common femoral to anterior tibial bypass 4.  Angioplasty of distal anastomosis onto the right anterior tibial artery including the anterior tibial artery (3 mm x 40 mm Sterling) 5.  Angioplasty of the mid bypass graft at the knee in the right leg (3 mm x 40 mm Sterling) 6.  35 minutes of monitored moderate conscious sedation time  Indications: 68 year old male that underwent a right common femoral to anterior tibial bypass for critical limb ischemia with rest pain.  Recent bypass duplex showed a mid graft stenosis of 50% and a distal anastomotic stenosis of greater than 70% with a velocity of 510.  He presents for right lower extremity angiogram with possible intervention after risks benefits discussed.  Findings:   Ultrasound-guided access left common femoral artery.  Aortogram showed the left renal which was widely patent.  Infrarenal aorta had a aneurysm but is patent.  Both iliacs were patent without flow-limiting stenosis.    On the right the common femoral and profunda were patent without stenosis.  The right common femoral to anterior tibial bypass vein graft was patent.  The vein graft at the knee had a 50% stenosis that was focal.  The distal anastomosis had a greater than 70% stenosis that was also focal.  Two-vessel runoff in the anterior tibial and posterior tibial artery that were widely patent.  Ultimately I got down the bypass graft and the mid graft stenosis was treated with a 3 mm Sterling for 2 minutes and the  distal anastomosis was treated with a 3 mm Sterling for 2 minutes.  No significant residual stenosis.  Brisk runoff in the bypass.   Procedure:  The patient was identified in the holding area and taken to room 8.  The patient was then placed supine on the table and prepped and draped in the usual sterile fashion.  A time out was called.  Patient received Versed and Fentanyl for conscious moderate sedation.  Vital signs were monitored including heart rate, respiratory rate, oxygenation and blood pressure.  I was present for all of moderate sedation.  Ultrasound was used to evaluate the left common femoral artery.  It was patent .  A digital ultrasound image was acquired.  A micropuncture needle was used to access the left common femoral artery under ultrasound guidance.  An 018 wire was advanced without resistance and a micropuncture sheath was placed.  The 018 wire was removed and a benson wire was placed.  The micropuncture sheath was exchanged for a 5 french sheath.  An omniflush catheter was advanced over the wire to the level of L-1.  An abdominal angiogram was obtained.  Next, using the omniflush catheter and a glide wire, the aortic bifurcation was crossed and the catheter was placed into theright external iliac artery and right runoff was obtained.  We elected for intervention on the bypass graft stenosis.  I used a Glidewire advantage to get a 5 [place] sheath in the left groin over the aortic bifurcation.  Patient was given 100 units/kg IV heparin.  I then got my wire into the bypass graft  with a Glidewire advantage and used a quick cross to exchanged for a V18 wire down the right leg bypass.  I then used a V18 to get across the distal anastomotic stenosis.  The stenosis at the anastomosis to the anterior tibial was then treated with a 3 mm x 40 mm Sterling for 2 minutes.  I then pull my balloon back and did an angioplasty of the mid graft using a 3 mm x 40 mm Sterling for 2 minutes.  Widely patent bypass  at completion.  Preserved runoff distally.  Wires and catheters were removed.  A short 5 French sheath was placed in the left groin.  Will be taken holding for sheath removal.  Plan: Continue aspirin Plavix statin.  Will need follow-up in 1 month with duplex.  Great results today.   Young Hensen, MD [facility] [place] Office: [phone]

## 2024-01-29 NOTE — H&P (Signed)
 History and Physical Interval Note:  01/29/2024 9:49 AM  Tyrone [name].  has presented today for surgery, with the diagnosis of ischemia right lower.  The various methods of treatment have been discussed with the patient and family. After consideration of risks, benefits and other options for treatment, the patient has consented to  Procedure(s): ABDOMINAL AORTOGRAM (N/A) Lower Extremity Angiography (N/A) LOWER EXTREMITY INTERVENTION (N/A) as a surgical intervention.  The patient's history has been reviewed, patient examined, no change in status, stable for surgery.  I have reviewed the patient's chart and labs.  Questions were answered to the patient's satisfaction.    Evaluate right leg bypass.  Tyrone [name]  Office Note        CC:  follow up Requesting Provider:  Jearldine Mina, MD   HPI: Tyrone [name]. is a 68 y.o. (Jul 20, 1956) male who presents for surveillance of right lower extremity bypass.  He underwent redo right common femoral artery to anterior tibial artery bypass with vein by Dr. Fulton Job on 11/26/2023 due to critical limb ischemia with rest pain after an occluded right leg bypass.  Rest pain has resolved.  The generalized pain in his right leg has drastically improved now that his mobility has improved.  He is on aspirin and statin daily.  He continues to have numbness in his right great toe however this is stable compared to preop.  He believes his incisions are well-healed.         Past Medical History:  Diagnosis Date   Arthritis      hands, knees, back   COPD (chronic obstructive pulmonary disease) (HCC)     Depression     Diskitis 02/28/2021   Hardware complicating wound infection (HCC) 02/28/2021   Headache      migraines   Hypertension     Legionella pneumonia (HCC) 02/28/2021   Neuromuscular disorder (HCC)      hands   Peripheral vascular disease (HCC)     Pre-diabetes     Prostate cancer (HCC)     Smoker 11/05/2021   Vaccine counseling  11/05/2021               Past Surgical History:  Procedure Laterality Date   ABDOMINAL AORTOGRAM W/LOWER EXTREMITY Bilateral 10/18/2021    Procedure: ABDOMINAL AORTOGRAM W/LOWER EXTREMITY;  Surgeon: Tyrone Hensen, MD;  Location: [facility];  Service: Cardiovascular;  Laterality: Bilateral;   ABDOMINAL AORTOGRAM W/LOWER EXTREMITY Right 02/28/2022    Procedure: ABDOMINAL AORTOGRAM W/LOWER EXTREMITY;  Surgeon: Tyrone Hensen, MD;  Location: [facility];  Service: Cardiovascular;  Laterality: Right;   ABDOMINAL AORTOGRAM W/LOWER EXTREMITY Right 10/30/2023    Procedure: ABDOMINAL AORTOGRAM W/LOWER EXTREMITY;  Surgeon: Tyrone Hensen, MD;  Location: [facility];  Service: Cardiovascular;  Laterality: Right;   APPENDECTOMY       BACK SURGERY   1980"s    L4 to L 5 laminectomy   BYPASS GRAFT POPLITEAL TO TIBIAL Right 11/26/2023    Procedure: COMMON FEMORAL TO ANTERIOR TIBIAL ARTERY BYPASS USING GREATER SAPHENOUS VEIN;  Surgeon: Tyrone Hensen, MD;  Location[facility];  Service: Vascular;  Laterality: Right;   COLONOSCOPY WITH PROPOFOL N/A 02/06/2016    Procedure: COLONOSCOPY WITH PROPOFOL;  Surgeon: Garrett Kallman, MD;  Location: [facility];  Service: Endoscopy;  Laterality: N/A;   DIAGNOSTIC LAPAROSCOPY       ENDARTERECTOMY FEMORAL Left 10/24/2021    Procedure: ENDARTERECTOMY FEMORAL WITH PROFUNDAPLASTY;  Surgeon:  Cephus Shelling, MD;  Location: [facility] OR;  Service: Vascular;  Laterality: Left;   ENDARTERECTOMY FEMORAL Right 03/06/2022    Procedure: RIGHT COMMON FEMORAL ENDARTERECTOMY;  Surgeon: Cephus Shelling, MD;  Location: [facility] OR;  Service: Vascular;  Laterality: Right;   FEMORAL-POPLITEAL BYPASS GRAFT Left 10/24/2021    Procedure: LEFT COMMON FEMORAL- BELOW KNEE POPLITEAL BYPASS;  Surgeon: Cephus Shelling, MD;  Location: [facility];  Service: Vascular;  Laterality: Left;   FEMORAL-POPLITEAL BYPASS GRAFT Right 03/06/2022    Procedure: RIGHT  FEMORAL-POPLITEAL BYPASS WITH GORETEC GRAFT;  Surgeon: Cephus Shelling, MD;  Location: [facility];  Service: Vascular;  Laterality: Right;  INSERT ARTERIAL LINE   GROIN EXPOSURE Right 11/26/2023    Procedure: REDO RIGHT GROIN EXPOSURE;  Surgeon: Cephus Shelling, MD;  Location: [facility] OR;  Service: Vascular;  Laterality: Right;   LAPAROSCOPIC APPENDECTOMY N/A 08/13/2018    Procedure: APPENDECTOMY LAPAROSCOPIC;  Surgeon: Ovidio Kin, MD;  Location: [facility];  Service: General;  Laterality: N/A;   PATCH ANGIOPLASTY Left 10/24/2021    Procedure: PATCH ANGIOPLASTY;  Surgeon: Cephus Shelling, MD;  Location: [facility] OR;  Service: Vascular;  Laterality: Left;   PELVIC LYMPH NODE DISSECTION Bilateral 02/08/2019    Procedure: PELVIC LYMPH NODE DISSECTION;  Surgeon: Crista Elliot, MD;  Location: [facility];  Service: Urology;  Laterality: Bilateral;   ROBOT ASSISTED LAPAROSCOPIC RADICAL PROSTATECTOMY N/A 02/08/2019    Procedure: XI ROBOTIC ASSISTED LAPAROSCOPIC RADICAL PROSTATECTOMY;  Surgeon: Crista Elliot, MD;  Location: [facility];  Service: Urology;  Laterality: N/A;   TRANSFORAMINAL LUMBAR INTERBODY FUSION (TLIF) WITH PEDICLE SCREW FIXATION 1 LEVEL Left 09/12/2020    Procedure: LEFT-SIDED LUMBAR 1 - LUMBAR 2 TRANSFORAMINAL LUMBAR INTERBODY FUSION WITH INSTRUMENTATION AND ALLOGRAFT;  Surgeon: Estill Bamberg, MD;  Location: [facility];  Service: Orthopedics;  Laterality: Left;  3C BED   VEIN HARVEST Left 10/24/2021    Procedure: VEIN HARVEST;  Surgeon: Cephus Shelling, MD;  Location: [facility] OR;  Service: Vascular;  Laterality: Left;   VEIN HARVEST Right 11/26/2023    Procedure: VEIN HARVEST OF RIGHT GREATER SAPHANEOUS VEIN;  Surgeon: Cephus Shelling, MD;  Location: [facility] OR;  Service: Vascular;  Laterality: Right;   WRIST SURGERY Right      x 2          Social History         Socioeconomic History   Marital status: Married      Spouse name: Not on file   Number of children: 6   Years of education:  Not on file   Highest education level: Not on file  Occupational History   Occupation: Courtyard  Tobacco Use   Smoking status: Former      Current packs/day: 0.00      Types: Cigarettes      Quit date: 02/02/2022      Years since quitting: 1.9   Smokeless tobacco: Never   Tobacco comments:      Smoked 2 pkgs a day for 51 years  Vaping Use   Vaping status: Never Used  Substance and Sexual Activity   Alcohol use: Not Currently      Comment: occasional   Drug use: No   Sexual activity: Not Currently  Other Topics Concern   Not on file  Social History Narrative    Married for 46 years with 3 sons and 3 daughters. Smokes a ppd.     Social Drivers of Health  Financial Resource Strain: Not on file  Food Insecurity: No Food Insecurity (11/27/2023)    Hunger Vital Sign     Worried About Running Out of Food in the Last Year: Never true     Ran Out of Food in the Last Year: Never true  Transportation Needs: No Transportation Needs (11/27/2023)    PRAPARE - Therapist, art (Medical): No     Lack of Transportation (Non-Medical): No  Physical Activity: Not on file  Stress: Not on file  Social Connections: Moderately Isolated (11/27/2023)    Social Connection and Isolation Panel [NHANES]     Frequency of Communication with Friends and Family: More than three times a week     Frequency of Social Gatherings with Friends and Family: Once a week     Attends Religious Services: Never     Database administrator or Organizations: No     Attends Banker Meetings: Never     Marital Status: Married  Catering manager Violence: Not At Risk (11/27/2023)    Humiliation, Afraid, Rape, and Kick questionnaire     Fear of Current or Ex-Partner: No     Emotionally Abused: No     Physically Abused: No     Sexually Abused: No           Family History  Problem Relation Age of Onset   Colon cancer Maternal Grandmother     Colon cancer Maternal  Great-grandmother     Prostate cancer Neg Hx                  Current Outpatient Medications  Medication Sig Dispense Refill   albuterol (VENTOLIN HFA) 108 (90 Base) MCG/ACT inhaler Inhale 1-2 puffs into the lungs every 6 (six) hours as needed for wheezing or shortness of breath.       aspirin 325 MG tablet Take 325 mg by mouth every evening.       atorvastatin (LIPITOR) 80 MG tablet Take 1 tablet (80 mg total) by mouth daily. 90 tablet 3   buPROPion (WELLBUTRIN XL) 150 MG 24 hr tablet Take 150 mg by mouth daily.       clopidogrel (PLAVIX) 75 MG tablet Take 1 tablet (75 mg total) by mouth daily. 30 tablet 6   ezetimibe (ZETIA) 10 MG tablet Take 1 tablet (10 mg total) by mouth daily. 30 tablet 11   ipratropium-albuterol (DUONEB) 0.5-2.5 (3) MG/3ML SOLN Take 3 mLs by nebulization 2 (two) times daily. (Patient taking differently: Take 3 mLs by nebulization 2 (two) times daily as needed (SOB/Wheeezing).) 360 mL 0   ketorolac (TORADOL) 10 MG tablet Take 1 tablet (10 mg total) by mouth every 6 (six) hours. 8 tablet 0   Multiple Vitamin (MULTIVITAMIN WITH MINERALS) TABS tablet Take 1 tablet by mouth every evening. Centrum Silver       Oxycodone HCl 10 MG TABS Take 1 tablet (10 mg total) by mouth every 6 (six) hours as needed. 30 tablet 0   oxymetazoline (AFRIN) 0.05 % nasal spray Place 1 spray into both nostrils 2 (two) times daily as needed for congestion.          No current facility-administered medications for this visit.        Allergies       Allergies  Allergen Reactions   Neurontin [Gabapentin] Itching and Other (See Comments)      Confusion and sedation.   Celexa [Citalopram Hydrobromide]  restless leg/insomnia worse   Topamax [Topiramate]        numbness in hand          REVIEW OF SYSTEMS:    [X]  denotes positive finding, [ ]  denotes negative finding Cardiac   Comments:  Chest pain or chest pressure:      Shortness of breath upon exertion:      Short of breath  when lying flat:      Irregular heart rhythm:             Vascular      Pain in calf, thigh, or hip brought on by ambulation:      Pain in feet at night that wakes you up from your sleep:       Blood clot in your veins:      Leg swelling:              Pulmonary      Oxygen at home:      Productive cough:       Wheezing:              Neurologic      Sudden weakness in arms or legs:       Sudden numbness in arms or legs:       Sudden onset of difficulty speaking or slurred speech:      Temporary loss of vision in one eye:       Problems with dizziness:              Gastrointestinal      Blood in stool:       Vomited blood:              Genitourinary      Burning when urinating:       Blood in urine:             Psychiatric      Major depression:              Hematologic      Bleeding problems:      Problems with blood clotting too easily:             Skin      Rashes or ulcers:             Constitutional      Fever or chills:          PHYSICAL EXAMINATION:      Vitals:    01/20/24 1136  BP: (!) 156/95  Pulse: 96  Resp: 18  Temp: 98.1 F (36.7 C)  TempSrc: Temporal  SpO2: 94%  Weight: 234 lb 9.6 oz (106.4 kg)  Height: 5\' 10"  (1.778 m)      General:  WDWN in NAD; vital signs documented above Gait: Not observed HENT: WNL, normocephalic Pulmonary: normal non-labored breathing , without Rales, rhonchi,  wheezing Cardiac: regular HR Abdomen: soft, NT, no masses Skin: without rashes Vascular Exam/Pulses: 1+ palpable right DP pulse Extremities: without ischemic changes, without Gangrene , without cellulitis; without open wounds;  Musculoskeletal: no muscle wasting or atrophy       Neurologic: A&O X 3 Psychiatric:  The pt has Normal affect.     Non-Invasive Vascular Imaging:   Patent right leg bypass graft with 257 cm/s velocity in the mid graft and 500 cm/s velocity at the distal anastomosis       ASSESSMENT/PLAN:: 68 y.o. male here for surveillance  of right leg bypass   Right  leg well-perfused with a 1+ palpable DP pulse Unfortunately duplex demonstrates significant stenosis of the distal anastomosis. He will be started on Plavix in addition to his aspirin and statin daily Plan will be to proceed with aortogram with right lower extremity runoff with revascularization of the distal anastomosis with Dr. Fulton Job on 01/29/2024 Case was discussed in detail with the patient and he is agreeable to proceed     Cordie Deters, PA-C [facility] [phone]   Clinic MD:   Fulton Job

## 2024-01-29 NOTE — Progress Notes (Signed)
 Patient arrived to [facility] area room 18. Left groin sheath is intact with no bleeding or hematoma noted. Post activity and precautions explained; patient verbalized understanding. VSS. Bilateral pulses obtained and unchanged from previous assessment. Will continue to monitor per orders and protocol.Amaro Mangold E

## 2024-01-29 NOTE — Progress Notes (Signed)
Pt ambulated to and from bathroom to void with no signs of oozing from left groin site  

## 2024-01-29 NOTE — Progress Notes (Signed)
 Site area: L groin (38fr Arterial) Site Prior to Removal:  Level 0 Pressure Applied For: 25 min Manual:   Yes Patient Status During Pull:  Stable Post Pull Site:  Level 0 Post Pull Instructions Given:  Yes Post Pull Pulses Present: +1 R L DP Dressing Applied:  Gauze & tegaderm Bedrest begins @ 1427 Comments: Pt resting, denies pain during pull.  After manual pressure, area soft, nontender, bleeding controlled, no oozing.  Dressing applied.  Pt verbalized understanding of post pull instructions, no questions at this time.  Will continue to monitor.

## 2024-01-30 ENCOUNTER — Encounter (HOSPITAL_COMMUNITY): Payer: Self-pay | Admitting: Vascular Surgery

## 2024-02-03 ENCOUNTER — Ambulatory Visit

## 2024-02-03 ENCOUNTER — Encounter (HOSPITAL_COMMUNITY)

## 2024-02-03 ENCOUNTER — Other Ambulatory Visit (HOSPITAL_COMMUNITY)

## 2024-02-18 ENCOUNTER — Other Ambulatory Visit: Payer: Self-pay

## 2024-02-18 DIAGNOSIS — I739 Peripheral vascular disease, unspecified: Secondary | ICD-10-CM

## 2024-02-25 DIAGNOSIS — I10 Essential (primary) hypertension: Secondary | ICD-10-CM | POA: Diagnosis not present

## 2024-02-25 DIAGNOSIS — F17210 Nicotine dependence, cigarettes, uncomplicated: Secondary | ICD-10-CM | POA: Diagnosis not present

## 2024-02-25 DIAGNOSIS — Z8546 Personal history of malignant neoplasm of prostate: Secondary | ICD-10-CM | POA: Diagnosis not present

## 2024-02-25 DIAGNOSIS — E1151 Type 2 diabetes mellitus with diabetic peripheral angiopathy without gangrene: Secondary | ICD-10-CM | POA: Diagnosis not present

## 2024-02-25 DIAGNOSIS — J449 Chronic obstructive pulmonary disease, unspecified: Secondary | ICD-10-CM | POA: Diagnosis not present

## 2024-02-25 DIAGNOSIS — Z95828 Presence of other vascular implants and grafts: Secondary | ICD-10-CM | POA: Diagnosis not present

## 2024-02-25 DIAGNOSIS — F331 Major depressive disorder, recurrent, moderate: Secondary | ICD-10-CM | POA: Diagnosis not present

## 2024-02-25 DIAGNOSIS — I739 Peripheral vascular disease, unspecified: Secondary | ICD-10-CM | POA: Diagnosis not present

## 2024-02-25 DIAGNOSIS — E782 Mixed hyperlipidemia: Secondary | ICD-10-CM | POA: Diagnosis not present

## 2024-02-25 DIAGNOSIS — I70221 Atherosclerosis of native arteries of extremities with rest pain, right leg: Secondary | ICD-10-CM | POA: Diagnosis not present

## 2024-02-27 ENCOUNTER — Ambulatory Visit (HOSPITAL_BASED_OUTPATIENT_CLINIC_OR_DEPARTMENT_OTHER)
Admission: RE | Admit: 2024-02-27 | Discharge: 2024-02-27 | Disposition: A | Discharge status: Home / Self Care | Source: Ambulatory Visit | Attending: Vascular Surgery | Admitting: Vascular Surgery

## 2024-02-27 ENCOUNTER — Ambulatory Visit (HOSPITAL_COMMUNITY)
Admission: RE | Admit: 2024-02-27 | Discharge: 2024-02-27 | Disposition: A | Discharge status: Home / Self Care | Source: Ambulatory Visit | Attending: Vascular Surgery | Admitting: Vascular Surgery

## 2024-02-27 DIAGNOSIS — I739 Peripheral vascular disease, unspecified: Secondary | ICD-10-CM | POA: Diagnosis not present

## 2024-02-27 LAB — VAS US ABI WITH/WO TBI
Left ABI: 0.93
Right ABI: 0.76

## 2024-03-09 ENCOUNTER — Encounter: Payer: Self-pay | Admitting: Vascular Surgery

## 2024-03-09 ENCOUNTER — Ambulatory Visit: Attending: Vascular Surgery | Admitting: Vascular Surgery

## 2024-03-09 VITALS — BP 151/88 | HR 100 | Temp 98.3°F | Resp 20 | Ht 70.0 in | Wt 236.8 lb

## 2024-03-09 DIAGNOSIS — I739 Peripheral vascular disease, unspecified: Secondary | ICD-10-CM

## 2024-03-09 DIAGNOSIS — I70221 Atherosclerosis of native arteries of extremities with rest pain, right leg: Secondary | ICD-10-CM

## 2024-03-09 DIAGNOSIS — T82858D Stenosis of vascular prosthetic devices, implants and grafts, subsequent encounter: Secondary | ICD-10-CM | POA: Diagnosis not present

## 2024-03-09 DIAGNOSIS — T82858A Stenosis of vascular prosthetic devices, implants and grafts, initial encounter: Secondary | ICD-10-CM | POA: Insufficient documentation

## 2024-03-09 NOTE — Progress Notes (Signed)
 Patient name: Tyrone [name]. MRN: [id number] DOB: 07-12-1956 Sex: male  REASON FOR CONSULT: Surveillance right leg bypass with 1 month follow-up  HPI: Tyrone [name]. is a 68 y.o. male, with history of COPD, hypertension, prostate cancer, tobacco abuse that presents for 1 month follow-up after recent right leg bypass intervention.  On 01/29/2024 he underwent angioplasty of the distal bypass including the anastomosis.  Prior to this he underwent right common femoral artery to AT bypass with great saphenous vein on 11/26/2023 for an occluded right leg bypass.  States he is having cramping in his right calf again when he walks since most recent endo intervention.  This is after very short distances.  Taking his aspirin  statin Plavix .  He was initially evaluated for severe PAD.  He  underwent a right common femoral endarterectomy with bovine patch and a right common femoral to below-knee popliteal bypass with PTFE on 03/06/2022 for CLI with rest pain.  He also previously had a left leg bypass on 10/24/2021 with a left common femoral endarterectomy and bovine patch and a left common femoral to below knee popliteal artery bypass with PTFE.       Past Medical History:  Diagnosis Date   Arthritis    hands, knees, back   COPD (chronic obstructive pulmonary disease) (HCC)    Depression    Diskitis 02/28/2021   Hardware complicating wound infection (HCC) 02/28/2021   Headache    migraines   Hypertension    Legionella pneumonia (HCC) 02/28/2021   Neuromuscular disorder (HCC)    hands   Peripheral vascular disease (HCC)    Pre-diabetes    Prostate cancer (HCC)    Smoker 11/05/2021   Vaccine counseling 11/05/2021    Past Surgical History:  Procedure Laterality Date   ABDOMINAL AORTOGRAM N/A 01/29/2024   Procedure: ABDOMINAL AORTOGRAM;  Surgeon: Young Hensen, MD;  Location: [facility];  Service: Cardiovascular;  Laterality: N/A;   ABDOMINAL AORTOGRAM W/LOWER EXTREMITY  Bilateral 10/18/2021   Procedure: ABDOMINAL AORTOGRAM W/LOWER EXTREMITY;  Surgeon: Young Hensen, MD;  Location: [facility];  Service: Cardiovascular;  Laterality: Bilateral;   ABDOMINAL AORTOGRAM W/LOWER EXTREMITY Right 02/28/2022   Procedure: ABDOMINAL AORTOGRAM W/LOWER EXTREMITY;  Surgeon: Young Hensen, MD;  Location: [facility];  Service: Cardiovascular;  Laterality: Right;   ABDOMINAL AORTOGRAM W/LOWER EXTREMITY Right 10/30/2023   Procedure: ABDOMINAL AORTOGRAM W/LOWER EXTREMITY;  Surgeon: Young Hensen, MD;  Location: [facility];  Service: Cardiovascular;  Laterality: Right;   APPENDECTOMY     BACK SURGERY  1980"s   L4 to L 5 laminectomy   BYPASS GRAFT POPLITEAL TO TIBIAL Right 11/26/2023   Procedure: COMMON FEMORAL TO ANTERIOR TIBIAL ARTERY BYPASS USING GREATER SAPHENOUS VEIN;  Surgeon: Young Hensen, MD;  Location: [facility] OR;  Service: Vascular;  Laterality: Right;   COLONOSCOPY WITH PROPOFOL  N/A 02/06/2016   Procedure: COLONOSCOPY WITH PROPOFOL ;  Surgeon: Garrett Kallman, MD;  Location: [facility];  Service: Endoscopy;  Laterality: N/A;   DIAGNOSTIC LAPAROSCOPY     ENDARTERECTOMY FEMORAL Left 10/24/2021   Procedure: ENDARTERECTOMY FEMORAL WITH PROFUNDAPLASTY;  Surgeon: Young Hensen, MD;  Location: [facility] OR;  Service: Vascular;  Laterality: Left;   ENDARTERECTOMY FEMORAL Right 03/06/2022   Procedure: RIGHT COMMON FEMORAL ENDARTERECTOMY;  Surgeon: Young Hensen, MD;  Location: [facility] OR;  Service: Vascular;  Laterality: Right;   FEMORAL-POPLITEAL BYPASS GRAFT Left 10/24/2021   Procedure: LEFT COMMON FEMORAL- BELOW  KNEE POPLITEAL BYPASS;  Surgeon: Young Hensen, MD;  Location: [facility] OR;  Service: Vascular;  Laterality: Left;   FEMORAL-POPLITEAL BYPASS GRAFT Right 03/06/2022   Procedure: RIGHT FEMORAL-POPLITEAL BYPASS WITH GORETEC GRAFT;  Surgeon: Young Hensen, MD;  Location: [facility] OR;  Service: Vascular;  Laterality: Right;  INSERT  ARTERIAL LINE   GROIN EXPOSURE Right 11/26/2023   Procedure: REDO RIGHT GROIN EXPOSURE;  Surgeon: Young Hensen, MD;  Location: [facility] OR;  Service: Vascular;  Laterality: Right;   LAPAROSCOPIC APPENDECTOMY N/A 08/13/2018   Procedure: APPENDECTOMY LAPAROSCOPIC;  Surgeon: Juanita Norlander, MD;  Location: [facility];  Service: General;  Laterality: N/A;   LOWER EXTREMITY ANGIOGRAPHY N/A 01/29/2024   Procedure: Lower Extremity Angiography;  Surgeon: Young Hensen, MD;  Location: [facility] INVASIVE CV LAB;  Service: Cardiovascular;  Laterality: N/A;   LOWER EXTREMITY INTERVENTION N/A 01/29/2024   Procedure: LOWER EXTREMITY INTERVENTION;  Surgeon: Young Hensen, MD;  Location: [facility];  Service: Cardiovascular;  Laterality: N/A;   PATCH ANGIOPLASTY Left 10/24/2021   Procedure: PATCH ANGIOPLASTY;  Surgeon: Young Hensen, MD;  Location: [facility] OR;  Service: Vascular;  Laterality: Left;   PELVIC LYMPH NODE DISSECTION Bilateral 02/08/2019   Procedure: PELVIC LYMPH NODE DISSECTION;  Surgeon: Samson Croak, MD;  Location: [facility];  Service: Urology;  Laterality: Bilateral;   ROBOT ASSISTED LAPAROSCOPIC RADICAL PROSTATECTOMY N/A 02/08/2019   Procedure: XI ROBOTIC ASSISTED LAPAROSCOPIC RADICAL PROSTATECTOMY;  Surgeon: Samson Croak, MD;  Location: [facility];  Service: Urology;  Laterality: N/A;   TRANSFORAMINAL LUMBAR INTERBODY FUSION (TLIF) WITH PEDICLE SCREW FIXATION 1 LEVEL Left 09/12/2020   Procedure: LEFT-SIDED LUMBAR 1 - LUMBAR 2 TRANSFORAMINAL LUMBAR INTERBODY FUSION WITH INSTRUMENTATION AND ALLOGRAFT;  Surgeon: Virl Grimes, MD;  Location: [facility] OR;  Service: Orthopedics;  Laterality: Left;  3C BED   VEIN HARVEST Left 10/24/2021   Procedure: VEIN HARVEST;  Surgeon: Young Hensen, MD;  Location: [facility] OR;  Service: Vascular;  Laterality: Left;   VEIN HARVEST Right 11/26/2023   Procedure: VEIN HARVEST OF RIGHT GREATER SAPHANEOUS VEIN;  Surgeon: Young Hensen, MD;  Location: [facility]  OR;  Service: Vascular;  Laterality: Right;   WRIST SURGERY Right    x 2     Family History  Problem Relation Age of Onset   Colon cancer Maternal Grandmother    Colon cancer Maternal Great-grandmother    Prostate cancer Neg Hx     SOCIAL HISTORY: Social History   Socioeconomic History   Marital status: Married    Spouse name: Not on file   Number of children: 6   Years of education: Not on file   Highest education level: Not on file  Occupational History   Occupation: Courtyard  Tobacco Use   Smoking status: Former    Current packs/day: 0.00    Types: Cigarettes    Quit date: 02/02/2022    Years since quitting: 2.0   Smokeless tobacco: Never   Tobacco comments:    Smoked 2 pkgs a day for 51 years  Vaping Use   Vaping status: Never Used  Substance and Sexual Activity   Alcohol use: Not Currently    Comment: occasional   Drug use: No   Sexual activity: Not Currently  Other Topics Concern   Not on file  Social History Narrative   Married for 46 years with 3 sons and 3 daughters. Smokes a ppd.    Social Drivers of Corporate investment banker  Strain: Not on file  Food Insecurity: No Food Insecurity (11/27/2023)   Hunger Vital Sign    Worried About Running Out of Food in the Last Year: Never true    Ran Out of Food in the Last Year: Never true  Transportation Needs: No Transportation Needs (11/27/2023)   PRAPARE - Administrator, Civil Service (Medical): No    Lack of Transportation (Non-Medical): No  Physical Activity: Not on file  Stress: Not on file  Social Connections: Moderately Isolated (11/27/2023)   Social Connection and Isolation Panel [NHANES]    Frequency of Communication with Friends and Family: More than three times a week    Frequency of Social Gatherings with Friends and Family: Once a week    Attends Religious Services: Never    Database administrator or Organizations: No    Attends Banker Meetings: Never    Marital  Status: Married  Catering manager Violence: Not At Risk (11/27/2023)   Humiliation, Afraid, Rape, and Kick questionnaire    Fear of Current or Ex-Partner: No    Emotionally Abused: No    Physically Abused: No    Sexually Abused: No    Allergies  Allergen Reactions   Neurontin [Gabapentin] Itching and Other (See Comments)    Confusion and sedation.   Celexa [Citalopram Hydrobromide]     restless leg/insomnia worse   Topamax [Topiramate]     numbness in hand    Current Outpatient Medications  Medication Sig Dispense Refill   albuterol  (VENTOLIN  HFA) 108 (90 Base) MCG/ACT inhaler Inhale 1-2 puffs into the lungs every 6 (six) hours as needed for wheezing or shortness of breath.     aspirin  325 MG tablet Take 325 mg by mouth every evening.     atorvastatin  (LIPITOR ) 80 MG tablet Take 1 tablet (80 mg total) by mouth daily. 90 tablet 3   buPROPion  (WELLBUTRIN  XL) 150 MG 24 hr tablet Take 150 mg by mouth daily.     clopidogrel  (PLAVIX ) 75 MG tablet Take 1 tablet (75 mg total) by mouth daily. 30 tablet 6   ezetimibe  (ZETIA ) 10 MG tablet Take 1 tablet (10 mg total) by mouth daily. 30 tablet 11   ipratropium-albuterol  (DUONEB) 0.5-2.5 (3) MG/3ML SOLN Take 3 mLs by nebulization 2 (two) times daily. (Patient taking differently: Take 3 mLs by nebulization 2 (two) times daily as needed (SOB/Wheeezing).) 360 mL 0   Multiple Vitamin (MULTIVITAMIN WITH MINERALS) TABS tablet Take 1 tablet by mouth every evening. Centrum Silver     oxymetazoline  (AFRIN) 0.05 % nasal spray Place 1 spray into both nostrils 2 (two) times daily as needed for congestion.     No current facility-administered medications for this visit.    REVIEW OF SYSTEMS:  [X]  denotes positive finding, [ ]  denotes negative finding Cardiac  Comments:  Chest pain or chest pressure:    Shortness of breath upon exertion:    Short of breath when lying flat:    Irregular heart rhythm:        Vascular    Pain in calf, thigh, or hip  brought on by ambulation: x right  Pain in feet at night that wakes you up from your sleep:     Blood clot in your veins:    Leg swelling:         Pulmonary    Oxygen  at home:    Productive cough:     Wheezing:         Neurologic  Sudden weakness in arms or legs:     Sudden numbness in arms or legs:     Sudden onset of difficulty speaking or slurred speech:    Temporary loss of vision in one eye:     Problems with dizziness:         Gastrointestinal    Blood in stool:     Vomited blood:         Genitourinary    Burning when urinating:     Blood in urine:        Psychiatric    Major depression:         Hematologic    Bleeding problems:    Problems with blood clotting too easily:        Skin    Rashes or ulcers:        Constitutional    Fever or chills:      PHYSICAL EXAM: There were no vitals filed for this visit.   GENERAL: The patient is a well-nourished male, in no acute distress. The vital signs are documented above. CARDIAC: There is a regular rate and rhythm.  VASCULAR:  Bilateral femoral pulses palpable Palpable pulse in right fem to AT bypass graft with a palpable AT pulse at the ankle  DATA:   ABIs today are improved on the right from 0.6 to 0.76 after recent intervention  Lower extremity arterial duplex does show a patent right common femoral to AT bypass.  There is a mid graft stenosis with a velocity of 333 although the distal anastomosis looks much better with a velocity of 233 improved from 510.   Assessment/Plan:  67 y.o. male, with history of COPD, hypertension, prostate cancer, tobacco abuse that presents for 1 month follow-up after recent right leg bypass intervention.  On 01/29/2024 he underwent angioplasty of the distal bypass including the anastomosis onto the AT.  Prior to this he underwent right common femoral artery to AT bypass with great saphenous vein on 11/26/2023 for an occluded right leg bypass.  Although the distal bypass  anastomotic stenosis looks much better he does have a mid graft stenosis now based on duplex.  He is endorsing short distance claudication symptoms in his right leg.  Given he has already had redo bypass in the right leg with limited options moving forward I think we should be very aggressive.  I have recommended returning to the Cath Lab for aortogram, lower extremity arteriogram and possible intervention.  Risk benefits again discussed.  Will get scheduled in the Cath Lab.  Discussed this to maintain bypass patency and hopefully improve his claudication.  Young Hensen, MD [facility] Office: [phone]

## 2024-03-10 ENCOUNTER — Other Ambulatory Visit: Payer: Self-pay

## 2024-03-10 DIAGNOSIS — I70221 Atherosclerosis of native arteries of extremities with rest pain, right leg: Secondary | ICD-10-CM

## 2024-03-18 ENCOUNTER — Encounter (HOSPITAL_COMMUNITY): Payer: Self-pay | Admitting: Vascular Surgery

## 2024-03-18 ENCOUNTER — Ambulatory Visit (HOSPITAL_COMMUNITY)
Admission: RE | Admit: 2024-03-18 | Discharge: 2024-03-18 | Disposition: A | Discharge status: Home / Self Care | Attending: Vascular Surgery | Admitting: Vascular Surgery

## 2024-03-18 ENCOUNTER — Other Ambulatory Visit: Payer: Self-pay

## 2024-03-18 ENCOUNTER — Encounter (HOSPITAL_COMMUNITY)
Admission: RE | Disposition: A | Discharge status: Home / Self Care | Payer: Self-pay | Source: Home / Self Care | Attending: Vascular Surgery

## 2024-03-18 DIAGNOSIS — I70221 Atherosclerosis of native arteries of extremities with rest pain, right leg: Secondary | ICD-10-CM

## 2024-03-18 DIAGNOSIS — Z87891 Personal history of nicotine dependence: Secondary | ICD-10-CM | POA: Insufficient documentation

## 2024-03-18 DIAGNOSIS — Z7902 Long term (current) use of antithrombotics/antiplatelets: Secondary | ICD-10-CM | POA: Insufficient documentation

## 2024-03-18 DIAGNOSIS — Z95820 Peripheral vascular angioplasty status with implants and grafts: Secondary | ICD-10-CM

## 2024-03-18 DIAGNOSIS — I739 Peripheral vascular disease, unspecified: Secondary | ICD-10-CM | POA: Insufficient documentation

## 2024-03-18 DIAGNOSIS — Z95828 Presence of other vascular implants and grafts: Secondary | ICD-10-CM | POA: Diagnosis not present

## 2024-03-18 DIAGNOSIS — Y832 Surgical operation with anastomosis, bypass or graft as the cause of abnormal reaction of the patient, or of later complication, without mention of misadventure at the time of the procedure: Secondary | ICD-10-CM | POA: Insufficient documentation

## 2024-03-18 DIAGNOSIS — T82858A Stenosis of vascular prosthetic devices, implants and grafts, initial encounter: Secondary | ICD-10-CM | POA: Insufficient documentation

## 2024-03-18 DIAGNOSIS — Z7982 Long term (current) use of aspirin: Secondary | ICD-10-CM | POA: Diagnosis not present

## 2024-03-18 HISTORY — PX: ABDOMINAL AORTOGRAM: CATH118222

## 2024-03-18 HISTORY — PX: LOWER EXTREMITY INTERVENTION: CATH118252

## 2024-03-18 HISTORY — PX: LOWER EXTREMITY ANGIOGRAPHY: CATH118251

## 2024-03-18 LAB — POCT I-STAT, CHEM 8
BUN: 22 mg/dL (ref 8–23)
Calcium, Ion: 1.33 mmol/L (ref 1.15–1.40)
Chloride: 104 mmol/L (ref 98–111)
Creatinine, Ser: 1.1 mg/dL (ref 0.61–1.24)
Glucose, Bld: 126 mg/dL — ABNORMAL HIGH (ref 70–99)
HCT: 43 % (ref 39.0–52.0)
Hemoglobin: 14.6 g/dL (ref 13.0–17.0)
Potassium: 4 mmol/L (ref 3.5–5.1)
Sodium: 142 mmol/L (ref 135–145)
TCO2: 24 mmol/L (ref 22–32)

## 2024-03-18 LAB — POCT ACTIVATED CLOTTING TIME
Activated Clotting Time: 256 s
Activated Clotting Time: 193 s
Activated Clotting Time: 164 s

## 2024-03-18 SURGERY — ABDOMINAL AORTOGRAM
Anesthesia: LOCAL

## 2024-03-18 MED ORDER — MIDAZOLAM HCL 2 MG/2ML IJ SOLN
INTRAMUSCULAR | Status: AC
  Filled 2024-03-18: qty 2

## 2024-03-18 MED ORDER — ACETAMINOPHEN 325 MG PO TABS: 650.0000 mg | ORAL_TABLET | ORAL | Status: DC | PRN

## 2024-03-18 MED ORDER — HEPARIN SODIUM (PORCINE) 1000 UNIT/ML IJ SOLN
INTRAMUSCULAR | Status: DC | PRN
  Administered 2024-03-18: 11000 [IU] via INTRAVENOUS

## 2024-03-18 MED ORDER — OXYCODONE HCL 5 MG PO TABS
5.0000 mg | ORAL_TABLET | ORAL | Status: DC | PRN
  Administered 2024-03-18: 10 mg via ORAL
  Filled 2024-03-18: qty 2

## 2024-03-18 MED ORDER — CLOPIDOGREL BISULFATE 75 MG PO TABS
ORAL_TABLET | ORAL | Status: AC
  Filled 2024-03-18: qty 1

## 2024-03-18 MED ORDER — ASPIRIN 81 MG PO CHEW
CHEWABLE_TABLET | ORAL | Status: DC | PRN
  Administered 2024-03-18: 81 mg via ORAL

## 2024-03-18 MED ORDER — LIDOCAINE HCL (PF) 1 % IJ SOLN
INTRAMUSCULAR | Status: AC
Start: 2024-03-18 — End: ?
  Filled 2024-03-18: qty 30

## 2024-03-18 MED ORDER — ASPIRIN 81 MG PO CHEW
CHEWABLE_TABLET | ORAL | Status: AC
  Filled 2024-03-18: qty 1

## 2024-03-18 MED ORDER — LABETALOL HCL 5 MG/ML IV SOLN: 10.0000 mg | INTRAVENOUS | Status: DC | PRN

## 2024-03-18 MED ORDER — SODIUM CHLORIDE 0.9 % IV SOLN: INTRAVENOUS | Status: DC

## 2024-03-18 MED ORDER — ONDANSETRON HCL 4 MG/2ML IJ SOLN: 4.0000 mg | Freq: Four times a day (QID) | INTRAMUSCULAR | Status: DC | PRN

## 2024-03-18 MED ORDER — IODIXANOL 320 MG/ML IV SOLN
INTRAVENOUS | Status: DC | PRN
  Administered 2024-03-18: 100 mL

## 2024-03-18 MED ORDER — FENTANYL CITRATE (PF) 100 MCG/2ML IJ SOLN
INTRAMUSCULAR | Status: DC | PRN
  Administered 2024-03-18: 25 ug via INTRAVENOUS
  Administered 2024-03-18: 50 ug via INTRAVENOUS

## 2024-03-18 MED ORDER — SODIUM CHLORIDE 0.9 % IV SOLN
250.0000 mL | INTRAVENOUS | Status: DC | PRN
Start: 2024-03-18 — End: 2024-03-19

## 2024-03-18 MED ORDER — HEPARIN SODIUM (PORCINE) 1000 UNIT/ML IJ SOLN
INTRAMUSCULAR | Status: AC
  Filled 2024-03-18: qty 10

## 2024-03-18 MED ORDER — LIDOCAINE HCL (PF) 1 % IJ SOLN
INTRAMUSCULAR | Status: DC | PRN
Start: 2024-03-18 — End: 2024-03-19
  Administered 2024-03-18: 10 mL

## 2024-03-18 MED ORDER — CLOPIDOGREL BISULFATE 75 MG PO TABS
ORAL_TABLET | ORAL | Status: DC | PRN
  Administered 2024-03-18: 75 mg via ORAL

## 2024-03-18 MED ORDER — HYDRALAZINE HCL 20 MG/ML IJ SOLN: 5.0000 mg | INTRAMUSCULAR | Status: DC | PRN

## 2024-03-18 MED ORDER — MIDAZOLAM HCL 2 MG/2ML IJ SOLN
INTRAMUSCULAR | Status: DC | PRN
  Administered 2024-03-18: 2 mg via INTRAVENOUS

## 2024-03-18 MED ORDER — FENTANYL CITRATE (PF) 100 MCG/2ML IJ SOLN
INTRAMUSCULAR | Status: AC
Start: 2024-03-18 — End: ?
  Filled 2024-03-18: qty 2

## 2024-03-18 MED ORDER — CLOPIDOGREL BISULFATE 75 MG PO TABS: 75.0000 mg | ORAL_TABLET | Freq: Every day | ORAL | Status: DC

## 2024-03-18 MED ORDER — SODIUM CHLORIDE 0.9% FLUSH: 3.0000 mL | Freq: Two times a day (BID) | INTRAVENOUS | Status: DC

## 2024-03-18 MED ORDER — HEPARIN (PORCINE) IN NACL 2000-0.9 UNIT/L-% IV SOLN
INTRAVENOUS | Status: DC | PRN
Start: 2024-03-18 — End: 2024-03-19
  Administered 2024-03-18: 1000 mL

## 2024-03-18 MED ORDER — SODIUM CHLORIDE 0.9% FLUSH
3.0000 mL | INTRAVENOUS | Status: DC | PRN
Start: 2024-03-18 — End: 2024-03-19

## 2024-03-18 MED ORDER — ASPIRIN 81 MG PO TBEC: 81.0000 mg | DELAYED_RELEASE_TABLET | Freq: Every day | ORAL | Status: DC

## 2024-03-18 MED ORDER — SODIUM CHLORIDE 0.9 % IV SOLN: INTRAVENOUS | Status: AC

## 2024-03-18 SURGICAL SUPPLY — 23 items
BALLOON STERLING OTW 4X60X135 (BALLOONS) IMPLANT
BALLOON STERLING OTW 4X80X135 (BALLOONS) IMPLANT
BALLOON STERLING OTW 5X20X135 (BALLOONS) IMPLANT
CATH OMNI FLUSH 5F 65CM (CATHETERS) IMPLANT
CATH QUICKCROSS SUPP .035X90CM (MICROCATHETER) IMPLANT
COVER DOME SNAP 22 D (MISCELLANEOUS) IMPLANT
DCB RANGER 4.0X100 135 (BALLOONS) IMPLANT
DCB RANGER 4.0X40 135 (BALLOONS) IMPLANT
KIT ENCORE 26 ADVANTAGE (KITS) IMPLANT
KIT MICROPUNCTURE NIT STIFF (SHEATH) IMPLANT
SET ATX-X65L (MISCELLANEOUS) IMPLANT
SHEATH CATAPULT 5F 45 MP (SHEATH) IMPLANT
SHEATH CATAPULT 6FR 45 (SHEATH) IMPLANT
SHEATH PINNACLE 5F 10CM (SHEATH) IMPLANT
SHEATH PINNACLE 6F 10CM (SHEATH) IMPLANT
SHEATH PROBE COVER 6X72 (BAG) IMPLANT
STENT VIABAHN 5X100X120 (Permanent Stent) IMPLANT
STENT VIABAHN 5X50X120 (Permanent Stent) IMPLANT
TRAY PV CATH (CUSTOM PROCEDURE TRAY) ×2 IMPLANT
WIRE BENTSON .035X145CM (WIRE) IMPLANT
WIRE G V18X300CM (WIRE) IMPLANT
WIRE ROSEN-J .035X260CM (WIRE) IMPLANT
WIRE TORQFLEX AUST .018X40CM (WIRE) IMPLANT

## 2024-03-18 NOTE — H&P (Signed)
 History and Physical Interval Note:  03/18/2024 8:48 AM  Tyrone [name].  has presented today for surgery, with the diagnosis of ischemia right lower extremity.  The various methods of treatment have been discussed with the patient and family. After consideration of risks, benefits and other options for treatment, the patient has consented to  Procedure(s): ABDOMINAL AORTOGRAM (N/A) Lower Extremity Angiography (N/A) LOWER EXTREMITY INTERVENTION (N/A) as a surgical intervention.  The patient's history has been reviewed, patient examined, no change in status, stable for surgery.  I have reviewed the patient's chart and labs.  Questions were answered to the patient's satisfaction.     Tyrone [name]     Patient name: Tyrone [name].    MRN: [id number]        DOB: 26-Sep-1956          Sex: male   REASON FOR CONSULT: Surveillance right leg bypass with 1 month follow-up   HPI: Tyrone [name]. is a 68 y.o. male, with history of COPD, hypertension, prostate cancer, tobacco abuse that presents for 1 month follow-up after recent right leg bypass intervention.  On 01/29/2024 he underwent angioplasty of the distal bypass including the anastomosis.  Prior to this he underwent right common femoral artery to AT bypass with great saphenous vein on 11/26/2023 for an occluded right leg bypass.   States he is having cramping in his right calf again when he walks since most recent endo intervention.  This is after very short distances.  Taking his aspirin  statin Plavix .   He was initially evaluated for severe PAD.  He  underwent a right common femoral endarterectomy with bovine patch and a right common femoral to below-knee popliteal bypass with PTFE on 03/06/2022 for CLI with rest pain.  He also previously had a left leg bypass on 10/24/2021 with a left common femoral endarterectomy and bovine patch and a left common femoral to below knee popliteal artery bypass with PTFE.               Past  Medical History:  Diagnosis Date   Arthritis      hands, knees, back   COPD (chronic obstructive pulmonary disease) (HCC)     Depression     Diskitis 02/28/2021   Hardware complicating wound infection (HCC) 02/28/2021   Headache      migraines   Hypertension     Legionella pneumonia (HCC) 02/28/2021   Neuromuscular disorder (HCC)      hands   Peripheral vascular disease (HCC)     Pre-diabetes     Prostate cancer (HCC)     Smoker 11/05/2021   Vaccine counseling 11/05/2021               Past Surgical History:  Procedure Laterality Date   ABDOMINAL AORTOGRAM N/A 01/29/2024    Procedure: ABDOMINAL AORTOGRAM;  Surgeon: Tyrone Hensen, MD;  Location: [facility];  Service: Cardiovascular;  Laterality: N/A;   ABDOMINAL AORTOGRAM W/LOWER EXTREMITY Bilateral 10/18/2021    Procedure: ABDOMINAL AORTOGRAM W/LOWER EXTREMITY;  Surgeon: Tyrone Hensen, MD;  Location: [facility];  Service: Cardiovascular;  Laterality: Bilateral;   ABDOMINAL AORTOGRAM W/LOWER EXTREMITY Right 02/28/2022    Procedure: ABDOMINAL AORTOGRAM W/LOWER EXTREMITY;  Surgeon: Tyrone Hensen, MD;  Location: [facility];  Service: Cardiovascular;  Laterality: Right;   ABDOMINAL AORTOGRAM W/LOWER EXTREMITY Right 10/30/2023    Procedure: ABDOMINAL AORTOGRAM W/LOWER EXTREMITY;  Surgeon: Tyrone Hensen, MD;  Location:  [facility];  Service: Cardiovascular;  Laterality: Right;   APPENDECTOMY       BACK SURGERY   1980"s    L4 to L 5 laminectomy   BYPASS GRAFT POPLITEAL TO TIBIAL Right 11/26/2023    Procedure: COMMON FEMORAL TO ANTERIOR TIBIAL ARTERY BYPASS USING GREATER SAPHENOUS VEIN;  Surgeon: Tyrone Hensen, MD;  Location: [facility] OR;  Service: Vascular;  Laterality: Right;   COLONOSCOPY WITH PROPOFOL  N/A 02/06/2016    Procedure: COLONOSCOPY WITH PROPOFOL ;  Surgeon: Garrett Kallman, MD;  Location: [facility];  Service: Endoscopy;  Laterality: N/A;   DIAGNOSTIC LAPAROSCOPY        ENDARTERECTOMY FEMORAL Left 10/24/2021    Procedure: ENDARTERECTOMY FEMORAL WITH PROFUNDAPLASTY;  Surgeon: Tyrone Hensen, MD;  Location: [facility] OR;  Service: Vascular;  Laterality: Left;   ENDARTERECTOMY FEMORAL Right 03/06/2022    Procedure: RIGHT COMMON FEMORAL ENDARTERECTOMY;  Surgeon: Tyrone Hensen, MD;  Location: [facility] OR;  Service: Vascular;  Laterality: Right;   FEMORAL-POPLITEAL BYPASS GRAFT Left 10/24/2021    Procedure: LEFT COMMON FEMORAL- BELOW KNEE POPLITEAL BYPASS;  Surgeon: Tyrone Hensen, MD;  Location: [facility] OR;  Service: Vascular;  Laterality: Left;   FEMORAL-POPLITEAL BYPASS GRAFT Right 03/06/2022    Procedure: RIGHT FEMORAL-POPLITEAL BYPASS WITH GORETEC GRAFT;  Surgeon: Tyrone Hensen, MD;  Location: [facility] OR;  Service: Vascular;  Laterality: Right;  INSERT ARTERIAL LINE   GROIN EXPOSURE Right 11/26/2023    Procedure: REDO RIGHT GROIN EXPOSURE;  Surgeon: Tyrone Hensen, MD;  Location: [facility] OR;  Service: Vascular;  Laterality: Right;   LAPAROSCOPIC APPENDECTOMY N/A 08/13/2018    Procedure: APPENDECTOMY LAPAROSCOPIC;  Surgeon: Juanita Norlander, MD;  Location: [facility] ORS;  Service: General;  Laterality: N/A;   LOWER EXTREMITY ANGIOGRAPHY N/A 01/29/2024    Procedure: Lower Extremity Angiography;  Surgeon: Tyrone Hensen, MD;  Location: [facility] INVASIVE CV LAB;  Service: Cardiovascular;  Laterality: N/A;   LOWER EXTREMITY INTERVENTION N/A 01/29/2024    Procedure: LOWER EXTREMITY INTERVENTION;  Surgeon: Tyrone Hensen, MD;  Location: [facility];  Service: Cardiovascular;  Laterality: N/A;   PATCH ANGIOPLASTY Left 10/24/2021    Procedure: PATCH ANGIOPLASTY;  Surgeon: Tyrone Hensen, MD;  Location: [facility] OR;  Service: Vascular;  Laterality: Left;   PELVIC LYMPH NODE DISSECTION Bilateral 02/08/2019    Procedure: PELVIC LYMPH NODE DISSECTION;  Surgeon: Samson Croak, MD;  Location: [facility] ORS;  Service: Urology;  Laterality: Bilateral;   ROBOT ASSISTED LAPAROSCOPIC  RADICAL PROSTATECTOMY N/A 02/08/2019    Procedure: XI ROBOTIC ASSISTED LAPAROSCOPIC RADICAL PROSTATECTOMY;  Surgeon: Samson Croak, MD;  Location: [facility] ORS;  Service: Urology;  Laterality: N/A;   TRANSFORAMINAL LUMBAR INTERBODY FUSION (TLIF) WITH PEDICLE SCREW FIXATION 1 LEVEL Left 09/12/2020    Procedure: LEFT-SIDED LUMBAR 1 - LUMBAR 2 TRANSFORAMINAL LUMBAR INTERBODY FUSION WITH INSTRUMENTATION AND ALLOGRAFT;  Surgeon: Virl Grimes, MD;  Location: [facility] OR;  Service: Orthopedics;  Laterality: Left;  3C BED   VEIN HARVEST Left 10/24/2021    Procedure: VEIN HARVEST;  Surgeon: Tyrone Hensen, MD;  Location: [facility] OR;  Service: Vascular;  Laterality: Left;   VEIN HARVEST Right 11/26/2023    Procedure: VEIN HARVEST OF RIGHT GREATER SAPHANEOUS VEIN;  Surgeon: Tyrone Hensen, MD;  Location: [facility] OR;  Service: Vascular;  Laterality: Right;   WRIST SURGERY Right      x 2               Family  History  Problem Relation Age of Onset   Colon cancer Maternal Grandmother     Colon cancer Maternal Great-grandmother     Prostate cancer Neg Hx            SOCIAL HISTORY: Social History         Socioeconomic History   Marital status: Married      Spouse name: Not on file   Number of children: 6   Years of education: Not on file   Highest education level: Not on file  Occupational History   Occupation: Courtyard  Tobacco Use   Smoking status: Former      Current packs/day: 0.00      Types: Cigarettes      Quit date: 02/02/2022      Years since quitting: 2.0   Smokeless tobacco: Never   Tobacco comments:      Smoked 2 pkgs a day for 51 years  Vaping Use   Vaping status: Never Used  Substance and Sexual Activity   Alcohol use: Not Currently      Comment: occasional   Drug use: No   Sexual activity: Not Currently  Other Topics Concern   Not on file  Social History Narrative    Married for 46 years with 3 sons and 3 daughters. Smokes a ppd.     Social Drivers of Cabin crew Strain: Not on file  Food Insecurity: No Food Insecurity (11/27/2023)    Hunger Vital Sign     Worried About Running Out of Food in the Last Year: Never true     Ran Out of Food in the Last Year: Never true  Transportation Needs: No Transportation Needs (11/27/2023)    PRAPARE - Therapist, art (Medical): No     Lack of Transportation (Non-Medical): No  Physical Activity: Not on file  Stress: Not on file  Social Connections: Moderately Isolated (11/27/2023)    Social Connection and Isolation Panel [NHANES]     Frequency of Communication with Friends and Family: More than three times a week     Frequency of Social Gatherings with Friends and Family: Once a week     Attends Religious Services: Never     Database administrator or Organizations: No     Attends Banker Meetings: Never     Marital Status: Married  Catering manager Violence: Not At Risk (11/27/2023)    Humiliation, Afraid, Rape, and Kick questionnaire     Fear of Current or Ex-Partner: No     Emotionally Abused: No     Physically Abused: No     Sexually Abused: No      Allergies       Allergies  Allergen Reactions   Neurontin [Gabapentin] Itching and Other (See Comments)      Confusion and sedation.   Celexa [Citalopram Hydrobromide]        restless leg/insomnia worse   Topamax [Topiramate]        numbness in hand              Current Outpatient Medications  Medication Sig Dispense Refill   albuterol  (VENTOLIN  HFA) 108 (90 Base) MCG/ACT inhaler Inhale 1-2 puffs into the lungs every 6 (six) hours as needed for wheezing or shortness of breath.       aspirin  325 MG tablet Take 325 mg by mouth every evening.       atorvastatin  (  LIPITOR ) 80 MG tablet Take 1 tablet (80 mg total) by mouth daily. 90 tablet 3   buPROPion  (WELLBUTRIN  XL) 150 MG 24 hr tablet Take 150 mg by mouth daily.       clopidogrel  (PLAVIX ) 75 MG tablet Take 1 tablet (75 mg total) by mouth  daily. 30 tablet 6   ezetimibe  (ZETIA ) 10 MG tablet Take 1 tablet (10 mg total) by mouth daily. 30 tablet 11   ipratropium-albuterol  (DUONEB) 0.5-2.5 (3) MG/3ML SOLN Take 3 mLs by nebulization 2 (two) times daily. (Patient taking differently: Take 3 mLs by nebulization 2 (two) times daily as needed (SOB/Wheeezing).) 360 mL 0   Multiple Vitamin (MULTIVITAMIN WITH MINERALS) TABS tablet Take 1 tablet by mouth every evening. Centrum Silver       oxymetazoline  (AFRIN) 0.05 % nasal spray Place 1 spray into both nostrils 2 (two) times daily as needed for congestion.          No current facility-administered medications for this visit.        REVIEW OF SYSTEMS:  [X]  denotes positive finding, [ ]  denotes negative finding Cardiac   Comments:  Chest pain or chest pressure:      Shortness of breath upon exertion:      Short of breath when lying flat:      Irregular heart rhythm:             Vascular      Pain in calf, thigh, or hip brought on by ambulation: x right  Pain in feet at night that wakes you up from your sleep:       Blood clot in your veins:      Leg swelling:              Pulmonary      Oxygen  at home:      Productive cough:       Wheezing:              Neurologic      Sudden weakness in arms or legs:       Sudden numbness in arms or legs:       Sudden onset of difficulty speaking or slurred speech:      Temporary loss of vision in one eye:       Problems with dizziness:              Gastrointestinal      Blood in stool:       Vomited blood:              Genitourinary      Burning when urinating:       Blood in urine:             Psychiatric      Major depression:              Hematologic      Bleeding problems:      Problems with blood clotting too easily:             Skin      Rashes or ulcers:             Constitutional      Fever or chills:          PHYSICAL EXAM: There were no vitals filed for this visit.     GENERAL: The patient is a  well-nourished male, in no acute distress. The vital signs are documented above. CARDIAC: There is a regular rate  and rhythm.  VASCULAR:  Bilateral femoral pulses palpable Palpable pulse in right fem to AT bypass graft with a palpable AT pulse at the ankle   DATA:    ABIs today are improved on the right from 0.6 to 0.76 after recent intervention   Lower extremity arterial duplex does show a patent right common femoral to AT bypass.  There is a mid graft stenosis with a velocity of 333 although the distal anastomosis looks much better with a velocity of 233 improved from 510.     Assessment/Plan:   68 y.o. male, with history of COPD, hypertension, prostate cancer, tobacco abuse that presents for 1 month follow-up after recent right leg bypass intervention.  On 01/29/2024 he underwent angioplasty of the distal bypass including the anastomosis onto the AT.  Prior to this he underwent right common femoral artery to AT bypass with great saphenous vein on 11/26/2023 for an occluded right leg bypass.   Although the distal bypass anastomotic stenosis looks much better he does have a mid graft stenosis now based on duplex.  He is endorsing short distance claudication symptoms in his right leg.  Given he has already had redo bypass in the right leg with limited options moving forward I think we should be very aggressive.  I have recommended returning to the Cath Lab for aortogram, lower extremity arteriogram and possible intervention.  Risk benefits again discussed.  Will get scheduled in the Cath Lab.  Discussed this to maintain bypass patency and hopefully improve his claudication.   Tyrone Hensen, MD [facility] Office: [phone]

## 2024-03-18 NOTE — Progress Notes (Signed)
Client up and walked and tolerated well; left groin stable, no bleeding or hematoma 

## 2024-03-18 NOTE — Progress Notes (Signed)
 Dr Fulton Job in to see client and notified of client c/o back pain; Dr Fulton Job checked client's left groin

## 2024-03-18 NOTE — Progress Notes (Addendum)
 Site Area: left femoral   Site prior to removal: level 0, stable, clean/dry/intact  Pressure applied for: 40 minutes   Bedrest beginning at: 1515  Manual: yes  Patient status during pull: stable  Post pull groin site: level 0, stable, clean/dry/intact  Post pull instructions given: yes  Post pull pulses present: +2 DP on left and right   Dressing applied: gauze and tegaderm   Comments: Elinor Guardian, RN pulled sheath with assistance from [employer], RN and [place], Charity fundraiser in holding area

## 2024-03-18 NOTE — Op Note (Signed)
 Patient name: Tyrone [name]. MRN: [id number] DOB: May 19, 1956 Sex: male  03/18/2024 Pre-operative Diagnosis: High grade >70% stenosis right common femoral to AT bypass Post-operative diagnosis:  Same Surgeon:  Young Hensen, MD Procedure Performed: 1.  Ultrasound-guided access left common femoral artery 2.  Aortogram with catheter selection of aorta 3.  Right lower extremity arteriogram including catheter selection of the right common femoral to AT bypass graft 4.  Angioplasty of the mid to distal right common femoral to AT bypass vein graft including the distal anastomosis (4 mm Ranger) 5.  Stent of the right common femoral to AT bypass vein graft in the mid to distal segment (5 mm x 5 cm Viabahn and 5 mm x 10 cm Viabahn) 6.  76 minutes of monitored moderate conscious sedation time  Indications: Patient is a 68 year old male that has undergone redo right lower extremity bypass from the common femoral to anterior tibial with vein for critical limb ischemia with rest pain.  Recent intervention for mid bypass graft and distal anastomotic stenosis.  Now with recurrent symptoms and recurrent high-grade stenosis in the mid graft approximately 1 month after recent intervention.  Presents for redo angiogram and possible intervention after risks benefits discussed.  Findings:   Ultrasound-guided access left common femoral artery.  Aortogram showed patent renals with patent infrarenal aorta and patent iliacs without flow-limiting stenosis.  On the right, common femoral and profunda are patent.  The right common femoral to anterior tibial bypass has a moderate to high-grade stenosis in the mid to distal segment around the knee with a second apparent high-grade stenosis at the anastomosis >70%.  Ultimately was able to get a wire down through the right common femoral to anterior tibial bypass.  The mid to distal bypass was then treated with a 4 mm drug-coated balloon Ranger in multiple segments.   We then had a perforation in the vein graft in the distal segment around the knee in a diseased segment of the vein that I tried to treat with prolonged balloon inflation.  There was still extravasation.  I then elected to cover this with initial 5 mm x 5 cm Viabahn.  Ultimately had to extend with a second 5 mm x 10 cm Viabahn.  Widely patent stent at completion with preserved runoff.  I did treat the distal anastomosis with a 4 mm angioplasty balloon as well.  Preserved runoff in the AT with retrograde flow into the PT peroneal and widely patent stents at completion.  No extravasation at completion.   Procedure:  The patient was identified in the holding area and taken to room 8.  The patient was then placed supine on the table and prepped and draped in the usual sterile fashion.  A time out was called.  Patient received Versed  and fentanyl  for conscious moderate sedation.  Vital signs were monitored including heart rate, respiratory rate, oxygenation and blood pressure.  I was present for all moderate sedation.  Ultrasound was used to evaluate the left common femoral artery, it was patent, an image was saved.  This was accessed with a micro access needle, placed a microwire, and micro sheath.  I then placed a Bentson wire and a short 5 [place] sheath.  Advanced the Omni Flush catheter into the abdominal aorta and shot aortogram.  I then crossed the aortic bifurcation and got my catheter to the right external iliac.  Right lower extremity runoff was obtained.  We noted the stenosis in the bypass graft including  the distal anastomosis.  I then upsized to a  5 French sheath in the left groin over the aortic bifurcation.  The patient was given 100 units per kilogram IV heparin .  I then used a V18 wire to guide down the bypass through the distal anastomosis.  I then selected a 4 mm x 100 mm drug-coated Ranger and this was inflated in multiple segments of stenosis in the mid to distal bypass graft in the right leg.   Imaging then showed perforation although the balloon was sized appropriately.  This was in the diseased and sclerotic segment of the vein.  I tried to do a prolonged balloon inflation with a 4 mm balloon for 2 minutes and still had active extravasation here.  I elected to treat this with a covered stent.  I upsized to a 6 [place] catapult sheath in the left groin.  I initially deployed a 5 mm x 5 cm Viabahn in the vein graft at the perforation.  We still had active extravasation even after additional angioplasty at the proximal distal landing zones.  Ultimately elected to extend with a 5 mm x 10 cm Viabahn below the knee in the vein graft with excellent results.  Widely patent stents at completion with no extravasation and preserved runoff in the AT with retrograde filling into the PT peroneal.  Wires and catheters were removed after exchanged for a short 6 French sheath in the left groin.  Taken to holding for sheath removal.    Plan: Continue aspirin  Plavix  statin.  Arrange follow-up in 1 month with right leg arterial duplex and ABIs.  Young Hensen, MD [facility] [place] Office: [phone]

## 2024-04-02 ENCOUNTER — Other Ambulatory Visit (HOSPITAL_COMMUNITY): Payer: Self-pay

## 2024-04-07 ENCOUNTER — Other Ambulatory Visit: Payer: Self-pay

## 2024-04-07 DIAGNOSIS — I70221 Atherosclerosis of native arteries of extremities with rest pain, right leg: Secondary | ICD-10-CM

## 2024-04-22 ENCOUNTER — Ambulatory Visit (HOSPITAL_COMMUNITY)
Admission: RE | Admit: 2024-04-22 | Discharge: 2024-04-22 | Disposition: A | Discharge status: Home / Self Care | Source: Ambulatory Visit | Attending: Vascular Surgery | Admitting: Vascular Surgery

## 2024-04-22 ENCOUNTER — Ambulatory Visit (HOSPITAL_BASED_OUTPATIENT_CLINIC_OR_DEPARTMENT_OTHER)
Admit: 2024-04-22 | Discharge: 2024-04-22 | Disposition: A | Discharge status: Home / Self Care | Attending: Vascular Surgery | Admitting: Vascular Surgery

## 2024-04-22 DIAGNOSIS — I70221 Atherosclerosis of native arteries of extremities with rest pain, right leg: Secondary | ICD-10-CM

## 2024-04-22 LAB — VAS US ABI WITH/WO TBI
Left ABI: 0.93
Right ABI: 0.9

## 2024-04-27 ENCOUNTER — Ambulatory Visit: Attending: Vascular Surgery | Admitting: Physician Assistant

## 2024-04-27 VITALS — BP 166/84 | HR 111 | Temp 98.4°F | Resp 18 | Ht 70.0 in | Wt 231.3 lb

## 2024-04-27 DIAGNOSIS — I739 Peripheral vascular disease, unspecified: Secondary | ICD-10-CM

## 2024-04-27 DIAGNOSIS — I70221 Atherosclerosis of native arteries of extremities with rest pain, right leg: Secondary | ICD-10-CM

## 2024-04-27 NOTE — Progress Notes (Signed)
 Office Note     CC:  follow up Requesting Provider:  Ransom Other, MD  HPI: Tyrone [name]. is a 68 y.o. (04-Jul-1956) male status post right lower extremity angiogram with angioplasty and stenting of the mid to distal right common femoral to AT bypass graft by Dr. Gretta on 03/18/2024.  He also required angioplasty of the distal anastomosis on 01/29/2024.  Common femoral to AT bypass with vein was initially performed on 11/26/2023 by Dr. Gretta.  After most recent intervention, claudication of the right leg has nearly resolved.  He denies any rest pain or tissue loss.  He is on aspirin , Plavix , statin daily.  He continues to smoke on a daily basis.   Past Medical History:  Diagnosis Date   Arthritis    hands, knees, back   COPD (chronic obstructive pulmonary disease) (HCC)    Depression    Diskitis 02/28/2021   Hardware complicating wound infection (HCC) 02/28/2021   Headache    migraines   Hypertension    Legionella pneumonia (HCC) 02/28/2021   Neuromuscular disorder (HCC)    hands   Peripheral vascular disease (HCC)    Pre-diabetes    Prostate cancer (HCC)    Smoker 11/05/2021   Vaccine counseling 11/05/2021    Past Surgical History:  Procedure Laterality Date   ABDOMINAL AORTOGRAM N/A 01/29/2024   Procedure: ABDOMINAL AORTOGRAM;  Surgeon: Gretta Lonni PARAS, MD;  Location: [facility];  Service: Cardiovascular;  Laterality: N/A;   ABDOMINAL AORTOGRAM N/A 03/18/2024   Procedure: ABDOMINAL AORTOGRAM;  Surgeon: Gretta Lonni PARAS, MD;  Location: [facility];  Service: Cardiovascular;  Laterality: N/A;   ABDOMINAL AORTOGRAM W/LOWER EXTREMITY Bilateral 10/18/2021   Procedure: ABDOMINAL AORTOGRAM W/LOWER EXTREMITY;  Surgeon: Gretta Lonni PARAS, MD;  Location: [facility];  Service: Cardiovascular;  Laterality: Bilateral;   ABDOMINAL AORTOGRAM W/LOWER EXTREMITY Right 02/28/2022   Procedure: ABDOMINAL AORTOGRAM W/LOWER EXTREMITY;  Surgeon: Gretta Lonni PARAS, MD;  Location: [facility];  Service: Cardiovascular;  Laterality: Right;   ABDOMINAL AORTOGRAM W/LOWER EXTREMITY Right 10/30/2023   Procedure: ABDOMINAL AORTOGRAM W/LOWER EXTREMITY;  Surgeon: Gretta Lonni PARAS, MD;  Location: [facility];  Service: Cardiovascular;  Laterality: Right;   APPENDECTOMY     BACK SURGERY  1980s   L4 to L 5 laminectomy   BYPASS GRAFT POPLITEAL TO TIBIAL Right 11/26/2023   Procedure: COMMON FEMORAL TO ANTERIOR TIBIAL ARTERY BYPASS USING GREATER SAPHENOUS VEIN;  Surgeon: Gretta Lonni PARAS, MD;  Location: [facility] OR;  Service: Vascular;  Laterality: Right;   COLONOSCOPY WITH PROPOFOL  N/A 02/06/2016   Procedure: COLONOSCOPY WITH PROPOFOL ;  Surgeon: Gladis MARLA Louder, MD;  Location: [facility];  Service: Endoscopy;  Laterality: N/A;   DIAGNOSTIC LAPAROSCOPY     ENDARTERECTOMY FEMORAL Left 10/24/2021   Procedure: ENDARTERECTOMY FEMORAL WITH PROFUNDAPLASTY;  Surgeon: Gretta Lonni PARAS, MD;  Location: [facility] OR;  Service: Vascular;  Laterality: Left;   ENDARTERECTOMY FEMORAL Right 03/06/2022   Procedure: RIGHT COMMON FEMORAL ENDARTERECTOMY;  Surgeon: Gretta Lonni PARAS, MD;  Location: [facility] OR;  Service: Vascular;  Laterality: Right;   FEMORAL-POPLITEAL BYPASS GRAFT Left 10/24/2021   Procedure: LEFT COMMON FEMORAL- BELOW KNEE POPLITEAL BYPASS;  Surgeon: Gretta Lonni PARAS, MD;  Location: [facility] OR;  Service: Vascular;  Laterality: Left;   FEMORAL-POPLITEAL BYPASS GRAFT Right 03/06/2022   Procedure: RIGHT FEMORAL-POPLITEAL BYPASS WITH GORETEC GRAFT;  Surgeon: Gretta Lonni PARAS, MD;  Location: [facility] OR;  Service: Vascular;  Laterality: Right;  INSERT  ARTERIAL LINE   GROIN EXPOSURE Right 11/26/2023   Procedure: REDO RIGHT GROIN EXPOSURE;  Surgeon: Gretta Lonni PARAS, MD;  Location: [facility] OR;  Service: Vascular;  Laterality: Right;   LAPAROSCOPIC APPENDECTOMY N/A 08/13/2018   Procedure: APPENDECTOMY LAPAROSCOPIC;  Surgeon: Ethyl Lenis, MD;  Location: [facility];  Service:  General;  Laterality: N/A;   LOWER EXTREMITY ANGIOGRAPHY N/A 01/29/2024   Procedure: Lower Extremity Angiography;  Surgeon: Gretta Lonni PARAS, MD;  Location: [facility];  Service: Cardiovascular;  Laterality: N/A;   LOWER EXTREMITY ANGIOGRAPHY N/A 03/18/2024   Procedure: Lower Extremity Angiography;  Surgeon: Gretta Lonni PARAS, MD;  Location: [facility] CV [facility];  Service: Cardiovascular;  Laterality: N/A;   LOWER EXTREMITY INTERVENTION N/A 01/29/2024   Procedure: LOWER EXTREMITY INTERVENTION;  Surgeon: Gretta Lonni PARAS, MD;  Location: [facility];  Service: Cardiovascular;  Laterality: N/A;   LOWER EXTREMITY INTERVENTION N/A 03/18/2024   Procedure: LOWER EXTREMITY INTERVENTION;  Surgeon: Gretta Lonni PARAS, MD;  Location: [facility];  Service: Cardiovascular;  Laterality: N/A;   PATCH ANGIOPLASTY Left 10/24/2021   Procedure: PATCH ANGIOPLASTY;  Surgeon: Gretta Lonni PARAS, MD;  Location: [facility] OR;  Service: Vascular;  Laterality: Left;   PELVIC LYMPH NODE DISSECTION Bilateral 02/08/2019   Procedure: PELVIC LYMPH NODE DISSECTION;  Surgeon: Carolee Sherwood JONETTA DOUGLAS, MD;  Location: [facility];  Service: Urology;  Laterality: Bilateral;   ROBOT ASSISTED LAPAROSCOPIC RADICAL PROSTATECTOMY N/A 02/08/2019   Procedure: XI ROBOTIC ASSISTED LAPAROSCOPIC RADICAL PROSTATECTOMY;  Surgeon: Carolee Sherwood JONETTA DOUGLAS, MD;  Location: [facility];  Service: Urology;  Laterality: N/A;   TRANSFORAMINAL LUMBAR INTERBODY FUSION (TLIF) WITH PEDICLE SCREW FIXATION 1 LEVEL Left 09/12/2020   Procedure: LEFT-SIDED LUMBAR 1 - LUMBAR 2 TRANSFORAMINAL LUMBAR INTERBODY FUSION WITH INSTRUMENTATION AND ALLOGRAFT;  Surgeon: Beuford Anes, MD;  Location: [facility];  Service: Orthopedics;  Laterality: Left;  3C BED   VEIN HARVEST Left 10/24/2021   Procedure: VEIN HARVEST;  Surgeon: Gretta Lonni PARAS, MD;  Location: [facility] OR;  Service: Vascular;  Laterality: Left;   VEIN HARVEST Right 11/26/2023   Procedure: VEIN HARVEST OF RIGHT  GREATER SAPHANEOUS VEIN;  Surgeon: Gretta Lonni PARAS, MD;  Location: [facility] OR;  Service: Vascular;  Laterality: Right;   WRIST SURGERY Right    x 2     Social History   Socioeconomic History   Marital status: Married    Spouse name: Not on file   Number of children: 6   Years of education: Not on file   Highest education level: Not on file  Occupational History   Occupation: Courtyard  Tobacco Use   Smoking status: Former    Current packs/day: 0.00    Types: Cigarettes    Quit date: 02/02/2022    Years since quitting: 2.2   Smokeless tobacco: Never   Tobacco comments:    Smoked 2 pkgs a day for 51 years  Vaping Use   Vaping status: Never Used  Substance and Sexual Activity   Alcohol use: Not Currently    Comment: occasional   Drug use: No   Sexual activity: Not Currently  Other Topics Concern   Not on file  Social History Narrative   Married for 46 years with 3 sons and 3 daughters. Smokes a ppd.    Social Drivers of Corporate investment banker Strain: Not on file  Food Insecurity: No Food Insecurity (11/27/2023)   Hunger Vital Sign    Worried About Running Out of Food  in the Last Year: Never true    Ran Out of Food in the Last Year: Never true  Transportation Needs: No Transportation Needs (11/27/2023)   PRAPARE - Administrator, Civil Service (Medical): No    Lack of Transportation (Non-Medical): No  Physical Activity: Not on file  Stress: Not on file  Social Connections: Moderately Isolated (11/27/2023)   Social Connection and Isolation Panel    Frequency of Communication with Friends and Family: More than three times a week    Frequency of Social Gatherings with Friends and Family: Once a week    Attends Religious Services: Never    Database administrator or Organizations: No    Attends Banker Meetings: Never    Marital Status: Married  Catering manager Violence: Not At Risk (11/27/2023)   Humiliation, Afraid, Rape, and Kick  questionnaire    Fear of Current or Ex-Partner: No    Emotionally Abused: No    Physically Abused: No    Sexually Abused: No    Family History  Problem Relation Age of Onset   Colon cancer Maternal Grandmother    Colon cancer Maternal Great-grandmother    Prostate cancer Neg Hx     Current Outpatient Medications  Medication Sig Dispense Refill   albuterol  (VENTOLIN  HFA) 108 (90 Base) MCG/ACT inhaler Inhale 1-2 puffs into the lungs every 6 (six) hours as needed for wheezing or shortness of breath.     aspirin  325 MG tablet Take 325 mg by mouth every evening.     atorvastatin  (LIPITOR ) 80 MG tablet Take 1 tablet (80 mg total) by mouth daily. 90 tablet 3   buPROPion  (WELLBUTRIN  XL) 150 MG 24 hr tablet Take 150 mg by mouth daily.     clopidogrel  (PLAVIX ) 75 MG tablet Take 1 tablet (75 mg total) by mouth daily. 30 tablet 6   ezetimibe  (ZETIA ) 10 MG tablet Take 1 tablet (10 mg total) by mouth daily. 30 tablet 11   ipratropium-albuterol  (DUONEB) 0.5-2.5 (3) MG/3ML SOLN Take 3 mLs by nebulization 2 (two) times daily. (Patient taking differently: Take 3 mLs by nebulization 2 (two) times daily as needed (SOB/Wheeezing).) 360 mL 0   JARDIANCE 25 MG TABS tablet Take 25 mg by mouth daily.     Multiple Vitamin (MULTIVITAMIN WITH MINERALS) TABS tablet Take 1 tablet by mouth every evening. Centrum Silver     oxymetazoline  (AFRIN) 0.05 % nasal spray Place 1 spray into both nostrils 2 (two) times daily as needed for congestion.     No current facility-administered medications for this visit.    Allergies  Allergen Reactions   Neurontin [Gabapentin] Itching and Other (See Comments)    Confusion and sedation.   Celexa [Citalopram Hydrobromide]     restless leg/insomnia worse   Topamax [Topiramate]     numbness in hand     REVIEW OF SYSTEMS:   [X]  denotes positive finding, [ ]  denotes negative finding Cardiac  Comments:  Chest pain or chest pressure:    Shortness of breath upon exertion:     Short of breath when lying flat:    Irregular heart rhythm:        Vascular    Pain in calf, thigh, or hip brought on by ambulation:    Pain in feet at night that wakes you up from your sleep:     Blood clot in your veins:    Leg swelling:         Pulmonary  Oxygen  at home:    Productive cough:     Wheezing:         Neurologic    Sudden weakness in arms or legs:     Sudden numbness in arms or legs:     Sudden onset of difficulty speaking or slurred speech:    Temporary loss of vision in one eye:     Problems with dizziness:         Gastrointestinal    Blood in stool:     Vomited blood:         Genitourinary    Burning when urinating:     Blood in urine:        Psychiatric    Major depression:         Hematologic    Bleeding problems:    Problems with blood clotting too easily:        Skin    Rashes or ulcers:        Constitutional    Fever or chills:      PHYSICAL EXAMINATION:  Vitals:   04/27/24 1256  BP: (!) 166/84  Pulse: (!) 111  Resp: 18  Temp: 98.4 F (36.9 C)  TempSrc: Temporal  Weight: 231 lb 4.8 oz (104.9 kg)  Height: 5' 10 (1.778 m)    General:  WDWN in NAD; vital signs documented above Gait: Not observed HENT: WNL, normocephalic Pulmonary: normal non-labored breathing Cardiac: regular HR Abdomen: soft, NT, no masses Skin: without rashes Vascular Exam/Pulses: palpable R DP pulse Extremities: without ischemic changes, without Gangrene , without cellulitis; without open wounds; left groin access site without hematoma Musculoskeletal: no muscle wasting or atrophy  Neurologic: A&O X 3 Psychiatric:  The pt has Normal affect.   Non-Invasive Vascular Imaging:   Right leg bypass widely patent by duplex  ABI/TBIToday's ABIToday's TBIPrevious ABIPrevious TBI  +-------+-----------+-----------+------------+------------+  Right 0.90       0.91       0.76        0.53           +-------+-----------+-----------+------------+------------+  Left  0.93       0.82       0.93        0.72         ASSESSMENT/PLAN:: 68 y.o. male status post angioplasty and stenting of the mid to distal segment of the right leg bypass  Right leg well-perfused with palpable DP pulse.  Duplex demonstrates a widely patent bypass without any hemodynamically significant stenosis.  ABI and TBI has improved since intervention.  He will continue his aspirin , Plavix , statin daily.  We again discussed smoking cessation.  We will repeat bypass imaging and ABI in 3 months.  If at that time bypass is stable we can move to 35-month interval surveillance.   Donnice Sender, PA-C [facility] [phone]  Clinic MD:   Magda

## 2024-04-28 ENCOUNTER — Other Ambulatory Visit: Payer: Self-pay

## 2024-04-28 DIAGNOSIS — I739 Peripheral vascular disease, unspecified: Secondary | ICD-10-CM

## 2024-06-08 ENCOUNTER — Other Ambulatory Visit (HOSPITAL_COMMUNITY): Payer: Self-pay

## 2024-07-14 ENCOUNTER — Other Ambulatory Visit: Payer: Self-pay | Admitting: Physician Assistant

## 2024-07-14 ENCOUNTER — Other Ambulatory Visit: Payer: Self-pay

## 2024-07-27 ENCOUNTER — Ambulatory Visit (HOSPITAL_COMMUNITY)
Admission: RE | Admit: 2024-07-27 | Discharge: 2024-07-27 | Disposition: A | Discharge status: Home / Self Care | Source: Ambulatory Visit | Attending: Vascular Surgery | Admitting: Vascular Surgery

## 2024-07-27 ENCOUNTER — Ambulatory Visit (INDEPENDENT_AMBULATORY_CARE_PROVIDER_SITE_OTHER): Admitting: Physician Assistant

## 2024-07-27 ENCOUNTER — Ambulatory Visit (HOSPITAL_BASED_OUTPATIENT_CLINIC_OR_DEPARTMENT_OTHER)
Admission: RE | Admit: 2024-07-27 | Discharge: 2024-07-27 | Disposition: A | Discharge status: Home / Self Care | Source: Ambulatory Visit | Attending: Vascular Surgery | Admitting: Vascular Surgery

## 2024-07-27 VITALS — BP 138/96 | HR 89 | Temp 98.1°F | Resp 18 | Ht 70.0 in | Wt 222.2 lb

## 2024-07-27 DIAGNOSIS — I739 Peripheral vascular disease, unspecified: Secondary | ICD-10-CM

## 2024-07-27 DIAGNOSIS — T82858D Stenosis of vascular prosthetic devices, implants and grafts, subsequent encounter: Secondary | ICD-10-CM | POA: Diagnosis not present

## 2024-07-27 LAB — VAS US ABI WITH/WO TBI
Left ABI: 1.02
Right ABI: 0.54

## 2024-07-27 NOTE — Progress Notes (Signed)
 Office Note     CC:  follow up Requesting Provider:  Ransom Other, MD  HPI: Tyrone [name]. is a 68 y.o. (1956-05-31) male who presents for surveillance of PAD.  He is well-known to [facility] with numerous procedures in the past including:  Left femoral endarterectomy with bovine patch angioplasty and left common femoral to below the knee popliteal bypass with PTFE on 10/24/2021 Right common femoral to below the knee popliteal bypass with PTFE on 03/06/2022 Redo right common femoral artery to anterior tibial bypass with vein due to an occluded right leg bypass on 11/26/2023 Right leg angiogram with angioplasty of the mid bypass as well as distal anastomosis onto the right anterior tibial artery on 01/29/2024 Right leg angiogram with angioplasty of the mid to distal bypass with stenting of the mid to distal segment on 03/18/2024  He returns to clinic today after being seen in the office 3 months ago.  Claudication symptoms of the right calf returned about 2 months ago.  He denies any rest pain or tissue loss at this time.  He can walk about 300 feet before having to stop and rest.  He continues to smoke 1 pack a day.  He denies any symptoms of the left lower extremity.     Past Medical History:  Diagnosis Date   Arthritis    hands, knees, back   COPD (chronic obstructive pulmonary disease) (HCC)    Depression    Diskitis 02/28/2021   Hardware complicating wound infection 02/28/2021   Headache    migraines   Hypertension    Legionella pneumonia (HCC) 02/28/2021   Neuromuscular disorder (HCC)    hands   Peripheral vascular disease    Pre-diabetes    Prostate cancer (HCC)    Smoker 11/05/2021   Vaccine counseling 11/05/2021    Past Surgical History:  Procedure Laterality Date   ABDOMINAL AORTOGRAM N/A 01/29/2024   Procedure: ABDOMINAL AORTOGRAM;  Surgeon: Gretta Lonni PARAS, MD;  Location: [facility];  Service: Cardiovascular;  Laterality: N/A;   ABDOMINAL AORTOGRAM N/A  03/18/2024   Procedure: ABDOMINAL AORTOGRAM;  Surgeon: Gretta Lonni PARAS, MD;  Location: [facility];  Service: Cardiovascular;  Laterality: N/A;   ABDOMINAL AORTOGRAM W/LOWER EXTREMITY Bilateral 10/18/2021   Procedure: ABDOMINAL AORTOGRAM W/LOWER EXTREMITY;  Surgeon: Gretta Lonni PARAS, MD;  Location: [facility];  Service: Cardiovascular;  Laterality: Bilateral;   ABDOMINAL AORTOGRAM W/LOWER EXTREMITY Right 02/28/2022   Procedure: ABDOMINAL AORTOGRAM W/LOWER EXTREMITY;  Surgeon: Gretta Lonni PARAS, MD;  Location: [facility];  Service: Cardiovascular;  Laterality: Right;   ABDOMINAL AORTOGRAM W/LOWER EXTREMITY Right 10/30/2023   Procedure: ABDOMINAL AORTOGRAM W/LOWER EXTREMITY;  Surgeon: Gretta Lonni PARAS, MD;  Location: [facility];  Service: Cardiovascular;  Laterality: Right;   APPENDECTOMY     BACK SURGERY  1980s   L4 to L 5 laminectomy   BYPASS GRAFT POPLITEAL TO TIBIAL Right 11/26/2023   Procedure: COMMON FEMORAL TO ANTERIOR TIBIAL ARTERY BYPASS USING GREATER SAPHENOUS VEIN;  Surgeon: Gretta Lonni PARAS, MD;  Location: [facility] OR;  Service: Vascular;  Laterality: Right;   COLONOSCOPY WITH PROPOFOL  N/A 02/06/2016   Procedure: COLONOSCOPY WITH PROPOFOL ;  Surgeon: Gladis MARLA Louder, MD;  Location: [facility];  Service: Endoscopy;  Laterality: N/A;   DIAGNOSTIC LAPAROSCOPY     ENDARTERECTOMY FEMORAL Left 10/24/2021   Procedure: ENDARTERECTOMY FEMORAL WITH PROFUNDAPLASTY;  Surgeon: Gretta Lonni PARAS, MD;  Location[facility] OR;  Service: Vascular;  Laterality: Left;  ENDARTERECTOMY FEMORAL Right 03/06/2022   Procedure: RIGHT COMMON FEMORAL ENDARTERECTOMY;  Surgeon: Gretta Lonni PARAS, MD;  Location: [facility] OR;  Service: Vascular;  Laterality: Right;   FEMORAL-POPLITEAL BYPASS GRAFT Left 10/24/2021   Procedure: LEFT COMMON FEMORAL- BELOW KNEE POPLITEAL BYPASS;  Surgeon: Gretta Lonni PARAS, MD;  Location: [facility];  Service: Vascular;  Laterality: Left;   FEMORAL-POPLITEAL  BYPASS GRAFT Right 03/06/2022   Procedure: RIGHT FEMORAL-POPLITEAL BYPASS WITH GORETEC GRAFT;  Surgeon: Gretta Lonni PARAS, MD;  Location: [facility];  Service: Vascular;  Laterality: Right;  INSERT ARTERIAL LINE   GROIN EXPOSURE Right 11/26/2023   Procedure: REDO RIGHT GROIN EXPOSURE;  Surgeon: Gretta Lonni PARAS, MD;  Location: [facility] OR;  Service: Vascular;  Laterality: Right;   LAPAROSCOPIC APPENDECTOMY N/A 08/13/2018   Procedure: APPENDECTOMY LAPAROSCOPIC;  Surgeon: Ethyl Lenis, MD;  Location: [facility];  Service: General;  Laterality: N/A;   LOWER EXTREMITY ANGIOGRAPHY N/A 01/29/2024   Procedure: Lower Extremity Angiography;  Surgeon: Gretta Lonni PARAS, MD;  Location: [facility] CV [facility];  Service: Cardiovascular;  Laterality: N/A;   LOWER EXTREMITY ANGIOGRAPHY N/A 03/18/2024   Procedure: Lower Extremity Angiography;  Surgeon: Gretta Lonni PARAS, MD;  Location: [facility] INVAS[facility] CV LAB;  Service: Cardiovascular;  Laterality: N/A;   LOWER EXTREMITY INTERVENTION N/A 01/29/2024   Procedure: LOWER EXTREMITY INTERVENTION;  Surgeon: Gretta Lonni PARAS, MD;  [facility]: [facility];  Service: Cardiovascular;  Laterality: N/A;   LOWER EXTREMITY INTERVENTION N/A 03/18/2024   Procedure: LOWER EXTREMITY INTERVENTION;  Surgeon: Gretta Lonni PARAS, MD;  [facility]: [facility];  Service: Cardiovascular;  Laterality: N/A;   PATCH ANGIOPLASTY Left 10/24/2021   Procedure: PATCH ANGIOPLASTY;  Surgeon: Gretta Lonni PARAS, MD;  Location: [facility] OR;  Service: Vascular;  Laterality: Left;   PELVIC LYMPH NODE DISSECTION Bilateral 02/08/2019   Procedure: PELVIC LYMPH NODE DISSECTION;  Surgeon: Carolee Sherwood JONETTA DOUGLAS, MD;  Location: [facility];  Service: Urology;  Laterality: Bilateral;   ROBOT ASSISTED LAPAROSCOPIC RADICAL PROSTATECTOMY N/A 02/08/2019   Procedure: XI ROBOTIC ASSISTED LAPAROSCOPIC RADICAL PROSTATECTOMY;  Surgeon: Carolee Sherwood JONETTA DOUGLAS, MD;  Location: [facility];  Service: Urology;  Laterality: N/A;   TRANSFORAMINAL  LUMBAR INTERBODY FUSION (TLIF) WITH PEDICLE SCREW FIXATION 1 LEVEL Left 09/12/2020   Procedure: LEFT-SIDED LUMBAR 1 - LUMBAR 2 TRANSFORAMINAL LUMBAR INTERBODY FUSION WITH INSTRUMENTATION AND ALLOGRAFT;  Surgeon: Beuford Anes, MD;  Location: [facility];  Service: Orthopedics;  Laterality: Left;  3C BED   VEIN HARVEST Left 10/24/2021   Procedure: VEIN HARVEST;  Surgeon: Gretta Lonni PARAS, MD;  Location: [facility] OR;  Service: Vascular;  Laterality: Left;   VEIN HARVEST Right 11/26/2023   Procedure: VEIN HARVEST OF RIGHT GREATER SAPHANEOUS VEIN;  Surgeon: Gretta Lonni PARAS, MD;  Location: [facility];  Service: Vascular;  Laterality: Right;   WRIST SURGERY Right    x 2     Social History   Socioeconomic History   Marital status: Married    Spouse name: Not on file   Number of children: 6   Years of education: Not on file   Highest education level: Not on file  Occupational History   Occupation: Courtyard  Tobacco Use   Smoking status: Every Day    Current packs/day: 0.00    Types: Cigarettes    Last attempt to quit: 02/02/2022    Years since quitting: 2.4   Smokeless tobacco: Never   Tobacco comments:    Smoked 2 pkgs a day for 51 years  Vaping Use  Vaping status: Never Used  Substance and Sexual Activity   Alcohol use: Not Currently    Comment: occasional   Drug use: No   Sexual activity: Not Currently  Other Topics Concern   Not on file  Social History Narrative   Married for 46 years with 3 sons and 3 daughters. Smokes a ppd.    Social Drivers of Corporate investment banker Strain: Not on file  Food Insecurity: No Food Insecurity (11/27/2023)   Hunger Vital Sign    Worried About Running Out of Food in the Last Year: Never true    Ran Out of Food in the Last Year: Never true  Transportation Needs: No Transportation Needs (11/27/2023)   PRAPARE - Administrator, Civil Service (Medical): No    Lack of Transportation (Non-Medical): No  Physical Activity: Not on file   Stress: Not on file  Social Connections: Moderately Isolated (11/27/2023)   Social Connection and Isolation Panel    Frequency of Communication with Friends and Family: More than three times a week    Frequency of Social Gatherings with Friends and Family: Once a week    Attends Religious Services: Never    Database administrator or Organizations: No    Attends Banker Meetings: Never    Marital Status: Married  Catering manager Violence: Not At Risk (11/27/2023)   Humiliation, Afraid, Rape, and Kick questionnaire    Fear of Current or Ex-Partner: No    Emotionally Abused: No    Physically Abused: No    Sexually Abused: No    Family History  Problem Relation Age of Onset   Colon cancer Maternal Grandmother    Colon cancer Maternal Great-grandmother    Prostate cancer Neg Hx     Current Outpatient Medications  Medication Sig Dispense Refill   albuterol  (VENTOLIN  HFA) 108 (90 Base) MCG/ACT inhaler Inhale 1-2 puffs into the lungs every 6 (six) hours as needed for wheezing or shortness of breath.     aspirin  325 MG tablet Take 325 mg by mouth every evening.     atorvastatin  (LIPITOR ) 80 MG tablet Take 1 tablet (80 mg total) by mouth daily. 90 tablet 3   buPROPion  (WELLBUTRIN  XL) 150 MG 24 hr tablet Take 150 mg by mouth daily.     clopidogrel  (PLAVIX ) 75 MG tablet TAKE 1 TABLET BY MOUTH DAILY 90 tablet 3   ezetimibe  (ZETIA ) 10 MG tablet Take 1 tablet (10 mg total) by mouth daily. 30 tablet 11   ipratropium-albuterol  (DUONEB) 0.5-2.5 (3) MG/3ML SOLN Take 3 mLs by nebulization 2 (two) times daily. (Patient taking differently: Take 3 mLs by nebulization 2 (two) times daily as needed (SOB/Wheeezing).) 360 mL 0   JARDIANCE 25 MG TABS tablet Take 25 mg by mouth daily.     Multiple Vitamin (MULTIVITAMIN WITH MINERALS) TABS tablet Take 1 tablet by mouth every evening. Centrum Silver     oxymetazoline  (AFRIN) 0.05 % nasal spray Place 1 spray into both nostrils 2 (two) times daily as  needed for congestion.     No current facility-administered medications for this visit.    Allergies  Allergen Reactions   Neurontin [Gabapentin] Itching and Other (See Comments)    Confusion and sedation.   Celexa [Citalopram Hydrobromide]     restless leg/insomnia worse   Topamax [Topiramate]     numbness in hand     REVIEW OF SYSTEMS:  Negative unless noted in HPI [X]  denotes positive finding, [ ]   denotes negative finding Cardiac  Comments:  Chest pain or chest pressure:    Shortness of breath upon exertion:    Short of breath when lying flat:    Irregular heart rhythm:        Vascular    Pain in calf, thigh, or hip brought on by ambulation:    Pain in feet at night that wakes you up from your sleep:     Blood clot in your veins:    Leg swelling:         Pulmonary    Oxygen  at home:    Productive cough:     Wheezing:         Neurologic    Sudden weakness in arms or legs:     Sudden numbness in arms or legs:     Sudden onset of difficulty speaking or slurred speech:    Temporary loss of vision in one eye:     Problems with dizziness:         Gastrointestinal    Blood in stool:     Vomited blood:         Genitourinary    Burning when urinating:     Blood in urine:        Psychiatric    Major depression:         Hematologic    Bleeding problems:    Problems with blood clotting too easily:        Skin    Rashes or ulcers:        Constitutional    Fever or chills:      PHYSICAL EXAMINATION:  Vitals:   07/27/24 1443  BP: (!) 138/96  Pulse: 89  Resp: 18  Temp: 98.1 F (36.7 C)  TempSrc: Temporal  Weight: 222 lb 3.2 oz (100.8 kg)  Height: 5' 10 (1.778 m)    General:  WDWN in NAD; vital signs documented above Gait: Not observed HENT: WNL, normocephalic Pulmonary: normal non-labored breathing Cardiac: regular HR Abdomen: soft, NT, no masses Skin: without rashes Vascular Exam/Pulses: absent pedal pulses Extremities: without ischemic  changes, without Gangrene , without cellulitis; without open wounds;  Musculoskeletal: no muscle wasting or atrophy  Neurologic: A&O X 3 Psychiatric:  The pt has Normal affect.   Non-Invasive Vascular Imaging:   Occluded right femoral to AT bypass  ABI/TBIToday's ABIToday's TBIPrevious ABIPrevious TBI  +-------+-----------+-----------+------------+------------+  Right 0.54       0.41       0.90        0.91          +-------+-----------+-----------+------------+------------+  Left  1.02       1.09       0.93        0.82          +-------+-----------+-----------+------------+----------     ASSESSMENT/PLAN:: 69 y.o. male here for follow up for surveillance of PAD  Mr. Astorino is a 68 year old male with numerous revascularization procedures in the past especially of the right lower extremity.  He initially underwent femoral to below the knee popliteal bypass with PTFE.  He then required femoral to anterior tibial artery bypass after his prior bypass thrombosed.  He underwent endovascular interventions on the bypass earlier this year.  Unfortunately the right femoral to anterior tibial artery bypass is occluded by duplex today.  He is claudicating with cramping in his right calf after walking 300 feet.  He however is without rest pain or tissue loss currently.  He believes  he will be able to tolerate this level of claudication.  I encouraged him to walk for exercise on a daily basis to help promote collateral flow.  He will return in 3 months with an ABI to be evaluated by Dr. Gretta.  I have also encouraged smoking cessation.   Donnice Sender, PA-C [facility] [phone]  Clinic MD:   Magda

## 2024-07-29 ENCOUNTER — Other Ambulatory Visit: Payer: Self-pay | Admitting: *Deleted

## 2024-07-29 DIAGNOSIS — I739 Peripheral vascular disease, unspecified: Secondary | ICD-10-CM

## 2024-07-30 DIAGNOSIS — Z95828 Presence of other vascular implants and grafts: Secondary | ICD-10-CM | POA: Diagnosis not present

## 2024-07-30 DIAGNOSIS — F331 Major depressive disorder, recurrent, moderate: Secondary | ICD-10-CM | POA: Diagnosis not present

## 2024-07-30 DIAGNOSIS — I10 Essential (primary) hypertension: Secondary | ICD-10-CM | POA: Diagnosis not present

## 2024-07-30 DIAGNOSIS — I739 Peripheral vascular disease, unspecified: Secondary | ICD-10-CM | POA: Diagnosis not present

## 2024-07-30 DIAGNOSIS — G8929 Other chronic pain: Secondary | ICD-10-CM | POA: Diagnosis not present

## 2024-07-30 DIAGNOSIS — F17210 Nicotine dependence, cigarettes, uncomplicated: Secondary | ICD-10-CM | POA: Diagnosis not present

## 2024-07-30 DIAGNOSIS — E782 Mixed hyperlipidemia: Secondary | ICD-10-CM | POA: Diagnosis not present

## 2024-07-30 DIAGNOSIS — J449 Chronic obstructive pulmonary disease, unspecified: Secondary | ICD-10-CM | POA: Diagnosis not present

## 2024-07-30 DIAGNOSIS — G43909 Migraine, unspecified, not intractable, without status migrainosus: Secondary | ICD-10-CM | POA: Diagnosis not present

## 2024-07-30 DIAGNOSIS — Z8546 Personal history of malignant neoplasm of prostate: Secondary | ICD-10-CM | POA: Diagnosis not present

## 2024-07-30 DIAGNOSIS — Z08 Encounter for follow-up examination after completed treatment for malignant neoplasm: Secondary | ICD-10-CM | POA: Diagnosis not present

## 2024-07-30 DIAGNOSIS — Z1331 Encounter for screening for depression: Secondary | ICD-10-CM | POA: Diagnosis not present

## 2024-07-30 DIAGNOSIS — Z23 Encounter for immunization: Secondary | ICD-10-CM | POA: Diagnosis not present

## 2024-07-30 DIAGNOSIS — E1151 Type 2 diabetes mellitus with diabetic peripheral angiopathy without gangrene: Secondary | ICD-10-CM | POA: Diagnosis not present

## 2024-07-30 DIAGNOSIS — Z0000 Encounter for general adult medical examination without abnormal findings: Secondary | ICD-10-CM | POA: Diagnosis not present

## 2024-08-03 DIAGNOSIS — E1151 Type 2 diabetes mellitus with diabetic peripheral angiopathy without gangrene: Secondary | ICD-10-CM | POA: Diagnosis not present

## 2024-09-12 ENCOUNTER — Other Ambulatory Visit: Payer: Self-pay | Admitting: Vascular Surgery

## 2024-10-25 NOTE — Progress Notes (Unsigned)
 "   Patient name: Tyrone [name]. MRN: [id number] DOB: 1955/12/30 Sex: male  REASON FOR CONSULT: 48-month follow-up with ABIs  HPI: Tyrone [name]. is a 69 y.o. male, with history of COPD, hypertension, prostate cancer, tobacco abuse that presents for 3 month follow-up with ABIs for ongoing surveillance of his PAD.  Known occluded right leg bypass.  He has a very complicated history.  He was initially evaluated for severe PAD.  He  underwent a right common femoral endarterectomy with bovine patch and a right common femoral to below-knee popliteal bypass with PTFE on 03/06/2022 for CLI with rest pain.  He also previously had a left leg bypass on 10/24/2021 with a left common femoral endarterectomy and bovine patch and a left common femoral to below knee popliteal artery bypass with PTFE.     His right leg bypass ultimately occluded.  He underwent a redo right leg bypass on 11/26/2023 from the common femoral to the AT with vein.  This ultimately required percutaneous intervention with stenting on 03/18/2024.  Right leg bypass known to be occluded on last clinic visit from 07/27/2024.  Treated conservatively with limited options.  States he has had increasing right foot pain particularly when walking.  No tissue loss.  On aspirin  Plavix  today.     Past Medical History:  Diagnosis Date   Arthritis    hands, knees, back   COPD (chronic obstructive pulmonary disease) (HCC)    Depression    Diskitis 02/28/2021   Hardware complicating wound infection 02/28/2021   Headache    migraines   Hypertension    Legionella pneumonia (HCC) 02/28/2021   Neuromuscular disorder (HCC)    hands   Peripheral vascular disease    Pre-diabetes    Prostate cancer (HCC)    Smoker 11/05/2021   Vaccine counseling 11/05/2021    Past Surgical History:  Procedure Laterality Date   ABDOMINAL AORTOGRAM N/A 01/29/2024   Procedure: ABDOMINAL AORTOGRAM;  Surgeon: Gretta Lonni PARAS, MD;  Location: [facility];  Service: Cardiovascular;  Laterality: N/A;   ABDOMINAL AORTOGRAM N/A 03/18/2024   Procedure: ABDOMINAL AORTOGRAM;  Surgeon: Gretta Lonni PARAS, MD;  Location: [facility];  Service: Cardiovascular;  Laterality: N/A;   ABDOMINAL AORTOGRAM W/LOWER EXTREMITY Bilateral 10/18/2021   Procedure: ABDOMINAL AORTOGRAM W/LOWER EXTREMITY;  Surgeon: Gretta Lonni PARAS, MD;  Location: [facility];  Service: Cardiovascular;  Laterality: Bilateral;   ABDOMINAL AORTOGRAM W/LOWER EXTREMITY Right 02/28/2022   Procedure: ABDOMINAL AORTOGRAM W/LOWER EXTREMITY;  Surgeon: Gretta Lonni PARAS, MD;  Location: [facility];  Service: Cardiovascular;  Laterality: Right;   ABDOMINAL AORTOGRAM W/LOWER EXTREMITY Right 10/30/2023   Procedure: ABDOMINAL AORTOGRAM W/LOWER EXTREMITY;  Surgeon: Gretta Lonni PARAS, MD;  Location: [facility];  Service: Cardiovascular;  Laterality: Right;   APPENDECTOMY     BACK SURGERY  1980s   L4 to L 5 laminectomy   BYPASS GRAFT POPLITEAL TO TIBIAL Right 11/26/2023   Procedure: COMMON FEMORAL TO ANTERIOR TIBIAL ARTERY BYPASS USING GREATER SAPHENOUS VEIN;  Surgeon: Gretta Lonni PARAS, MD;  Location: [facility] OR;  Service: Vascular;  Laterality: Right;   COLONOSCOPY WITH PROPOFOL  N/A 02/06/2016   Procedure: COLONOSCOPY WITH PROPOFOL ;  Surgeon: Gladis MARLA Louder, MD;  Location: [facility];  Service: Endoscopy;  Laterality: N/A;   DIAGNOSTIC LAPAROSCOPY     ENDARTERECTOMY FEMORAL Left 10/24/2021   Procedure: ENDARTERECTOMY FEMORAL WITH PROFUNDAPLASTY;  Surgeon: Gretta Lonni PARAS, MD;  Location: [facility];  Service:  Vascular;  Laterality: Left;   ENDARTERECTOMY FEMORAL Right 03/06/2022   Procedure: RIGHT COMMON FEMORAL ENDARTERECTOMY;  Surgeon: Gretta Lonni PARAS, MD;  Location: [facility] OR;  Service: Vascular;  Laterality: Right;   FEMORAL-POPLITEAL BYPASS GRAFT Left 10/24/2021   Procedure: LEFT COMMON FEMORAL- BELOW KNEE POPLITEAL BYPASS;  Surgeon: Gretta Lonni PARAS,  MD;  Location: [facility] OR;  Service: Vascular;  Laterality: Left;   FEMORAL-POPLITEAL BYPASS GRAFT Right 03/06/2022   Procedure: RIGHT FEMORAL-POPLITEAL BYPASS WITH GORETEC GRAFT;  Surgeon: Gretta Lonni PARAS, MD;  Location: [facility];  Service: Vascular;  Laterality: Right;  INSERT ARTERIAL LINE   GROIN EXPOSURE Right 11/26/2023   Procedure: REDO RIGHT GROIN EXPOSURE;  Surgeon: Gretta Lonni PARAS, MD;  Location: [facility] OR;  Service: Vascular;  Laterality: Right;   LAPAROSCOPIC APPENDECTOMY N/A 08/13/2018   Procedure: APPENDECTOMY LAPAROSCOPIC;  Surgeon: Ethyl Lenis, MD;  Location: [facility];  Service: General;  Laterality: N/A;   LOWER EXTREMITY ANGIOGRAPHY N/A 01/29/2024   Procedure: Lower Extremity Angiography;  Surgeon: Gretta Lonni PARAS, MD;  Location: [facility] IN[facility] CV LAB;  Service: Cardiovascular;  Laterality: N/A;   LOWER EXTREMITY ANGIOGRAPHY N/A 03/18/2024   Procedure: Lower Extremity Angiography;  Surgeon: Gretta Lonni PARAS, MD;  Location: [facility] INVASIVE CV LAB;  Service: Cardiovascular;  Laterality: N/A;   LOWER EXTREMITY INTERVENTION N/A 01/29/2024   Procedure: LOWER EXTREMITY INTERVENTION;  Surgeon: Gretta Lonni PARAS, MD;  [facility]: [facility];  Service: Cardiovascular;  Laterality: N/A;   LOWER EXTREMITY INTERVENTION N/A 03/18/2024   Procedure: LOWER EXTREMITY INTERVENTION;  Surgeon: Gretta Lonni PARAS, MD;  Location: [facility];  Service: Cardiovascular;  Laterality: N/A;   PATCH ANGIOPLASTY Left 10/24/2021   Procedure: PATCH ANGIOPLASTY;  Surgeon: Gretta Lonni PARAS, MD;  Location: [facility] OR;  Service: Vascular;  Laterality: Left;   PELVIC LYMPH NODE DISSECTION Bilateral 02/08/2019   Procedure: PELVIC LYMPH NODE DISSECTION;  Surgeon: Carolee Sherwood JONETTA DOUGLAS, MD;  Location: [facility];  Service: Urology;  Laterality: Bilateral;   ROBOT ASSISTED LAPAROSCOPIC RADICAL PROSTATECTOMY N/A 02/08/2019   Procedure: XI ROBOTIC ASSISTED LAPAROSCOPIC RADICAL PROSTATECTOMY;  Surgeon: Carolee Sherwood JONETTA DOUGLAS,  MD;  Location: [facility];  Service: Urology;  Laterality: N/A;   TRANSFORAMINAL LUMBAR INTERBODY FUSION (TLIF) WITH PEDICLE SCREW FIXATION 1 LEVEL Left 09/12/2020   Procedure: LEFT-SIDED LUMBAR 1 - LUMBAR 2 TRANSFORAMINAL LUMBAR INTERBODY FUSION WITH INSTRUMENTATION AND ALLOGRAFT;  Surgeon: Beuford Anes, MD;  Location: [facility] OR;  Service: Orthopedics;  Laterality: Left;  3C BED   VEIN HARVEST Left 10/24/2021   Procedure: VEIN HARVEST;  Surgeon: Gretta Lonni PARAS, MD;  Location: [facility] OR;  Service: Vascular;  Laterality: Left;   VEIN HARVEST Right 11/26/2023   Procedure: VEIN HARVEST OF RIGHT GREATER SAPHANEOUS VEIN;  Surgeon: Gretta Lonni PARAS, MD;  Location: [facility] OR;  Service: Vascular;  Laterality: Right;   WRIST SURGERY Right    x 2     Family History  Problem Relation Age of Onset   Colon cancer Maternal Grandmother    Colon cancer Maternal Great-grandmother    Prostate cancer Neg Hx     SOCIAL HISTORY: Social History   Socioeconomic History   Marital status: Married    Spouse name: Not on file   Number of children: 6   Years of education: Not on file   Highest education level: Not on file  Occupational History   Occupation: Courtyard  Tobacco Use   Smoking status: Every Day    Current packs/day: 0.00  Average packs/day: 1.0 packs/day    Types: Cigarettes    Last attempt to quit: 02/02/2022    Years since quitting: 2.7   Smokeless tobacco: Never   Tobacco comments:    Smoked 2 pkgs a day for 51 years  Vaping Use   Vaping status: Never Used  Substance and Sexual Activity   Alcohol use: Not Currently    Comment: occasional   Drug use: No   Sexual activity: Not Currently  Other Topics Concern   Not on file  Social History Narrative   Married for 46 years with 3 sons and 3 daughters. Smokes a ppd.    Social Drivers of Health   Tobacco Use: High Risk (07/27/2024)   Patient History    Smoking Tobacco Use: Every Day    Smokeless Tobacco Use: Never    Passive  Exposure: Not on file  Financial Resource Strain: Not on file  Food Insecurity: No Food Insecurity (11/27/2023)   Hunger Vital Sign    Worried About Running Out of Food in the Last Year: Never true    Ran Out of Food in the Last Year: Never true  Transportation Needs: No Transportation Needs (11/27/2023)   PRAPARE - Administrator, Civil Service (Medical): No    Lack of Transportation (Non-Medical): No  Physical Activity: Not on file  Stress: Not on file  Social Connections: Moderately Isolated (11/27/2023)   Social Connection and Isolation Panel    Frequency of Communication with Friends and Family: More than three times a week    Frequency of Social Gatherings with Friends and Family: Once a week    Attends Religious Services: Never    Database Administrator or Organizations: No    Attends Banker Meetings: Never    Marital Status: Married  Catering Manager Violence: Not At Risk (11/27/2023)   Humiliation, Afraid, Rape, and Kick questionnaire    Fear of Current or Ex-Partner: No    Emotionally Abused: No    Physically Abused: No    Sexually Abused: No  Depression (PHQ2-9): Low Risk (11/05/2021)   Depression (PHQ2-9)    PHQ-2 Score: 0  Alcohol Screen: Not on file  Housing: Low Risk (11/27/2023)   Housing Stability Vital Sign    Unable to Pay for Housing in the Last Year: No    Number of Times Moved in the Last Year: 0    Homeless in the Last Year: No  Utilities: Not At Risk (11/27/2023)   [facility]HC Utilities    Threatened with loss of utilities: No  Health Literacy: Not on file    Allergies  Allergen Reactions   Neurontin [Gabapentin] Itching and Other (See Comments)    Confusion and sedation.   Celexa [Citalopram Hydrobromide]     restless leg/insomnia worse   Topamax [Topiramate]     numbness in hand    Current Outpatient Medications  Medication Sig Dispense Refill   albuterol  (VENTOLIN  HFA) 108 (90 Base) MCG/ACT inhaler Inhale 1-2 puffs into the  lungs every 6 (six) hours as needed for wheezing or shortness of breath.     aspirin  325 MG tablet Take 325 mg by mouth every evening.     atorvastatin  (LIPITOR ) 80 MG tablet Take 1 tablet (80 mg total) by mouth daily. 90 tablet 3   buPROPion  (WELLBUTRIN  XL) 150 MG 24 hr tablet Take 150 mg by mouth daily.     clopidogrel  (PLAVIX ) 75 MG tablet TAKE 1 TABLET BY MOUTH DAILY 90 tablet  3   ezetimibe  (ZETIA ) 10 MG tablet TAKE 1 TABLET BY MOUTH DAILY 90 tablet 0   ipratropium-albuterol  (DUONEB) 0.5-2.5 (3) MG/3ML SOLN Take 3 mLs by nebulization 2 (two) times daily. (Patient taking differently: Take 3 mLs by nebulization 2 (two) times daily as needed (SOB/Wheeezing).) 360 mL 0   JARDIANCE 25 MG TABS tablet Take 25 mg by mouth daily.     Multiple Vitamin (MULTIVITAMIN WITH MINERALS) TABS tablet Take 1 tablet by mouth every evening. Centrum Silver     oxymetazoline  (AFRIN) 0.05 % nasal spray Place 1 spray into both nostrils 2 (two) times daily as needed for congestion.     No current facility-administered medications for this visit.    REVIEW OF SYSTEMS:  [X]  denotes positive finding, [ ]  denotes negative finding Cardiac  Comments:  Chest pain or chest pressure:    Shortness of breath upon exertion:    Short of breath when lying flat:    Irregular heart rhythm:        Vascular    Pain in calf, thigh, or hip brought on by ambulation: x right  Pain in feet at night that wakes you up from your sleep:     Blood clot in your veins:    Leg swelling:         Pulmonary    Oxygen  at home:    Productive cough:     Wheezing:         Neurologic    Sudden weakness in arms or legs:     Sudden numbness in arms or legs:     Sudden onset of difficulty speaking or slurred speech:    Temporary loss of vision in one eye:     Problems with dizziness:         Gastrointestinal    Blood in stool:     Vomited blood:         Genitourinary    Burning when urinating:     Blood in urine:        Psychiatric     Major depression:         Hematologic    Bleeding problems:    Problems with blood clotting too easily:        Skin    Rashes or ulcers:        Constitutional    Fever or chills:      PHYSICAL EXAM: There were no vitals filed for this visit.   GENERAL: The patient is a well-nourished male, in no acute distress. The vital signs are documented above. CARDIAC: There is a regular rate and rhythm.  VASCULAR:  Bilateral femoral pulses palpable No palpable right pedal pulses Left DP palpable No tissue loss  DATA:   ABIs today are 0.52 right monophasic and 0.89 left triphasic    Assessment/Plan:  69 y.o. male, with history of COPD, hypertension, prostate cancer, tobacco abuse that presents for 3 month follow-up with ABIs for ongoing surveillance of his PAD.  Known occluded right leg bypass.  He has a very complicated history.  He was initially evaluated for severe PAD.  He  underwent a right common femoral endarterectomy with bovine patch and a right common femoral to below-knee popliteal bypass with PTFE on 03/06/2022 for CLI with rest pain.  He also previously had a left leg bypass on 10/24/2021 with a left common femoral endarterectomy and bovine patch and a left common femoral to below knee popliteal artery bypass with PTFE.  His right leg bypass ultimately occluded.  He underwent a redo right leg bypass on 11/26/2023 from the common femoral to the AT with vein.  This ultimately required percutaneous intervention with stenting on 03/18/2024.  Right leg bypass known to be occluded on last clinic visit from 07/27/2024.  Starting to have increasing right leg symptoms but still mostly claudication.  I did discuss I think he has limited options moving forward with 2 failed bypasses in the right leg and no vein for conduit.  I think maximal medical therapy would be his best option.  I will switch him from Plavix  to low-dose Xarelto  2-1/2 mg twice daily plus his aspirin .  I discussed  walking therapies.  Smoking cessation.  I will see him in 3 months with ABIs and left leg arterial duplex to look at his patent bypass in the left leg.  Certainly if he has continued worsening symptoms we can repeat angiogram but I think any intervention will be poorly durable.  Lonni DOROTHA Gaskins, MD [facility] Office: [phone]   "

## 2024-10-26 ENCOUNTER — Ambulatory Visit (HOSPITAL_COMMUNITY)
Admission: RE | Admit: 2024-10-26 | Discharge: 2024-10-26 | Disposition: A | Discharge status: Home / Self Care | Source: Ambulatory Visit | Attending: Physician Assistant | Admitting: Physician Assistant

## 2024-10-26 ENCOUNTER — Ambulatory Visit (INDEPENDENT_AMBULATORY_CARE_PROVIDER_SITE_OTHER): Admitting: Vascular Surgery

## 2024-10-26 ENCOUNTER — Encounter: Payer: Self-pay | Admitting: Vascular Surgery

## 2024-10-26 VITALS — BP 146/82 | HR 76 | Temp 97.9°F | Resp 22 | Ht 70.0 in | Wt 225.9 lb

## 2024-10-26 DIAGNOSIS — I70221 Atherosclerosis of native arteries of extremities with rest pain, right leg: Secondary | ICD-10-CM | POA: Insufficient documentation

## 2024-10-26 DIAGNOSIS — I70222 Atherosclerosis of native arteries of extremities with rest pain, left leg: Secondary | ICD-10-CM

## 2024-10-26 DIAGNOSIS — I739 Peripheral vascular disease, unspecified: Secondary | ICD-10-CM | POA: Insufficient documentation

## 2024-10-26 LAB — VAS US ABI WITH/WO TBI
Left ABI: 0.89
Right ABI: 0.52

## 2024-10-26 MED ORDER — RIVAROXABAN 2.5 MG PO TABS: 2.5000 mg | ORAL_TABLET | Freq: Two times a day (BID) | ORAL | 11 refills | Status: AC

## 2024-10-27 ENCOUNTER — Other Ambulatory Visit: Payer: Self-pay

## 2024-10-27 DIAGNOSIS — I70222 Atherosclerosis of native arteries of extremities with rest pain, left leg: Secondary | ICD-10-CM

## 2025-01-25 ENCOUNTER — Ambulatory Visit (HOSPITAL_COMMUNITY)

## 2025-01-25 ENCOUNTER — Ambulatory Visit: Admitting: Vascular Surgery
